# Patient Record
Sex: Female | Born: 1950 | Race: Black or African American | Hispanic: No | Smoking: Former smoker
Health system: Southern US, Community
[De-identification: ages and names within clinical notes are randomized; demographics above are authoritative.]

## PROBLEM LIST (undated history)

## (undated) DIAGNOSIS — E785 Hyperlipidemia, unspecified: Secondary | ICD-10-CM

## (undated) DIAGNOSIS — T7840XA Allergy, unspecified, initial encounter: Secondary | ICD-10-CM

## (undated) DIAGNOSIS — M1990 Unspecified osteoarthritis, unspecified site: Secondary | ICD-10-CM

## (undated) DIAGNOSIS — R011 Cardiac murmur, unspecified: Secondary | ICD-10-CM

## (undated) DIAGNOSIS — K219 Gastro-esophageal reflux disease without esophagitis: Secondary | ICD-10-CM

## (undated) DIAGNOSIS — Z862 Personal history of diseases of the blood and blood-forming organs and certain disorders involving the immune mechanism: Secondary | ICD-10-CM

## (undated) DIAGNOSIS — J45909 Unspecified asthma, uncomplicated: Secondary | ICD-10-CM

## (undated) DIAGNOSIS — I10 Essential (primary) hypertension: Secondary | ICD-10-CM

## (undated) DIAGNOSIS — Z8669 Personal history of other diseases of the nervous system and sense organs: Secondary | ICD-10-CM

## (undated) DIAGNOSIS — E119 Type 2 diabetes mellitus without complications: Secondary | ICD-10-CM

## (undated) DIAGNOSIS — J189 Pneumonia, unspecified organism: Secondary | ICD-10-CM

## (undated) HISTORY — DX: Essential (primary) hypertension: I10

## (undated) HISTORY — DX: Hyperlipidemia, unspecified: E78.5

## (undated) HISTORY — PX: COLONOSCOPY: SHX174

## (undated) HISTORY — DX: Unspecified asthma, uncomplicated: J45.909

## (undated) HISTORY — PX: HAND SURGERY: SHX662

## (undated) HISTORY — DX: Allergy, unspecified, initial encounter: T78.40XA

## (undated) HISTORY — DX: Unspecified osteoarthritis, unspecified site: M19.90

## (undated) HISTORY — PX: BILATERAL CARPAL TUNNEL RELEASE: SHX6508

## (undated) HISTORY — PX: TUBAL LIGATION: SHX77

---

## 1998-11-01 ENCOUNTER — Other Ambulatory Visit: Admission: RE | Admit: 1998-11-01 | Discharge: 1998-11-01 | Payer: Self-pay | Admitting: Internal Medicine

## 1998-11-29 ENCOUNTER — Encounter: Payer: Self-pay | Admitting: Internal Medicine

## 1998-11-29 ENCOUNTER — Encounter: Admission: RE | Admit: 1998-11-29 | Discharge: 1998-11-29 | Payer: Self-pay | Admitting: Internal Medicine

## 1999-01-03 ENCOUNTER — Encounter: Admission: RE | Admit: 1999-01-03 | Discharge: 1999-01-03 | Payer: Self-pay | Admitting: Internal Medicine

## 1999-01-03 ENCOUNTER — Encounter: Payer: Self-pay | Admitting: Internal Medicine

## 2000-12-09 ENCOUNTER — Other Ambulatory Visit: Admission: RE | Admit: 2000-12-09 | Discharge: 2000-12-09 | Payer: Self-pay

## 2000-12-11 ENCOUNTER — Encounter: Admission: RE | Admit: 2000-12-11 | Discharge: 2000-12-11 | Payer: Self-pay | Admitting: Internal Medicine

## 2000-12-11 ENCOUNTER — Encounter: Payer: Self-pay | Admitting: Internal Medicine

## 2002-01-14 ENCOUNTER — Encounter: Admission: RE | Admit: 2002-01-14 | Discharge: 2002-01-14 | Payer: Self-pay | Admitting: Internal Medicine

## 2002-01-14 ENCOUNTER — Encounter: Payer: Self-pay | Admitting: Internal Medicine

## 2002-02-09 ENCOUNTER — Ambulatory Visit (HOSPITAL_COMMUNITY): Admission: RE | Admit: 2002-02-09 | Discharge: 2002-02-09 | Payer: Self-pay | Admitting: Gastroenterology

## 2002-04-01 ENCOUNTER — Other Ambulatory Visit: Admission: RE | Admit: 2002-04-01 | Discharge: 2002-04-01 | Payer: Self-pay

## 2003-03-18 ENCOUNTER — Encounter: Admission: RE | Admit: 2003-03-18 | Discharge: 2003-03-18 | Payer: Self-pay | Admitting: Internal Medicine

## 2004-01-10 ENCOUNTER — Emergency Department (HOSPITAL_COMMUNITY): Admission: EM | Admit: 2004-01-10 | Discharge: 2004-01-11 | Payer: Self-pay | Admitting: Emergency Medicine

## 2004-05-16 ENCOUNTER — Encounter: Admission: RE | Admit: 2004-05-16 | Discharge: 2004-05-16 | Payer: Self-pay | Admitting: Internal Medicine

## 2004-08-29 ENCOUNTER — Other Ambulatory Visit: Admission: RE | Admit: 2004-08-29 | Discharge: 2004-08-29 | Payer: Self-pay | Admitting: Internal Medicine

## 2005-06-13 ENCOUNTER — Encounter: Admission: RE | Admit: 2005-06-13 | Discharge: 2005-06-13 | Payer: Self-pay | Admitting: Internal Medicine

## 2006-06-20 ENCOUNTER — Encounter: Admission: RE | Admit: 2006-06-20 | Discharge: 2006-06-20 | Payer: Self-pay | Admitting: Internal Medicine

## 2006-09-11 ENCOUNTER — Encounter: Admission: RE | Admit: 2006-09-11 | Discharge: 2006-09-11 | Payer: Self-pay | Admitting: Internal Medicine

## 2006-10-01 ENCOUNTER — Other Ambulatory Visit: Admission: RE | Admit: 2006-10-01 | Discharge: 2006-10-01 | Payer: Self-pay | Admitting: Obstetrics and Gynecology

## 2006-10-05 ENCOUNTER — Emergency Department (HOSPITAL_COMMUNITY): Admission: EM | Admit: 2006-10-05 | Discharge: 2006-10-06 | Payer: Self-pay | Admitting: Emergency Medicine

## 2006-12-02 ENCOUNTER — Ambulatory Visit (HOSPITAL_COMMUNITY): Admission: RE | Admit: 2006-12-02 | Discharge: 2006-12-02 | Payer: Self-pay | Admitting: Obstetrics and Gynecology

## 2006-12-02 ENCOUNTER — Encounter (INDEPENDENT_AMBULATORY_CARE_PROVIDER_SITE_OTHER): Payer: Self-pay | Admitting: Obstetrics and Gynecology

## 2008-01-06 ENCOUNTER — Encounter: Admission: RE | Admit: 2008-01-06 | Discharge: 2008-01-06 | Payer: Self-pay | Admitting: Internal Medicine

## 2008-06-19 ENCOUNTER — Emergency Department (HOSPITAL_COMMUNITY): Admission: EM | Admit: 2008-06-19 | Discharge: 2008-06-19 | Payer: Self-pay | Admitting: Emergency Medicine

## 2008-08-26 ENCOUNTER — Emergency Department (HOSPITAL_COMMUNITY): Admission: EM | Admit: 2008-08-26 | Discharge: 2008-08-26 | Payer: Self-pay | Admitting: Emergency Medicine

## 2009-01-26 ENCOUNTER — Encounter: Admission: RE | Admit: 2009-01-26 | Discharge: 2009-01-26 | Payer: Self-pay | Admitting: Internal Medicine

## 2010-01-27 ENCOUNTER — Encounter
Admission: RE | Admit: 2010-01-27 | Discharge: 2010-01-27 | Payer: Self-pay | Source: Home / Self Care | Attending: Internal Medicine | Admitting: Internal Medicine

## 2010-05-23 NOTE — Op Note (Signed)
NAME:  Isabella Oliver, ROA NO.:  [id number]   MEDICAL RECORD NO.:  [id number]          PATIENT TYPE:  AMB   LOCATION:  SDC                           FACILITY:  [facility]   PHYSICIAN:  Gerald Leitz, MD          DATE OF BIRTH:  [birth year]   DATE OF PROCEDURE:  12/02/2006  DATE OF DISCHARGE:                               OPERATIVE REPORT   PREOPERATIVE DIAGNOSIS:  Postmenopausal bleeding.   POSTOPERATIVE DIAGNOSIS:  Postmenopausal bleeding and endometrial  polyps.   PROCEDURE:  Hysteroscopy, polypectomy resection of endometrial polyp and  dilation and curettage.   SURGEON:  Dr. Gerald Leitz.   ASSISTANT:  None.   ANESTHESIA:  General.   FINDINGS:  Multiple endometrial polyps.   SPECIMEN:  Endometrial polyps and endometrial curettings.   DISPOSITION:  Specimen sent to pathology.   ESTIMATED BLOOD LOSS:  Minimal.   COMPLICATIONS:  None.   DISTENTION MEDIUM:  Sorbitol.   DEFICIT:  200 mL.   PROCEDURE:  The patient was taken to the operating room where she was  placed under general anesthesia.  She was placed in dorsal lithotomy  position.  She was then prepped and draped in the usual sterile fashion.  A bivalve speculum was placed into the vaginal vault.  The anterior lip  of the cervix was grasped with a single-tooth tenaculum.  The uterus was  sounded to 8.5 cm.  The cervix was dilated to #14 Hegar dilator.  Hysteroscope was inserted.  Multiple endometrial polyps were seen.  The  hysteroscope was removed.  Polypectomy forceps were inserted into the  uterus.  Multiple polyps were removed.  Hysteroscope was then  reinserted.  The patient was noted to have two remaining endometrial  polyps.  The hysteroscope was removed, the scope was then inserted and  the endometrial polyps were removed with a loop electrode.  No evidence  of uterine perforation was noted.  All instruments were removed from the  patient's vagina.  The patient was noted to have some bleeding from  the  single-tooth tenaculum site.  Silver nitrate was applied prior to  removing the tenaculum.  Quarter percent Marcaine was  injected at the 5 o'clock and 7 o'clock positions for postoperative  anesthesia.  The patient was awakened from anesthesia and taken to  recovery room awake and in stable condition.  Sponge, lap and needle  counts were correct x2.      Gerald Leitz, MD  Electronically Signed     TC/MEDQ  D:  12/02/2006  T:  12/02/2006  Job:  [id number]

## 2010-05-23 NOTE — H&P (Signed)
NAME:  Isabella Oliver, Isabella Oliver NO.:  [id number]   MEDICAL RECORD NO.:  [id number]          PATIENT TYPE:  AMB   LOCATION:  SDC                           [facility]:  [facility]   PHYSICIAN:  Gerald Leitz, MD          DATE OF BIRTH:  07-21-1950   DATE OF ADMISSION:  DATE OF DISCHARGE:                              HISTORY & PHYSICAL   HISTORY OF PRESENT ILLNESS:  This is a 60 year old G2, P2 with  postmenopausal bleeding.  She had a failed endometrial biopsy in the  office.  She had an ultrasound performed on October 09, 2006 that showed  the endometrium to be thick at 2.1 cm.  She has multiple uterine  fibroids.  The uterus measured 9.3 x 4.3 x 5.2 cm.  The left ovary  contained a complex cyst that was 1.3 cm and a simple cyst 1 cm,  both  avascular.  The right ovary was normal.   OB HISTORY:  Spontaneous vaginal delivery x2.   GYN HISTORY:  Menarche at the age of 51.  Contraception:  Tubal ligation  as well as postmenopausal.  The patient has been amenorrhea for several  years as well September 2008.   PAST MEDICAL HISTORY:  Hypertension.   PAST SURGICAL HISTORY:  Tubal ligation.   MEDICATIONS:  Lisinopril and hydrochlorothiazide.   ALLERGIES:  CODEINE causes vomiting.   SOCIAL HISTORY:  The patient is divorced.  She denies tobacco, alcohol  or illicit drug use.   FAMILY HISTORY:  Family history is positive for a grandmother with  breast cancer.  Negative for ovarian or colon cancer.   REVIEW OF SYSTEMS:  Review of systems is negative except as stated in  history of current illness.   PHYSICAL EXAMINATION:  VITAL SIGNS:  Blood pressure in the office today  is 158/84.  Weight 194 pounds.  CARDIOVASCULAR:  Regular rate and rhythm.  LUNGS:  Clear to auscultation bilaterally.  ABDOMEN:  Soft, nontender, nondistended.  No masses are appreciated.  EXTREMITIES:  No clubbing, cyanosis or edema.  PELVIC:  Normal external female genitalia.  No vulvar, vaginal, or  cervical  lesions are noted.  Bimanual exam reveals normal size uterus.  No adnexal masses or tenderness.   IMPRESSION AND PLAN:  1. Postmenopausal bleeding with thickened endometrium.  2. Failed endometrial biopsy in the office. Recommend hysteroscopy      dilatation and curettage.  Risks, benefits,      alternatives of surgery were discussed with the patient including      but not limited to infection, bleeding, possible uterine      perforation with the need for further surgery, risk of transfusion,      HIV, hepatitis B and C discussed.  The patient understands all      risks and desires to proceed with hysteroscopy dilation and      curettage.      Gerald Leitz, MD  Electronically Signed     TC/MEDQ  D:  11/28/2006  T:  11/29/2006  Job:  [id number]

## 2010-05-26 NOTE — Op Note (Signed)
   NAME:  Isabella Oliver, PEED                        ACCOUNT NO.:  [id number]   MEDICAL RECORD NO.:  [id number]                   PATIENT TYPE:  AMB   LOCATION:  ENDO                                 FACILITY:  [facility]   PHYSICIAN:  Danise Edge, M.D.                DATE OF BIRTH:  September 20, 1950   DATE OF PROCEDURE:  02/09/2002  DATE OF DISCHARGE:                                 OPERATIVE REPORT   REFERRING PHYSICIAN:  Dr. Kirby Funk.   PROCEDURE INDICATION:  Ms. [name] is a 60 year old female born  04-28-2050.  Ms. Tribbey underwent a colonoscopy five years ago and colon  polyps were removed.  She is scheduled to undergo a screening colonoscopy  with polypectomy to prevent colon cancer.   ENDOSCOPIST:  Danise Edge, M.D.   PREMEDICATION:  Versed 7.5 mg, Demerol 50 mg.   ENDOSCOPE:  Olympus adult colonoscope.   DESCRIPTION OF PROCEDURE:  After obtaining informed consent, [name] was  placed in the left lateral decubitus position.  I administered intravenous  Demerol and intravenous Versed to achieve conscious sedation for the  procedure.  The patient's blood pressure, oxygen saturation, and cardiac  rhythm were monitored throughout the procedure and documented in the medical  record.   Anal inspection was normal, digital rectal exam was normal and the Olympus  pediatric video colonoscope was introduced into the rectum and advanced to  the cecum.  The colonic preparation for the exam today was excellent.   Rectum normal.   Sigmoid colon and descending colon normal.   Splenic flexure normal.   Transverse colon normal.   Hepatic flexure normal.   Ascending colon normal.   Cecum and ileocecal valve normal.   ASSESSMENT:  Normal screening proctocolonoscopy to the cecum.    RECOMMENDATION:  Repeat colonoscopy in five years.                                                Danise Edge, M.D.    MJ/MEDQ  D:  02/09/2002  T:  02/09/2002  Job:  [id number]   cc:    Thora Lance, M.D.  [address]  [place]  [place] [address]  Fax: [phone]

## 2010-10-17 LAB — CBC
Hemoglobin: 12.2
MCV: 90.5
Platelets: 274
RBC: 3.93
RDW: 13.6

## 2010-10-17 LAB — URINALYSIS, ROUTINE W REFLEX MICROSCOPIC
Bilirubin Urine: NEGATIVE
Glucose, UA: NEGATIVE
Hgb urine dipstick: NEGATIVE
Nitrite: NEGATIVE

## 2010-10-17 LAB — DIFFERENTIAL
Basophils Absolute: 0
Basophils Relative: 0
Eosinophils Relative: 5
Lymphocytes Relative: 38
Neutro Abs: 3.4

## 2010-10-17 LAB — BASIC METABOLIC PANEL
Calcium: 10.1
Chloride: 103
Creatinine, Ser: 0.64
GFR calc non Af Amer: >60
Glucose, Bld: 90
Potassium: 4.2

## 2010-10-19 LAB — CBC
MCHC: 33.7
MCV: 90.6
RBC: 3.95
RDW: 13.5
WBC: 6.2

## 2010-10-19 LAB — I-STAT 8, (EC8 V) (CONVERTED LAB)
Bicarbonate: 30.5 — ABNORMAL HIGH
HCT: 39
Hemoglobin: 13.3
Potassium: 3.4 — ABNORMAL LOW
Sodium: 140
pH, Ven: 7.35 — ABNORMAL HIGH

## 2010-10-19 LAB — DIFFERENTIAL
Eosinophils Relative: 7 — ABNORMAL HIGH
Lymphs Abs: 2.1
Monocytes Relative: 8
Neutro Abs: 3.2

## 2010-10-19 LAB — POCT I-STAT CREATININE: Creatinine, Ser: 0.8

## 2010-12-20 ENCOUNTER — Other Ambulatory Visit (HOSPITAL_COMMUNITY)
Admission: RE | Admit: 2010-12-20 | Discharge: 2010-12-20 | Disposition: A | Discharge status: Home / Self Care | Payer: BC Managed Care – PPO | Source: Ambulatory Visit | Attending: Internal Medicine | Admitting: Internal Medicine

## 2010-12-20 ENCOUNTER — Other Ambulatory Visit: Payer: Self-pay | Admitting: Internal Medicine

## 2010-12-20 DIAGNOSIS — Z01419 Encounter for gynecological examination (general) (routine) without abnormal findings: Secondary | ICD-10-CM | POA: Insufficient documentation

## 2010-12-20 DIAGNOSIS — Z1159 Encounter for screening for other viral diseases: Secondary | ICD-10-CM | POA: Insufficient documentation

## 2011-01-09 ENCOUNTER — Ambulatory Visit (INDEPENDENT_AMBULATORY_CARE_PROVIDER_SITE_OTHER): Payer: BC Managed Care – PPO

## 2011-01-09 DIAGNOSIS — J209 Acute bronchitis, unspecified: Secondary | ICD-10-CM

## 2011-01-09 DIAGNOSIS — J029 Acute pharyngitis, unspecified: Secondary | ICD-10-CM

## 2011-01-09 DIAGNOSIS — R0602 Shortness of breath: Secondary | ICD-10-CM

## 2011-01-09 DIAGNOSIS — J45909 Unspecified asthma, uncomplicated: Secondary | ICD-10-CM

## 2011-01-26 ENCOUNTER — Ambulatory Visit (INDEPENDENT_AMBULATORY_CARE_PROVIDER_SITE_OTHER): Payer: BC Managed Care – PPO

## 2011-01-26 DIAGNOSIS — J40 Bronchitis, not specified as acute or chronic: Secondary | ICD-10-CM

## 2011-01-26 DIAGNOSIS — J9801 Acute bronchospasm: Secondary | ICD-10-CM

## 2011-01-26 DIAGNOSIS — R05 Cough: Secondary | ICD-10-CM

## 2011-02-01 ENCOUNTER — Other Ambulatory Visit: Payer: Self-pay | Admitting: Gastroenterology

## 2011-04-20 ENCOUNTER — Other Ambulatory Visit: Payer: Self-pay | Admitting: Internal Medicine

## 2011-04-20 DIAGNOSIS — Z1231 Encounter for screening mammogram for malignant neoplasm of breast: Secondary | ICD-10-CM

## 2011-05-25 ENCOUNTER — Ambulatory Visit
Admission: RE | Admit: 2011-05-25 | Discharge: 2011-05-25 | Disposition: A | Discharge status: Home / Self Care | Payer: BC Managed Care – PPO | Source: Ambulatory Visit | Attending: Internal Medicine | Admitting: Internal Medicine

## 2011-05-25 DIAGNOSIS — Z1231 Encounter for screening mammogram for malignant neoplasm of breast: Secondary | ICD-10-CM

## 2012-03-05 ENCOUNTER — Other Ambulatory Visit: Payer: Self-pay | Admitting: Radiology

## 2012-04-28 ENCOUNTER — Other Ambulatory Visit: Payer: Self-pay

## 2012-04-28 DIAGNOSIS — Z1231 Encounter for screening mammogram for malignant neoplasm of breast: Secondary | ICD-10-CM

## 2012-06-06 ENCOUNTER — Ambulatory Visit
Admission: RE | Admit: 2012-06-06 | Discharge: 2012-06-06 | Disposition: A | Discharge status: Home / Self Care | Payer: BC Managed Care – PPO | Source: Ambulatory Visit

## 2012-06-06 DIAGNOSIS — Z1231 Encounter for screening mammogram for malignant neoplasm of breast: Secondary | ICD-10-CM

## 2012-06-19 ENCOUNTER — Ambulatory Visit (INDEPENDENT_AMBULATORY_CARE_PROVIDER_SITE_OTHER): Payer: BC Managed Care – PPO | Admitting: Emergency Medicine

## 2012-06-19 ENCOUNTER — Ambulatory Visit: Payer: BC Managed Care – PPO

## 2012-06-19 VITALS — BP 148/52 | HR 76 | Temp 98.1°F | Resp 16 | Ht 62.5 in | Wt 204.6 lb

## 2012-06-19 DIAGNOSIS — R0602 Shortness of breath: Secondary | ICD-10-CM

## 2012-06-19 DIAGNOSIS — S46812A Strain of other muscles, fascia and tendons at shoulder and upper arm level, left arm, initial encounter: Secondary | ICD-10-CM

## 2012-06-19 DIAGNOSIS — S43499A Other sprain of unspecified shoulder joint, initial encounter: Secondary | ICD-10-CM

## 2012-06-19 DIAGNOSIS — M940 Chondrocostal junction syndrome [Tietze]: Secondary | ICD-10-CM

## 2012-06-19 MED ORDER — CYCLOBENZAPRINE HCL 10 MG PO TABS: 10.0000 mg | ORAL_TABLET | Freq: Three times a day (TID) | ORAL | Status: DC | PRN

## 2012-06-19 MED ORDER — HYDROCOD POLST-CHLORPHEN POLST 10-8 MG/5ML PO LQCR: 5.0000 mL | Freq: Two times a day (BID) | ORAL | Status: DC | PRN

## 2012-06-19 MED ORDER — MELOXICAM 15 MG PO TABS: 15.0000 mg | ORAL_TABLET | Freq: Every day | ORAL | Status: DC

## 2012-06-19 NOTE — Progress Notes (Signed)
[facility] [address], [place] [address] [phone]  Date:  06/19/2012   Name:  Isabella Oliver   DOB:  10/21/1950   MRN:  [id number]  PCP:  No primary provider on file.    Chief Complaint: Shortness of Breath, Back Pain and Headache   History of Present Illness:  Isabella Oliver is a 62 y.o. very pleasant female patient who presents with the following:  Shortness of breath since exposure to chemicals used by plumbers on Monday in the bathroom.  No cough, wheezing.  Has exertional shortness of breath that has persisted.  No nausea or vomiting.  Developed chest pain that radiates into the left shoulder and shoulder blade.  Says the pain lasted 2 1/2 hours last night and recurred today .  denies provocative or relieving factors.  No history of prior CAD.  No orthopnea or PND.  Non smoker (reformed) has HBP and no DM.  No improvement with over the counter medications or other home remedies. Denies other complaint or health concern today.   There are no active problems to display for this patient.   Past Medical History  Diagnosis Date  . Allergy   . Arthritis   . Asthma   . Hypertension     Past Surgical History  Procedure Laterality Date  . Tubal ligation      History  Substance Use Topics  . Smoking status: Never Smoker   . Smokeless tobacco: Not on file  . Alcohol Use: No    Family History  Problem Relation Age of Onset  . Lung disease Mother   . Hypertension Father   . Cancer Sister   . Diabetes Sister   . Diabetes Brother   . Heart disease Brother   . Cancer Maternal Grandmother   . Cancer Maternal Grandfather     Allergies  Allergen Reactions  . Codeine Nausea Only  . Methocarbamol Nausea Only  . Tramadol Nausea Only    Medication list has been reviewed and updated.  No current outpatient prescriptions on file prior to visit.   No current facility-administered medications on file prior to visit.    Review of  Systems:  As per HPI, otherwise negative.    Physical Examination: Filed Vitals:   06/19/12 1515  BP: 148/52  Pulse: 76  Temp: 98.1 F (36.7 C)  Resp: 16   Filed Vitals:   06/19/12 1515  Height: 5' 2.5" (1.588 m)  Weight: 204 lb 9.6 oz (92.806 kg)   Body mass index is 36.8 kg/(m^2). Ideal Body Weight: Weight in (lb) to have BMI = 25: 138.6  GEN: overweight, NAD, Non-toxic, A & O x 3 HEENT: Atraumatic, Normocephalic. Neck supple. No masses, No LAD. NECK:  Tender trapezius on left reproduces back pain. Ears and Nose: No external deformity. CV: RRR, No M/G/R. No JVD. No thrill. No extra heart sounds. PULM: CTA B, no wheezes, crackles, rhonchi. No retractions. No resp. distress. No accessory muscle use. CHEST:  Tender parasternal margin on left no crepitus.  Says this reproduces her pain. ABD: S, NT, ND, +BS. No rebound. No HSM. EXTR: No c/c/e NEURO Normal gait.  PSYCH: Normally interactive. Conversant. Not depressed or anxious appearing.  Calm demeanor.    Assessment and Plan: Costochondritis Trapezius strain mobic Flexeril vicodin  Signed,  Phillips Odor, MD   [facility] reading (PRIMARY) by  Dr. Dareen Piano.  Chest unremarkable.

## 2012-06-19 NOTE — Patient Instructions (Addendum)
Costochondritis Costochondritis (Tietze syndrome), or costochondral separation, is a swelling and irritation (inflammation) of the tissue (cartilage) that connects your ribs with your breastbone (sternum). It may occur on its own (spontaneously), through damage caused by an accident (trauma), or simply from coughing or minor exercise. It may take up to 6 weeks to get better and longer if you are unable to be conservative in your activities. HOME CARE INSTRUCTIONS   Avoid exhausting physical activity. Try not to strain your ribs during normal activity. This would include any activities using chest, belly (abdominal), and side muscles, especially if heavy weights are used.  Use ice for 15-20 minutes per hour while awake for the first 2 days. Place the ice in a plastic bag, and place a towel between the bag of ice and your skin.  Only take over-the-counter or prescription medicines for pain, discomfort, or fever as directed by your caregiver. SEEK IMMEDIATE MEDICAL CARE IF:   Your pain increases or you are very uncomfortable.  You have a fever.  You develop difficulty with your breathing.  You cough up blood.  You develop worse chest pains, shortness of breath, sweating, or vomiting.  You develop new, unexplained problems (symptoms). MAKE SURE YOU:   Understand these instructions.  Will watch your condition.  Will get help right away if you are not doing well or get worse. Document Released: 10/04/2004 Document Revised: 03/19/2011 Document Reviewed: 08/13/2007 ExitCare Patient Information 2014 [employer].  

## 2012-08-19 ENCOUNTER — Ambulatory Visit: Payer: BC Managed Care – PPO

## 2012-08-19 ENCOUNTER — Ambulatory Visit (INDEPENDENT_AMBULATORY_CARE_PROVIDER_SITE_OTHER): Payer: BC Managed Care – PPO | Admitting: Emergency Medicine

## 2012-08-19 VITALS — BP 136/82 | HR 56 | Temp 98.0°F | Resp 20 | Ht 62.5 in | Wt 198.0 lb

## 2012-08-19 DIAGNOSIS — M25562 Pain in left knee: Secondary | ICD-10-CM

## 2012-08-19 DIAGNOSIS — M25569 Pain in unspecified knee: Secondary | ICD-10-CM

## 2012-08-19 MED ORDER — MELOXICAM 15 MG PO TABS: 15.0000 mg | ORAL_TABLET | Freq: Every day | ORAL | Status: DC

## 2012-08-19 MED ORDER — HYDROCODONE-ACETAMINOPHEN 5-325 MG PO TABS: 1.0000 | ORAL_TABLET | ORAL | Status: DC | PRN

## 2012-08-19 NOTE — Progress Notes (Signed)
[facility] [address] [place] [address] [phone]  Date:  08/19/2012   Name:  Isabella Oliver   DOB:  Jan 27, 1950   MRN:  [id number]  PCP:  No primary provider on file.    Chief Complaint: Knee Pain   History of Present Illness:  MICHAEL VENTRESCA is a 62 y.o. very pleasant female patient who presents with the following:  Says that she was in the shower and lifted her right leg to wash and her left knee "gave way" and she fell into the tub.  She says she did not hit her head or have a LOC.  No neuro or visual symptoms.  No syncope.  Did not strike her left knee.  She is being evaluated for arthritis by her FMD and he has discussed referral with her to a rheumatologist. No improvement with over the counter medications or other home remedies. Denies other complaint or health concern today.   She fell last night.    There are no active problems to display for this patient.   Past Medical History  Diagnosis Date  . Allergy   . Arthritis   . Asthma   . Hypertension     Past Surgical History  Procedure Laterality Date  . Tubal ligation      History  Substance Use Topics  . Smoking status: Never Smoker   . Smokeless tobacco: Not on file  . Alcohol Use: No    Family History  Problem Relation Age of Onset  . Lung disease Mother   . Hypertension Father   . Cancer Sister   . Diabetes Sister   . Diabetes Brother   . Heart disease Brother   . Cancer Maternal Grandmother   . Cancer Maternal Grandfather     Allergies  Allergen Reactions  . Codeine Nausea Only  . Methocarbamol Nausea Only  . Tramadol Nausea Only    Medication list has been reviewed and updated.  Current Outpatient Prescriptions on File Prior to Visit  Medication Sig Dispense Refill  . amLODipine (NORVASC) 10 MG tablet Take 10 mg by mouth daily.      Marland Kitchen aspirin 81 MG tablet Take 81 mg by mouth daily.      Marland Kitchen VALSARTAN PO Take 1 tablet by mouth daily.      .  chlorpheniramine-HYDROcodone (TUSSIONEX PENNKINETIC ER) 10-8 MG/5ML LQCR Take 5 mLs by mouth every 12 (twelve) hours as needed.  60 mL  0  . cyclobenzaprine (FLEXERIL) 10 MG tablet Take 1 tablet (10 mg total) by mouth 3 (three) times daily as needed for muscle spasms.  30 tablet  0  . meloxicam (MOBIC) 15 MG tablet Take 1 tablet (15 mg total) by mouth daily.  30 tablet  1   No current facility-administered medications on file prior to visit.    Review of Systems:  As per HPI, otherwise negative.    Physical Examination: Filed Vitals:   08/19/12 1024  BP: 136/82  Pulse: 56  Temp: 98 F (36.7 C)  Resp: 20   Filed Vitals:   08/19/12 1024  Height: 5' 2.5" (1.588 m)  Weight: 198 lb (89.812 kg)   Body mass index is 35.62 kg/(m^2). Ideal Body Weight: Weight in (lb) to have BMI = 25: 138.6   GEN: WDWN, NAD, Non-toxic, Alert & Oriented x 3 HEENT: Atraumatic, Normocephalic.  Ears and Nose: No external deformity. EXTR: No clubbing/cyanosis/edema NEURO: antalgic assisted gait.  PSYCH: Normally interactive. Conversant. Not  depressed or anxious appearing.  Calm demeanor.  LEFT knee:  Little effusion.  No deformity or ecchymosis.  Little focal tender. Joint stable.    Assessment and Plan: Knee pain Crutches mobic Ice and elevation   Signed,  Phillips Odor, MD   [facility] reading (PRIMARY) by  Dr. Dareen Piano.  DJD.

## 2012-08-19 NOTE — Patient Instructions (Signed)
Wear and Tear Disorders of the Knee (Arthritis, Osteoarthritis)  Everyone will experience wear and tear injuries (arthritis, osteoarthritis) of the knee. These are the changes we all get as we age. They come from the joint stress of daily living. The amount of cartilage damage in your knee and your symptoms determine if you need surgery. Mild problems require approximately two months recovery time. More severe problems take several months to recover. With mild problems, your surgeon may find worn and rough cartilage surfaces. With severe changes, your surgeon may find cartilage that has completely worn away and exposed the bone. Loose bodies of bone and cartilage, bone spurs (excess bone growth), and injuries to the menisci (cushions between the large bones of your leg) are also common. All of these problems can cause pain.  For a mild wear and tear problem, rough cartilage may simply need to be shaved and smoothed. For more severe problems with areas of exposed bone, your surgeon may use an instrument for roughing up the bone surfaces to stimulate new cartilage growth. Loose bodies are usually removed. Torn menisci may be trimmed or repaired.  ABOUT THE ARTHROSCOPIC PROCEDURE  Arthroscopy is a surgical technique. It allows your orthopedic surgeon to diagnose and treat your knee injury with accuracy. The surgeon looks into your knee through a small scope. The scope is like a small (pencil-sized) telescope. Arthroscopy is less invasive than open knee surgery. You can expect a more rapid recovery. After the procedure, you will be moved to a recovery area until most of the effects of the medication have worn off. Your caregiver will discuss the test results with you.  RECOVERY  The severity of the arthritis and the type of procedure performed will determine recovery time. Other important factors include age, physical condition, medical conditions, and the type of rehabilitation program. Strengthening your muscles after  arthroscopy helps guarantee a better recovery. Follow your caregiver's instructions. Use crutches, rest, elevate, ice, and do knee exercises as instructed. Your caregivers will help you and instruct you with exercises and other physical therapy required to regain your mobility, muscle strength, and functioning following surgery. Only take over-the-counter or prescription medicines for pain, discomfort, or fever as directed by your caregiver.   SEEK MEDICAL CARE IF:   · There is increased bleeding (more than a small spot) from the wound.  · You notice redness, swelling, or increasing pain in the wound.  · Pus is coming from wound.  · You develop an unexplained oral temperature above 102° F (38.9° C) , or as your caregiver suggests.  · You notice a foul smell coming from the wound or dressing.  · You have severe pain with motion of the knee.  SEEK IMMEDIATE MEDICAL CARE IF:   · You develop a rash.  · You have difficulty breathing.  · You have any allergic problems.  MAKE SURE YOU:   · Understand these instructions.  · Will watch your condition.  · Will get help right away if you are not doing well or get worse.  Document Released: 12/23/1999 Document Revised: 03/19/2011 Document Reviewed: 05/21/2007  ExitCare® Patient Information ©2014 [employer].

## 2012-08-19 NOTE — Progress Notes (Signed)
  Subjective:    Patient ID: Isabella Oliver, female    DOB: 1950-10-29, 62 y.o.   MRN: [id number]  HPI  62 YO female presents to the clinic today with complaints of left knee pain. Pt was in the shower last night and felt the left knee give way. She fell into the tub. She did not hit her head nor lose consciousness. She does not recall hitting her knees. This morning she has pain in her left knee. Unable to bear weight. Unable to walk.   Patient has a history of knee pains. She was going to see her PCP- Dr. Valentina Lucks for evaluation. She has not scheduled this appt as of today's visit.  Review of Systems     Objective:   Physical Exam        Assessment & Plan:  Xray Left Knee

## 2012-08-25 ENCOUNTER — Encounter: Payer: Self-pay | Admitting: Radiology

## 2012-08-26 ENCOUNTER — Encounter: Payer: BC Managed Care – PPO | Attending: Internal Medicine | Admitting: Dietician

## 2012-08-26 ENCOUNTER — Encounter: Payer: Self-pay | Admitting: Dietician

## 2012-08-26 VITALS — Ht 62.5 in | Wt 200.4 lb

## 2012-08-26 DIAGNOSIS — E669 Obesity, unspecified: Secondary | ICD-10-CM

## 2012-08-26 DIAGNOSIS — Z713 Dietary counseling and surveillance: Secondary | ICD-10-CM | POA: Insufficient documentation

## 2012-08-26 DIAGNOSIS — E119 Type 2 diabetes mellitus without complications: Secondary | ICD-10-CM | POA: Insufficient documentation

## 2012-08-26 NOTE — Progress Notes (Signed)
  Medical Nutrition Therapy:  Appt start time: 0800 end time:  0900.   Assessment:  Primary concerns today: Doctor recommend that Isabella Oliver talked to a a dietitian about pre-diabetes. Patient was told she was on the border of having diabetes and recent A1C values were 6.9 and 7.2%.  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 States that she does her grocery shopping and food preparation and is not currently exercising. Also states that she gets very tired after eating and usually goes to sleep after eating.   MEDICATIONS: See list   DIETARY INTAKE:  Usual eating pattern includes 2 meals and 2 snacks per day.  Avoided foods include cucumbers, bananas.    24-hr recall:  B ( AM): sandwich with bologna or spam and tomato soup with coffee with cream and sugar and water  Snk ( AM): none  L ( 3-4PM): smoked neck bones, black eyed peas, and cornbread OR chicken with soda or lemonade Snk ( PM): none D ( PM): none Snk (1 AM-5:30 AM):bag of chips and candy bar Beverages: water and gatorade  Usual physical activity: none  Estimated energy needs: 1600 calories 180 g carbohydrates 120 g protein 44 g fat  Progress Towards Goal(s):  In progress.   Nutritional Diagnosis:  NB-1.1 Food and nutrition-related knowledge deficit As related to high salt, high fat, and energy dense food choices.  As evidenced by BMI of 36.07 and impaired fasting glucose.    Intervention:  Nutrition counseling provided. Briefly described the pathophysiology of insulin resistance/Type 2 diabetes and explained the importance of spreading out carbohydrates throughout the day. Also explained the role of physical activity in reducing blood glucose levels. Advised her to fill half of her plate with vegetables, reduce her intake of processed meats, and eat snacks with protein and CHO between meals to keep blood glucose level and reduce overeating at meals.   Contacted [name]'s primary care doctor in  order to get a prescription for glucose monitoring. Santanna will contact [facility] to make a 30 minute appointment to be trained on BGM.   Plan: Eat every 3-5 hours that you are awake.  Have snacks with protein and carbohydrates.  Switch drinks like soda and lemonade to sugar-free or water. Try to get at least 10 minutes of exercise in each day. Fill half of your plates with vegetables, limit starches to a quarter of the plate. Choose meats that are less processed, lower in sodium, and lower in fat.  Handouts given during visit include:  Living Well With Diabetes  MyPlate Handout  16X CHO  Monitoring/Evaluation:  Dietary intake, exercise, and body weight in 4 week(s).

## 2012-08-26 NOTE — Patient Instructions (Addendum)
Eat every 3-5 hours that you are awake.  Have snacks with protein and carbohydrates.  Switch drinks like soda and lemonade to sugar-free or water. Try to get at least 10 minutes of exercise in each day. Fill half of your plates with vegetables, limit starches to a quarter of the plate. Choose meats that are less processed, lower in sodium, and lower in fat.

## 2012-09-01 ENCOUNTER — Encounter: Payer: BC Managed Care – PPO | Admitting: *Deleted

## 2012-09-01 DIAGNOSIS — E119 Type 2 diabetes mellitus without complications: Secondary | ICD-10-CM

## 2012-09-01 NOTE — Patient Instructions (Addendum)
Plan: Call your MD for Rx for AccuChek Smart View strips and Accu Chek Fast Clix Drums Consider checking your BG once a day for now, and alternate between pre and post meals

## 2012-09-01 NOTE — Progress Notes (Signed)
Blood Glucose Monitoring Instruction  Appointment Start Time: 0830 Appointment End Time: 0900  Assessment:  Primary concerns today: Patient here for instruction on Blood Glucose Monitoring. They do not have their own meter at this time.  Meter Provided:   Yes  If Yes, Brand: Accu Chek Nano BG Monitoring Kit Lot #: [id number] Expiration Date:12/07/13  Medications: see list     Intervention:    Explained rationale of testing BG to obtain data as to how their diabetes is being managed.  Provided Target Ranges for both pre and post meals  Explained factors that effect BG including food (carbohydrate), stress, activity level and insulin availability in the body including diabetes medications  Taught patient techniques for using BG monitor and lancing device  Discussed need for Rx for strips and lancets   Explained rationale of recording BG both for patient and MD to assess patterns as needed.  Plan: Call your MD for Rx for AccuChek Smart View strips and Accu Chek Fast Clix Drums Consider checking your BG once a day for now, and alternate between pre and post meals   Follow Up: Patient offered follow up as needed

## 2012-09-22 ENCOUNTER — Encounter: Payer: BC Managed Care – PPO | Attending: Internal Medicine | Admitting: Dietician

## 2012-09-22 VITALS — Ht 62.5 in | Wt 197.5 lb

## 2012-09-22 DIAGNOSIS — E119 Type 2 diabetes mellitus without complications: Secondary | ICD-10-CM | POA: Insufficient documentation

## 2012-09-22 DIAGNOSIS — Z713 Dietary counseling and surveillance: Secondary | ICD-10-CM | POA: Insufficient documentation

## 2012-09-22 DIAGNOSIS — E669 Obesity, unspecified: Secondary | ICD-10-CM

## 2012-09-22 NOTE — Progress Notes (Signed)
  Medical Nutrition Therapy:  Appt start time: 0800 end time:  0900.   Assessment:  Isabella Oliver is here today for a follow up for pre-diabetes and lost 2.5 pounds since her last visit. She is now testing her blood glucose about 1 x day, though she skipped some days recently since she had trouble changing the lancet on her meter. Average blood sugar is 130 fasting.   Having one soda per day at dinner and drinks G2 gatorade (20z) per day. Using small plates and trying to fill half of your plate with vegetables. Plans to start walking at the track this week.    Wt Readings from Last 3 Encounters:  09/22/12 197 lb 8 oz (89.585 kg)  08/26/12 200 lb 6.4 oz (90.901 kg)  08/19/12 198 lb (89.812 kg)   Ht Readings from Last 3 Encounters:  09/22/12 5' 2.5" (1.588 m)  08/26/12 5' 2.5" (1.588 m)  08/19/12 5' 2.5" (1.588 m)   Body mass index is 35.53 kg/(m^2). @BMIFA @ Normalized weight-for-age data available only for age 91 to 20 years. Normalized stature-for-age data available only for age 91 to 20 years.  MEDICATIONS: See list   DIETARY INTAKE:  Usual eating pattern includes 2 meals and 2 snacks per day.  Avoided foods include cucumbers, bananas.    24-hr recall:  B ( AM):salad with spam and cheese and bacon bits and macaroni OR sandwich with bologna or spam and tomato soup with coffee with cream and sugar (not everyday) and water  Snk ( AM): none  L ( 5 PM): chicken pie and mixed greens with peach cobbler Or cabbage with fat back and cornbread with soda Snk ( PM): none Snk (1 AM-5:30 AM):grapes AND  crackers and peanut butter Or chips Beverages: water and gatorade and one soda per day  Usual physical activity: none  Estimated energy needs: 1600 calories 180 g carbohydrates 120 g protein 44 g fat  Progress Towards Goal(s):  In progress.   Nutritional Diagnosis:  NB-1.1 Food and nutrition-related knowledge deficit As related to high salt, high fat, and energy dense food choices.  As  evidenced by BMI of 36.07 and impaired fasting glucose.    Intervention:  Nutrition counseling provided. Encouraged Isabella Oliver to keep up the good work from last time, check blood sugar at different times of the day, and work on incorporating exercise into her routine.   Plan: Check your blood sugar at different throughout the week and write down what you ate or if anything else that may have affected blood sugar.  Eat every 3-5 hours that you are awake if you are hungry Have snacks with protein and carbohydrates.  Switch drinks like soda and lemonade to sugar-free or water. Try to get at least 10 minutes of exercise in each day. (try the track after school) Continue fill half of your plates with vegetables, limit starches to a quarter of the plate. Choose meats that are less processed, lower in sodium, and lower in fat.   Monitoring/Evaluation:  Dietary intake, exercise, and body weight in 2 month(s).

## 2012-09-22 NOTE — Patient Instructions (Addendum)
Check your blood sugar at different throughout the week and write down what you ate or if anything else that may have affected blood sugar.  Eat every 3-5 hours that you are awake if you are hungry Have snacks with protein and carbohydrates.  Switch drinks like soda and lemonade to sugar-free or water. Try to get at least 10 minutes of exercise in each day. (try the track after school) Continue fill half of your plates with vegetables, limit starches to a quarter of the plate. Choose meats that are less processed, lower in sodium, and lower in fat.

## 2012-09-23 ENCOUNTER — Ambulatory Visit: Payer: BC Managed Care – PPO | Admitting: Dietician

## 2012-11-03 IMAGING — CR DG KNEE*R* EXAM
4 series · 4 of 4 positions shown · non-contrast
Comparison: none

[AP]
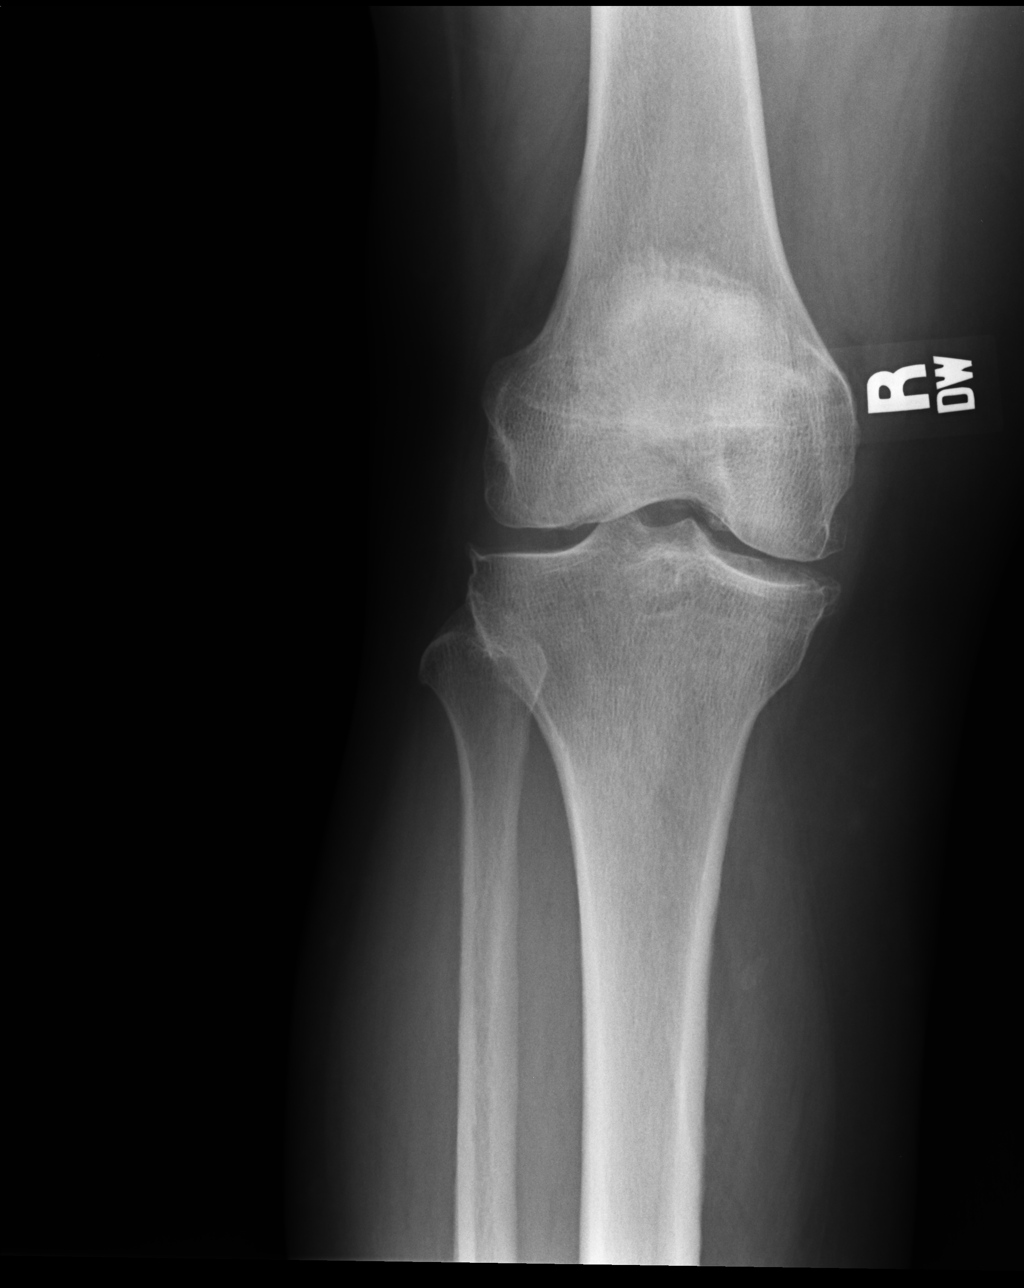

[lateral]
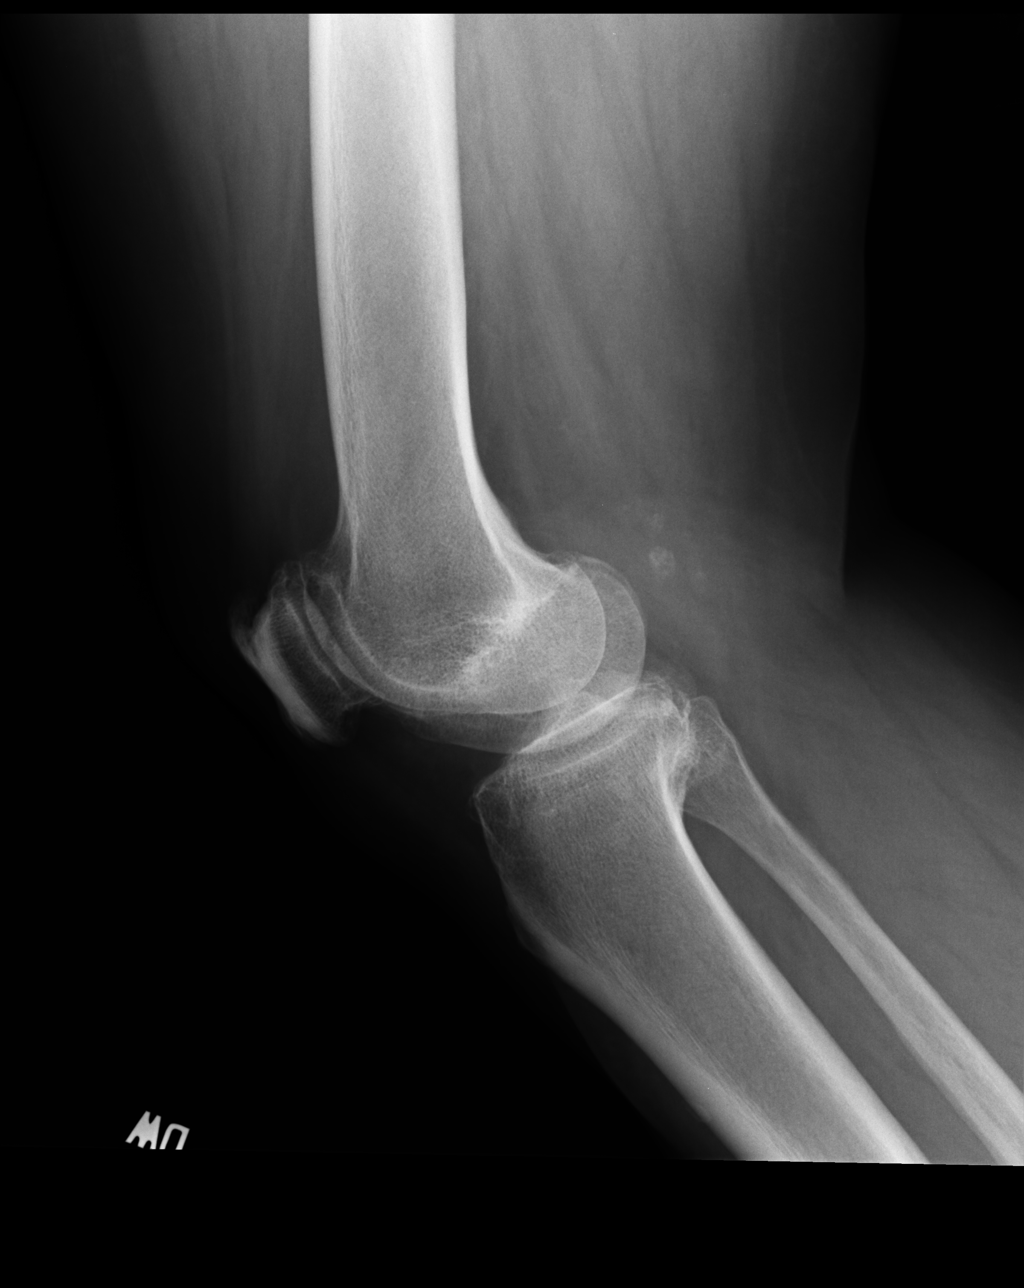

[pa axial]
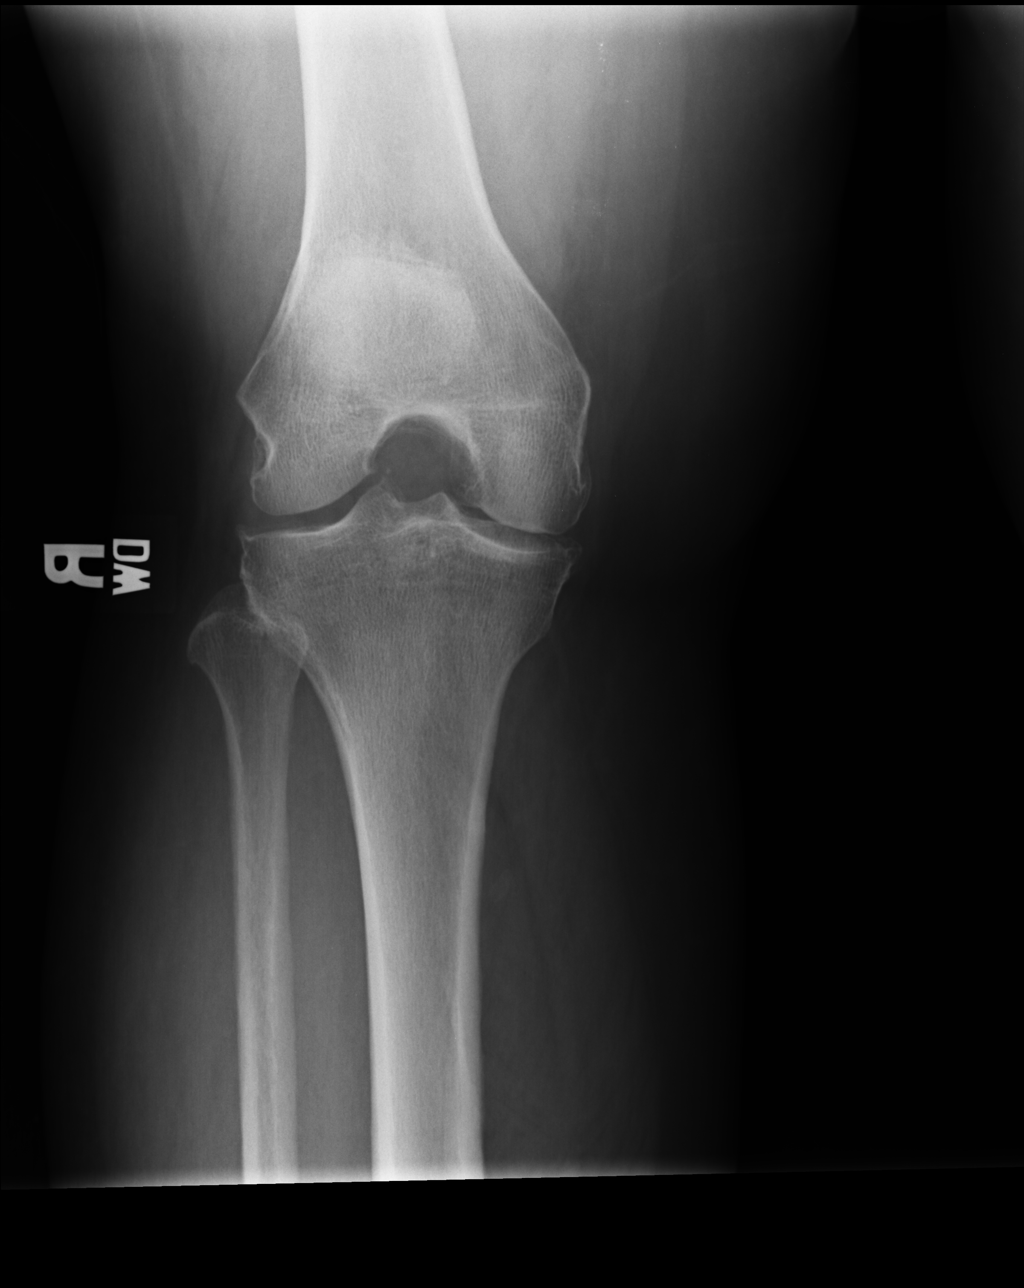

[sunrise]
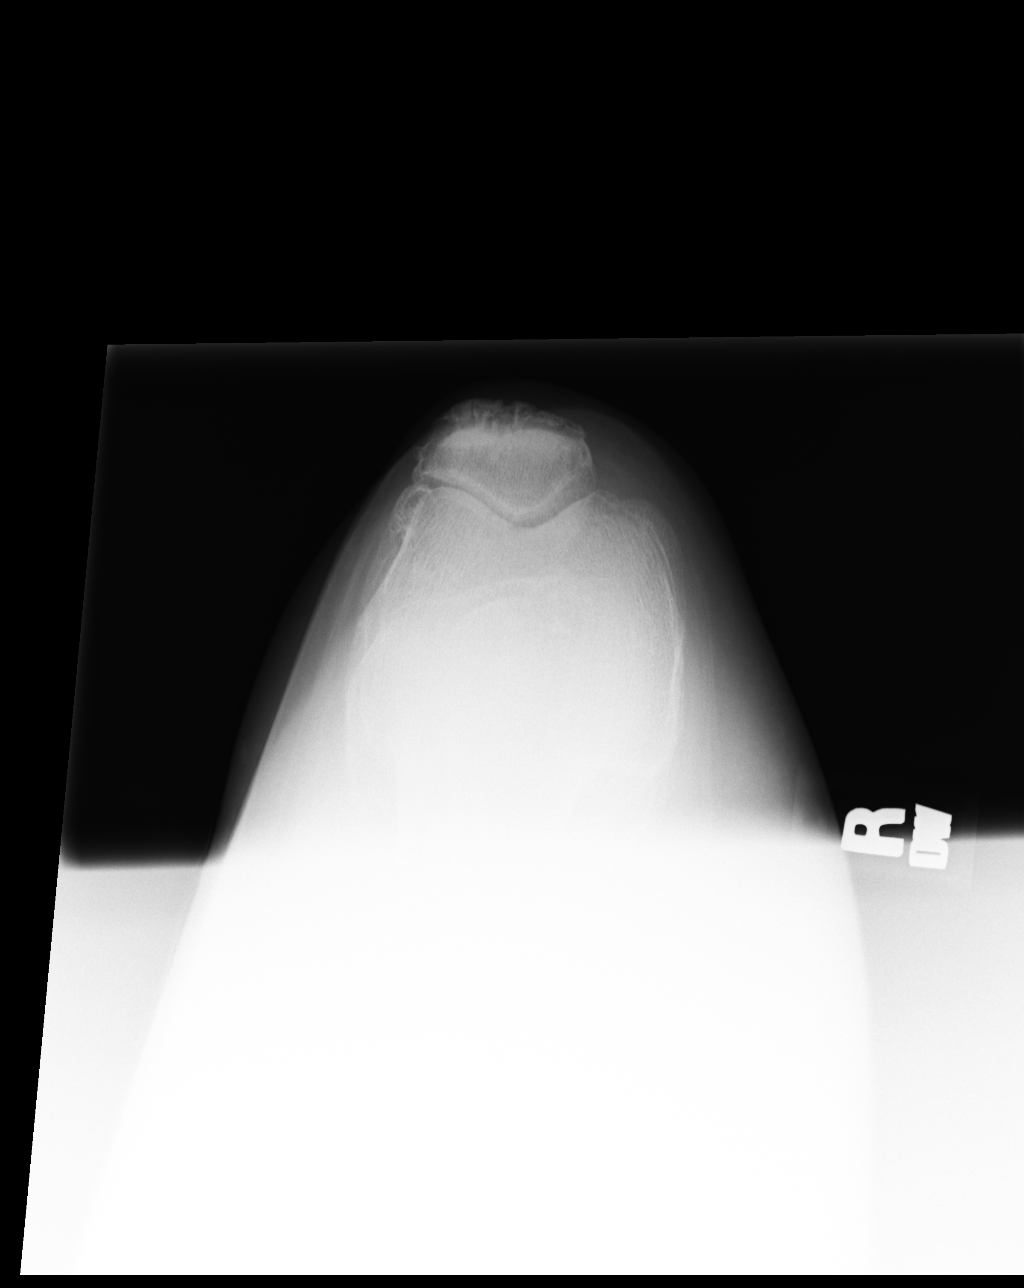

[4 of 4 positions shown; findings below may reference images not displayed]

Canned report from images found in remote index.

Refer to host system for actual result text.

## 2012-11-24 ENCOUNTER — Encounter: Payer: BC Managed Care – PPO | Attending: Internal Medicine | Admitting: Dietician

## 2012-11-24 VITALS — Ht 62.5 in | Wt 195.2 lb

## 2012-11-24 DIAGNOSIS — E119 Type 2 diabetes mellitus without complications: Secondary | ICD-10-CM | POA: Insufficient documentation

## 2012-11-24 DIAGNOSIS — Z713 Dietary counseling and surveillance: Secondary | ICD-10-CM | POA: Insufficient documentation

## 2012-11-24 NOTE — Patient Instructions (Signed)
Check your blood sugar at different throughout the week and write down what you ate or if anything else that may have affected blood sugar.  Eat every 3-5 hours that you are awake if you are hungry Have snacks with protein and carbohydrates.  Switch drinks like soda and lemonade to sugar-free or water. Try to get at least 10 minutes of exercise in each day. (try the track after school) Continue fill half of your plates with vegetables, limit starches to a quarter of the plate. Choose meats that are less processed, lower in sodium, and lower in fat.

## 2012-11-24 NOTE — Progress Notes (Signed)
  Medical Nutrition Therapy:  Appt start time: 0830 end time:  0900.  Assessment:  Isabella Oliver is here today for a follow up for pre-diabetes. Lost 2 pounds since last visit. Unable to exercise d/t arthritis. Not going back for seconds and thirds like before, though eating most of the same foods. Got a new meter from the pharmacy and has had trouble work it so she hasn't been able to test.     Wt Readings from Last 3 Encounters:  11/24/12 195 lb 3.2 oz (88.542 kg)  09/22/12 197 lb 8 oz (89.585 kg)  08/26/12 200 lb 6.4 oz (90.901 kg)   Ht Readings from Last 3 Encounters:  11/24/12 5' 2.5" (1.588 m)  09/22/12 5' 2.5" (1.588 m)  08/26/12 5' 2.5" (1.588 m)   Body mass index is 35.11 kg/(m^2). @BMIFA @ Normalized weight-for-age data available only for age 69 to 20 years. Normalized stature-for-age data available only for age 69 to 20 years.  MEDICATIONS: See list   DIETARY INTAKE:  Usual eating pattern includes 2 meals and 2 snacks per day.  Avoided foods include cucumbers, bananas.    24-hr recall:  B ( AM):salad with spam and cheese and bacon bits and macaroni OR sandwich with bologna or spam and tomato soup with coffee with cream and sugar (not everyday) and water  Snk ( AM): none  L ( 5 PM): chicken pie and mixed greens with peach cobbler Or cabbage with fat back and cornbread with soda Snk ( PM): none Snk (1 AM-5:30 AM):grapes AND  crackers and peanut butter Or chips Beverages: water and gatorade and one soda per day  Usual physical activity: none  Estimated energy needs: 1600 calories 180 g carbohydrates 120 g protein 44 g fat  Progress Towards Goal(s):  In progress.   Nutritional Diagnosis:  NB-1.1 Food and nutrition-related knowledge deficit As related to high salt, high fat, and energy dense food choices.  As evidenced by BMI of 36.07 and impaired fasting glucose.    Intervention:  Nutrition counseling provided. Provided instruction on how to work the new meter,  encouraged Shaeley to work on adding exercise, eating more frequently, and get back to testing her blood sugar regularly.   Plan: Check your blood sugar at different throughout the week and write down what you ate or if anything else that may have affected blood sugar.  Eat every 3-5 hours that you are awake if you are hungry Have snacks with protein and carbohydrates.  Switch drinks like soda and lemonade to sugar-free or water. Try to get at least 10 minutes of exercise in each day. (try the track after school) Continue fill half of your plates with vegetables, limit starches to a quarter of the plate. Choose meats that are less processed, lower in sodium, and lower in fat.  Monitoring/Evaluation:  Dietary intake, exercise, and body weight in 2 month(s).

## 2013-01-26 ENCOUNTER — Encounter: Payer: BC Managed Care – PPO | Attending: Internal Medicine | Admitting: Dietician

## 2013-01-26 VITALS — Ht 62.5 in | Wt 195.3 lb

## 2013-01-26 DIAGNOSIS — Z713 Dietary counseling and surveillance: Secondary | ICD-10-CM | POA: Insufficient documentation

## 2013-01-26 DIAGNOSIS — E119 Type 2 diabetes mellitus without complications: Secondary | ICD-10-CM | POA: Insufficient documentation

## 2013-01-26 DIAGNOSIS — E669 Obesity, unspecified: Secondary | ICD-10-CM

## 2013-01-26 NOTE — Patient Instructions (Addendum)
Continue to check your blood sugar at different throughout the week and write down what you ate or if anything else that may have affected blood sugar.  Eat every 3-5 hours that you are awake if you are hungry Have snacks with protein and carbohydrates.  Continue to switch drinks like soda and lemonade to sugar-free or water. Try to get at least 10 minutes of exercise in each day. (try the track after school or the [employer] water classes) Continue fill half of your plates with vegetables, limit starches to a quarter of the plate. Continue to cook and choose meats that are less processed, lower in sodium, and lower in fat.   

## 2013-01-26 NOTE — Progress Notes (Signed)
  Medical Nutrition Therapy:  Appt start time: 0830 end time:  0845.  Assessment:  Isabella Oliver is here today for a follow up for pre-diabetes. No weight since last visit. Blood sugar is averaging 130 mg/dl. Has not had another Hgb A1c recently. Having more pain in back, knee, elbow, and shoulder recently. Still eating mostly the same foods and not eating as much as before. Has been working 6-7 days per week and just finished working 12 days in a row.   Wt Readings from Last 3 Encounters:  01/26/13 195 lb 4.8 oz (88.587 kg)  11/24/12 195 lb 3.2 oz (88.542 kg)  09/22/12 197 lb 8 oz (89.585 kg)   Ht Readings from Last 3 Encounters:  01/26/13 5' 2.5" (1.588 m)  11/24/12 5' 2.5" (1.588 m)  09/22/12 5' 2.5" (1.588 m)   Body mass index is 35.13 kg/(m^2). @BMIFA @ Normalized weight-for-age data available only for age 36 to 20 years. Normalized stature-for-age data available only for age 36 to 20 years.   MEDICATIONS: See list   DIETARY INTAKE:  Usual eating pattern includes 2 meals and 2 snacks per day.  Avoided foods include cucumbers, bananas.    24-hr recall:  B ( AM):salad with spam and cheese and bacon bits and macaroni OR sandwich with bologna or spam and tomato soup with coffee with cream and sugar (not everyday) and water  Snk ( AM): none  L ( 5 PM): chicken pie and mixed greens with peach cobbler Or cabbage with fat back and cornbread with soda Snk ( PM): none Snk (1 AM-5:30 AM):grapes AND  crackers and peanut butter Or chips Beverages: water and gatorade and one soda per day  Usual physical activity: none  Estimated energy needs: 1600 calories 180 g carbohydrates 120 g protein 44 g fat  Progress Towards Goal(s):  In progress.   Nutritional Diagnosis:  NB-1.1 Food and nutrition-related knowledge deficit As related to high salt, high fat, and energy dense food choices.  As evidenced by BMI of 36.07 and impaired fasting glucose.    Intervention:  Nutrition counseling  provided. Provided instruction on how to work the new meter, encouraged Isabella Oliver to work on adding exercise, eating more frequently, and get back to testing her blood sugar regularly.   Plan: Continue to check your blood sugar at different throughout the week and write down what you ate or if anything else that may have affected blood sugar.  Eat every 3-5 hours that you are awake if you are hungry Have snacks with protein and carbohydrates.  Continue to switch drinks like soda and lemonade to sugar-free or water. Try to get at least 10 minutes of exercise in each day. (try the track after school or the [employer] classes) Continue fill half of your plates with vegetables, limit starches to a quarter of the plate. Continue to cook and choose meats that are less processed, lower in sodium, and lower in fat.  Monitoring/Evaluation:  Dietary intake, exercise, and body weight in 3 month(s).

## 2013-03-09 ENCOUNTER — Other Ambulatory Visit (HOSPITAL_COMMUNITY)
Admission: RE | Admit: 2013-03-09 | Discharge: 2013-03-09 | Disposition: A | Discharge status: Home / Self Care | Payer: BC Managed Care – PPO | Source: Ambulatory Visit | Attending: Internal Medicine | Admitting: Internal Medicine

## 2013-03-09 ENCOUNTER — Other Ambulatory Visit: Payer: Self-pay | Admitting: Internal Medicine

## 2013-03-09 DIAGNOSIS — Z01419 Encounter for gynecological examination (general) (routine) without abnormal findings: Secondary | ICD-10-CM | POA: Insufficient documentation

## 2013-04-27 ENCOUNTER — Encounter: Payer: BC Managed Care – PPO | Attending: Internal Medicine | Admitting: Dietician

## 2013-04-27 VITALS — Ht 62.5 in | Wt 191.9 lb

## 2013-04-27 DIAGNOSIS — R7309 Other abnormal glucose: Secondary | ICD-10-CM | POA: Insufficient documentation

## 2013-04-27 DIAGNOSIS — E669 Obesity, unspecified: Secondary | ICD-10-CM

## 2013-04-27 DIAGNOSIS — Z713 Dietary counseling and surveillance: Secondary | ICD-10-CM | POA: Insufficient documentation

## 2013-04-27 DIAGNOSIS — Z6834 Body mass index (BMI) 34.0-34.9, adult: Secondary | ICD-10-CM | POA: Insufficient documentation

## 2013-04-27 NOTE — Progress Notes (Signed)
  Medical Nutrition Therapy:  Appt start time: 0840 end time:  0855.  Assessment:  Isabella Oliver is here today for a follow up for pre-diabetes. Lost about 4+ lbs since last pain. States that her pain has improved greatly since her last visit. Doctor prescribed prednisone and Tramadol-acetaminophen to help with pain last Friday. Still working a lot of days in a row.   Blood sugar is averaging 108 mg/dl. Had a Hgb A1c 2 weeks ago but is not sure what the number. Will not get another one for 6 months.     Wt Readings from Last 3 Encounters:  04/27/13 191 lb 14.4 oz (87.045 kg)  01/26/13 195 lb 4.8 oz (88.587 kg)  11/24/12 195 lb 3.2 oz (88.542 kg)   Ht Readings from Last 3 Encounters:  04/27/13 5' 2.5" (1.588 m)  01/26/13 5' 2.5" (1.588 m)  11/24/12 5' 2.5" (1.588 m)   Body mass index is 34.52 kg/(m^2). @BMIFA @ Normalized weight-for-age data available only for age 1 to 20 years. Normalized stature-for-age data available only for age 1 to 20 years.   MEDICATIONS: See list   DIETARY INTAKE:  Usual eating pattern includes 2 meals and 2 snacks per day.  Avoided foods include cucumbers, bananas.    24-hr recall:  B ( AM):salad with spam and cheese and bacon bits and macaroni OR sandwich with bologna or spam and tomato soup with coffee with cream and sugar (not everyday) and water  Snk ( AM): none  L ( 5 PM): chicken pie and mixed greens with peach cobbler Or cabbage with fat back and cornbread with soda Snk ( PM): none Snk (1 AM-5:30 AM):grapes AND  crackers and peanut butter Or chips Beverages: water and gatorade and one soda per day  Usual physical activity: none  Estimated energy needs: 1600 calories 180 g carbohydrates 120 g protein 44 g fat  Progress Towards Goal(s):  In progress.   Nutritional Diagnosis:  NB-1.1 Food and nutrition-related knowledge deficit As related to high salt, high fat, and energy dense food choices.  As evidenced by BMI of 36.07 and impaired fasting  glucose.    Intervention:  Nutrition counseling provided. Encouraged Isabella Oliver to add exercise now that she is feeling better. Plan: Continue to check your blood sugar at different throughout the week and write down what you ate or if anything else that may have affected blood sugar.  Eat every 3-5 hours that you are awake if you are hungry Have snacks with protein and carbohydrates.  Continue to switch drinks like soda and lemonade to sugar-free or water. Try to get at least 10 minutes of exercise in each day. (try the track after school or the [employer] classes) Continue fill half of your plates with vegetables, limit starches to a quarter of the plate. Continue to cook and choose meats that are less processed, lower in sodium, and lower in fat.  Monitoring/Evaluation:  Dietary intake, exercise, and body weight in 6 month(s).

## 2013-04-27 NOTE — Patient Instructions (Signed)
Continue to check your blood sugar at different throughout the week and write down what you ate or if anything else that may have affected blood sugar.  Eat every 3-5 hours that you are awake if you are hungry Have snacks with protein and carbohydrates.  Continue to switch drinks like soda and lemonade to sugar-free or water. Try to get at least 10 minutes of exercise in each day. (try the track after school or the [employer] classes) Continue fill half of your plates with vegetables, limit starches to a quarter of the plate. Continue to cook and choose meats that are less processed, lower in sodium, and lower in fat.

## 2013-05-20 ENCOUNTER — Ambulatory Visit: Payer: BC Managed Care – PPO

## 2013-05-20 ENCOUNTER — Ambulatory Visit (INDEPENDENT_AMBULATORY_CARE_PROVIDER_SITE_OTHER): Payer: BC Managed Care – PPO | Admitting: Family Medicine

## 2013-05-20 VITALS — BP 168/80 | HR 81 | Temp 98.1°F | Resp 16 | Ht 63.0 in | Wt 178.2 lb

## 2013-05-20 DIAGNOSIS — M25551 Pain in right hip: Secondary | ICD-10-CM

## 2013-05-20 DIAGNOSIS — M25559 Pain in unspecified hip: Secondary | ICD-10-CM

## 2013-05-20 MED ORDER — HYDROCODONE-ACETAMINOPHEN 5-325 MG PO TABS: 1.0000 | ORAL_TABLET | Freq: Three times a day (TID) | ORAL | Status: DC

## 2013-05-20 MED ORDER — MELOXICAM 15 MG PO TABS: 15.0000 mg | ORAL_TABLET | Freq: Every day | ORAL | Status: DC

## 2013-05-20 NOTE — Progress Notes (Signed)
   Subjective:    Patient ID: Isabella BourgeoisZenobia F Hobday, female    DOB: 10-14-1950, 63 y.o.   MRN: [id number]  HPI Started getting right leg pain last night. Was walking at work and started getting sharp pain in right groin. Also goes down to knee. Couldn't sleep when she got home from work. Pain at rest and exacerbated by walking. Now using walker because so much trouble walking. Does not use any assistance at baseline. Has history of arthritis. No fever or chill. No fall or injury.  Review of Systems  All other systems reviewed and are negative.      Objective:   Physical Exam  Constitutional: She is oriented to person, place, and time. She appears well-developed and well-nourished. She appears distressed.  HENT:  Head: Normocephalic and atraumatic.  Eyes: Conjunctivae are normal. Pupils are equal, round, and reactive to light. No scleral icterus.  Cardiovascular: Normal rate.   Pulmonary/Chest: Effort normal.  Neurological: She is alert and oriented to person, place, and time.  Skin: Skin is warm and dry. She is not diaphoretic.  Psychiatric: She has a normal mood and affect. Her behavior is normal.  Right hip: She holds hip in 30 deg of flexion and will not fully extend. Severe pain with attempted internal and external rotation. Normal passive hip flexion.  [facility] reading (PRIMARY) by  Dr. Neomia DearVoss. Irregularity and step off of femoral neck. Concern for hip fracture.   Xray stat over-read by radiology as DJD only. No acute fracture.     Assessment & Plan:  #1. Acute right hip pain - Will treat with meloxicam, small number norco - Work excuse - F/u 24-48 hrs - Walker - Return precautions - Low threshold to re-xray at followup

## 2013-05-20 NOTE — Patient Instructions (Signed)
Thank you for coming in today  Try to work on gentle hip range of motion Take meloxicam daily Use hydrocodone sparingly as needed Followup in 24-48 hours for recheck Return sooner or go to ER if you have any fever, worsening pain.  Hip Pain The hips join the upper legs to the lower pelvis. The bones, cartilage, tendons, and muscles of the hip joint perform a lot of work each day holding your body weight and allowing you to move around. Hip pain is a common symptom. It can range from a minor ache to severe pain on 1 or both hips. Pain may be felt on the inside of the hip joint near the groin, or the outside near the buttocks and upper thigh. There may be swelling or stiffness as well. It occurs more often when a person walks or performs activity. There are many reasons hip pain can develop. CAUSES  It is important to work with your caregiver to identify the cause since many conditions can impact the bones, cartilage, muscles, and tendons of the hips. Causes for hip pain include:  Broken (fractured) bones.  Separation of the thighbone from the hip socket (dislocation).  Torn cartilage of the hip joint.  Swelling (inflammation) of a tendon (tendonitis), the sac within the hip joint (bursitis), or a joint.  A weakening in the abdominal wall (hernia), affecting the nerves to the hip.  Arthritis in the hip joint or lining of the hip joint.  Pinched nerves in the back, hip, or upper thigh.  A bulging disc in the spine (herniated disc).  Rarely, bone infection or cancer. DIAGNOSIS  The location of your hip pain will help your caregiver understand what may be causing the pain. A diagnosis is based on your medical history, your symptoms, results from your physical exam, and results from diagnostic tests. Diagnostic tests may include X-ray exams, a computerized magnetic scan (magnetic resonance imaging, MRI), or bone scan. TREATMENT  Treatment will depend on the cause of your hip pain.  Treatment may include:  Limiting activities and resting until symptoms improve.  Crutches or other walking supports (a cane or brace).  Ice, elevation, and compression.  Physical therapy or home exercises.  Shoe inserts or special shoes.  Losing weight.  Medications to reduce pain.  Undergoing surgery. HOME CARE INSTRUCTIONS   Only take over-the-counter or prescription medicines for pain, discomfort, or fever as directed by your caregiver.  Put ice on the injured area:  Put ice in a plastic bag.  Place a towel between your skin and the bag.  Leave the ice on for 15-20 minutes at a time, 03-04 times a day.  Keep your leg raised (elevated) when possible to lessen swelling.  Avoid activities that cause pain.  Follow specific exercises as directed by your caregiver.  Sleep with a pillow between your legs on your most comfortable side.  Record how often you have hip pain, the location of the pain, and what it feels like. This information may be helpful to you and your caregiver.  Ask your caregiver about returning to work or sports and whether you should drive.  Follow up with your caregiver for further exams, therapy, or testing as directed. SEEK MEDICAL CARE IF:   Your pain or swelling continues or worsens after 1 week.  You are feeling unwell or have chills.  You have increasing difficulty with walking.  You have a loss of sensation or other new symptoms.  You have questions or concerns. SEEK IMMEDIATE  MEDICAL CARE IF:   You cannot put weight on the affected hip.  You have fallen.  You have a sudden increase in pain and swelling in your hip.  You have a fever. MAKE SURE YOU:   Understand these instructions.  Will watch your condition.  Will get help right away if you are not doing well or get worse. Document Released: 06/14/2009 Document Revised: 03/19/2011 Document Reviewed: 06/14/2009 [facility] Patient Information 2014 [employer].

## 2013-05-22 ENCOUNTER — Encounter: Payer: Self-pay | Admitting: Family Medicine

## 2013-05-22 ENCOUNTER — Ambulatory Visit (INDEPENDENT_AMBULATORY_CARE_PROVIDER_SITE_OTHER): Payer: BC Managed Care – PPO | Admitting: Family Medicine

## 2013-05-22 VITALS — BP 132/65 | HR 60 | Temp 97.8°F | Resp 16 | Ht 61.75 in | Wt 186.2 lb

## 2013-05-22 DIAGNOSIS — M25551 Pain in right hip: Secondary | ICD-10-CM

## 2013-05-22 DIAGNOSIS — M25559 Pain in unspecified hip: Secondary | ICD-10-CM

## 2013-05-22 NOTE — Progress Notes (Signed)
Xray read and patient discussed with Dr. Neomia DearVoss. Agree with assessment and plan of care per her note. XR report below reviewed in office. Min WB with walker and close follow up in next 48hrs, and if still pain with weightbearing  Consider further imaging/CT.

## 2013-05-22 NOTE — Progress Notes (Signed)
[facility] [address], [place] [address] [phone]  Date:  05/22/2013   Name:  Isabella BourgeoisZenobia F Turney   DOB:  [birth year]   MRN:  [id number]  PCP:  Lillia MountainGRIFFIN,JOHN JOSEPH, MD    Chief Complaint: Knee Injury   History of Present Illness:  Isabella Oliver is a 63 y.o. very pleasant female patient who presents with the following:  Here today to recheck a knee problem- she was here two days ago with pain from the right hip into her knee.  X-rays read as negative, but there was some concern about the appearance of her plain films so she was brought back for a recheck.  .   She is using her medicaiton as needed.  She is walking "a lot better."  She does have some known arthritis at baseline.  She had been using her walker.    There are no active problems to display for this patient.   Past Medical History  Diagnosis Date  . Allergy   . Arthritis   . Asthma   . Hypertension     Past Surgical History  Procedure Laterality Date  . Tubal ligation    . Hand surgery    . Tubal ligation      History  Substance Use Topics  . Smoking status: Never Smoker   . Smokeless tobacco: Not on file  . Alcohol Use: No    Family History  Problem Relation Age of Onset  . Lung disease Mother   . Hypertension Father   . Cancer Sister   . Diabetes Sister   . Diabetes Brother   . Heart disease Brother   . Cancer Maternal Grandmother   . Cancer Maternal Grandfather     Allergies  Allergen Reactions  . Codeine Nausea Only  . Methocarbamol Nausea Only  . Tramadol Nausea Only    Medication list has been reviewed and updated.  Current Outpatient Prescriptions on File Prior to Visit  Medication Sig Dispense Refill  . Albuterol Sulfate (VENTOLIN HFA IN) Inhale into the lungs.      Marland Kitchen. amLODipine (NORVASC) 10 MG tablet Take 10 mg by mouth daily.      Marland Kitchen. aspirin 81 MG tablet Take 81 mg by mouth daily.      Marland Kitchen. HYDROcodone-acetaminophen (NORCO) 5-325 MG per tablet Take  1 tablet by mouth 3 (three) times daily.  15 tablet  0  . ibuprofen (ADVIL,MOTRIN) 200 MG tablet Take 800 mg by mouth every 6 (six) hours as needed for pain.       . meloxicam (MOBIC) 15 MG tablet Take 1 tablet (15 mg total) by mouth daily.  30 tablet  1  . rosuvastatin (CRESTOR) 10 MG tablet Take 10 mg by mouth once a week.      Marland Kitchen. VALSARTAN PO Take 1 tablet by mouth daily.       No current facility-administered medications on file prior to visit.    Review of Systems:  As per HPI- otherwise negative.   Physical Examination: Filed Vitals:   05/22/13 1513  BP: 132/65  Pulse: 60  Temp: 97.8 F (36.6 C)  Resp: 16   Filed Vitals:   05/22/13 1513  Height: 5' 1.75" (1.568 m)  Weight: 186 lb 3.2 oz (84.46 kg)   Body mass index is 34.35 kg/(m^2). Ideal Body Weight: Weight in (lb) to have BMI = 25: 135.3  GEN: WDWN, NAD, Non-toxic, A & O x 3, obese, looks well HEENT:  Atraumatic, Normocephalic. Neck supple. No masses, No LAD. Ears and Nose: No external deformity. CV: RRR, No M/G/R. No JVD. No thrill. No extra heart sounds. PULM: CTA B, no wheezes, crackles, rhonchi. No retractions. No resp. distress. No accessory muscle use. ABD: S, NT, ND EXTR: No c/c/e NEURO Normal gait.  PSYCH: Normally interactive. Conversant. Not depressed or anxious appearing.  Calm demeanor.  Right LE- knee is normal, ankle and foot normal.  She has mild discomfort behind the knee with flexion at the hip.  Otherwise hip is non- tender to flexion, extension, abduction and "log roll" rotation.  She is able to hop on the right foot.     Assessment and Plan: Hip pain, right  Injury to right leg- currently doing better.  Recent x-ray read as negative for fracture.  Discussed a CT but at this point she does not feel this is necessary.  She will let me know if not continuing to get better.    Signed Abbe AmsterdamJessica Brandalynn Ofallon, MD

## 2013-05-22 NOTE — Patient Instructions (Signed)
Good to see you today.  At this point I do not see evidence of a hip fracture. However let me know if you do not continue to improve, and we can order a CT scan if needed.

## 2013-07-12 ENCOUNTER — Other Ambulatory Visit: Payer: Self-pay | Admitting: Family Medicine

## 2013-07-23 ENCOUNTER — Other Ambulatory Visit: Payer: Self-pay | Admitting: Family Medicine

## 2013-07-29 ENCOUNTER — Other Ambulatory Visit: Payer: Self-pay

## 2013-07-29 DIAGNOSIS — Z1231 Encounter for screening mammogram for malignant neoplasm of breast: Secondary | ICD-10-CM

## 2013-08-24 ENCOUNTER — Ambulatory Visit
Admission: RE | Admit: 2013-08-24 | Discharge: 2013-08-24 | Disposition: A | Discharge status: Home / Self Care | Payer: BC Managed Care – PPO | Source: Ambulatory Visit

## 2013-08-24 DIAGNOSIS — Z1231 Encounter for screening mammogram for malignant neoplasm of breast: Secondary | ICD-10-CM

## 2013-10-26 ENCOUNTER — Ambulatory Visit: Payer: BC Managed Care – PPO | Admitting: Dietician

## 2013-10-30 ENCOUNTER — Ambulatory Visit (INDEPENDENT_AMBULATORY_CARE_PROVIDER_SITE_OTHER): Payer: BC Managed Care – PPO

## 2013-10-30 ENCOUNTER — Ambulatory Visit (INDEPENDENT_AMBULATORY_CARE_PROVIDER_SITE_OTHER): Payer: BC Managed Care – PPO | Admitting: Family Medicine

## 2013-10-30 ENCOUNTER — Telehealth: Payer: Self-pay | Admitting: Family Medicine

## 2013-10-30 VITALS — BP 144/68 | HR 76 | Temp 98.3°F | Resp 18 | Ht 62.0 in | Wt 192.0 lb

## 2013-10-30 DIAGNOSIS — M1990 Unspecified osteoarthritis, unspecified site: Secondary | ICD-10-CM | POA: Insufficient documentation

## 2013-10-30 DIAGNOSIS — I10 Essential (primary) hypertension: Secondary | ICD-10-CM | POA: Insufficient documentation

## 2013-10-30 DIAGNOSIS — R0602 Shortness of breath: Secondary | ICD-10-CM

## 2013-10-30 DIAGNOSIS — R079 Chest pain, unspecified: Secondary | ICD-10-CM

## 2013-10-30 DIAGNOSIS — M25562 Pain in left knee: Secondary | ICD-10-CM

## 2013-10-30 DIAGNOSIS — Z131 Encounter for screening for diabetes mellitus: Secondary | ICD-10-CM

## 2013-10-30 DIAGNOSIS — E785 Hyperlipidemia, unspecified: Secondary | ICD-10-CM | POA: Insufficient documentation

## 2013-10-30 DIAGNOSIS — E119 Type 2 diabetes mellitus without complications: Secondary | ICD-10-CM | POA: Insufficient documentation

## 2013-10-30 LAB — COMPREHENSIVE METABOLIC PANEL
ALT: 17 U/L (ref 0–35)
AST: 17 U/L (ref 0–37)
Alkaline Phosphatase: 65 U/L (ref 39–117)
BUN: 13 mg/dL (ref 6–23)
CO2: 27 mEq/L (ref 19–32)
Calcium: 10 mg/dL (ref 8.4–10.5)
Chloride: 103 mEq/L (ref 96–112)
Creat: 0.69 mg/dL (ref 0.50–1.10)
Glucose, Bld: 91 mg/dL (ref 70–99)
Sodium: 139 mEq/L (ref 135–145)
Total Bilirubin: 0.3 mg/dL (ref 0.2–1.2)
Total Protein: 6.7 g/dL (ref 6.0–8.3)

## 2013-10-30 LAB — POCT CBC
Granulocyte percent: 47.6 % (ref 37–80)
HCT, POC: 39.9 % (ref 37.7–47.9)
Hemoglobin: 12.4 g/dL (ref 12.2–16.2)
Lymph, poc: 2.8 (ref 0.6–3.4)
MCH, POC: 28.8 pg (ref 27–31.2)
MCHC: 31 g/dL — AB (ref 31.8–35.4)
MCV: 92.8 fL (ref 80–97)
MID (cbc): 0.5 (ref 0–0.9)
MPV: 7.4 fL (ref 0–99.8)
POC Granulocyte: 3 (ref 2–6.9)
POC LYMPH PERCENT: 43.8 % (ref 10–50)
POC MID %: 8.6 %M (ref 0–12)
Platelet Count, POC: 307 10*3/uL (ref 142–424)
RBC: 4.3 M/uL (ref 4.04–5.48)
RDW, POC: 15.6 %
WBC: 6.3 10*3/uL (ref 4.6–10.2)

## 2013-10-30 LAB — POCT GLYCOSYLATED HEMOGLOBIN (HGB A1C): Hemoglobin A1C: 6.4

## 2013-10-30 LAB — COMPREHENSIVE METABOLIC PANEL WITH GFR
Albumin: 4 g/dL (ref 3.5–5.2)
Potassium: 4.1 meq/L (ref 3.5–5.3)

## 2013-10-30 LAB — LIPID PANEL
Cholesterol: 222 mg/dL — ABNORMAL HIGH (ref 0–200)
HDL: 57 mg/dL (ref >39)
LDL Cholesterol: 144 mg/dL — ABNORMAL HIGH (ref 0–99)
Total CHOL/HDL Ratio: 3.9 Ratio
Triglycerides: 103 mg/dL (ref <150)
VLDL: 21 mg/dL (ref 0–40)

## 2013-10-30 LAB — TROPONIN I: Troponin I: 0.02 ng/mL (ref <0.06)

## 2013-10-30 LAB — TSH: TSH: 1.262 u[IU]/mL (ref 0.350–4.500)

## 2013-10-30 MED ORDER — HYDROCODONE-ACETAMINOPHEN 5-325 MG PO TABS: 1.0000 | ORAL_TABLET | Freq: Three times a day (TID) | ORAL | Status: DC

## 2013-10-30 NOTE — Telephone Encounter (Signed)
Lm that troponin was normal.

## 2013-10-30 NOTE — Patient Instructions (Signed)

## 2013-10-30 NOTE — Progress Notes (Signed)
Chief Complaint:  Chief Complaint  Patient presents with  . Leg Pain    left x3 days   . Chest Pain    this morning   . Shortness of Breath    a little this morning     HPI: Isabella Oliver is a 63 y.o. female who is here for:  Left sided chest pain this morning when she woke up and was getting out of bed. It did not radiate, it was localized and it is not currently  present.  She also has had some wheezing and SOB. She has asthma. She has not been sick, she has some wheezing and SOB due to her asthma and she has an inhaler which she uses sporadically.   She currently does not have any chest pain, the CP was in her upper left chest area, sharp pain, lasted for 5 seconds then stopped. She has never had something like this before.  She took her BP and it was 180/85 but she ahd not taken meds and was scared. 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  She denies  Any vision changes, palpitations, n/v/abd pain. Denies Diaphoresis or dizziness, She did have a slight headache but not associated with CP, it is resolved.  Risk factor incude HTN and Hyperlipidemia and DM which is diet controlled. She used to take crestor everyday and was having joint pain and has been taking it once a week and she states she can't tell is she has arthritis or joint pain from the statin.   She ate breakfast at  9 am : had 4 cups of regular coffee and low salt spam sandwiches, 1 1/2  spam sandwiches.   Left leg pain, she had had pain in both legs in the past. She has had 4 day history of left knee pain, she ahs had xrays in the past with 3 compartement arthritis, NKI. She ahas hs not been able to work. She walks all night long in textiles. She ahs had NKI, she has been taking mobic but it does not work. She felt a pop but not hear it. She has been using a cane due to the pain . She ahs pain  walking up and down stairs and being on her feet for long periods on cement, bt she does not have SOB or DOE with walking /exertion  or pedal edema.   She has never seen a cardiologist.   Past Medical History  Diagnosis Date  . Allergy   . Arthritis   . Asthma   . Hypertension    Past Surgical History  Procedure Laterality Date  . Tubal ligation    . Hand surgery    . Tubal ligation     History   Social History  . Marital Status: Divorced    Spouse Name: N/A    Number of Children: N/A  . Years of Education: N/A   Social History Main Topics  . Smoking status: Never Smoker   . Smokeless tobacco: None  . Alcohol Use: No  . Drug Use: No  . Sexual Activity: None   Other Topics Concern  . None   Social History Narrative  . None   Family History  Problem Relation Age of Onset  . Lung disease Mother   . Hypertension Father   . Cancer Sister   .  Diabetes Sister   . Diabetes Brother   . Heart disease Brother   . Cancer Maternal Grandmother   . Cancer Maternal Grandfather    Allergies  Allergen Reactions  . Codeine Nausea Only  . Methocarbamol Nausea Only  . Tramadol Nausea Only   Prior to Admission medications   Medication Sig Start Date End Date Taking? Authorizing Provider  acetaminophen (TYLENOL) 500 MG tablet Take 500 mg by mouth every 6 (six) hours as needed.   Yes Historical Provider, MD  Albuterol Sulfate (VENTOLIN HFA IN) Inhale into the lungs.   Yes Historical Provider, MD  amLODipine (NORVASC) 10 MG tablet Take 10 mg by mouth daily.   Yes Historical Provider, MD  aspirin 81 MG tablet Take 81 mg by mouth daily.   Yes Historical Provider, MD  rosuvastatin (CRESTOR) 10 MG tablet Take 10 mg by mouth once a week.   Yes Historical Provider, MD  VALSARTAN PO Take 1 tablet by mouth daily.   Yes Historical Provider, MD  HYDROcodone-acetaminophen (NORCO) 5-325 MG per tablet Take 1 tablet by mouth 3 (three) times daily. 05/20/13   Daine Gip, MD  ibuprofen  (ADVIL,MOTRIN) 200 MG tablet Take 800 mg by mouth every 6 (six) hours as needed for pain.     Historical Provider, MD  meloxicam (MOBIC) 15 MG tablet Take 1 tablet (15 mg total) by mouth daily. 05/20/13   Daine Gip, MD     ROS: The patient denies fevers, chills, night sweats, unintentional weight loss, palpitations,  dyspnea on exertion, nausea, vomiting, abdominal pain, dysuria, hematuria, melena, numbness, weakness, or tingling.   All other systems have been reviewed and were otherwise negative with the exception of those mentioned in the HPI and as above.    PHYSICAL EXAM: Filed Vitals:   10/30/13 1525  BP: 144/68  Pulse: 76  Temp: 98.3 F (36.8 C)  Resp: 18   Filed Vitals:   10/30/13 1525  Height: 5\' 2"  (1.575 m)  Weight: 192 lb (87.091 kg)   Body mass index is 35.11 kg/(m^2).  General: Alert, no acute distress HEENT:  Normocephalic, atraumatic, oropharynx patent. EOMI, PERRLA Cardiovascular:  Regular rate and rhythm, no rubs murmurs or gallops.  No Carotid bruits, radial pulse intact. No pedal edema.  Respiratory: Clear to auscultation bilaterally.  No wheezes, rales, or rhonchi.  No cyanosis, no use of accessory musculature GI: No organomegaly, abdomen is soft and non-tender, positive bowel sounds.  No masses. Skin: No rashes. Neurologic: Facial musculature symmetric. Psychiatric: Patient is appropriate throughout our interaction. Lymphatic: No cervical lymphadenopathy Musculoskeletal: Gait antalgic , 5/5 strenghh, 2/2 DTRs Left knee-no defmoties, no crepitus, + diffuse tenderness, + posterior knee pain, neg Homans.   LABS: Results for orders placed in visit on 10/30/13  TROPONIN I      Result Value Ref Range   Troponin I 0.02  <0.06 ng/mL  POCT CBC      Result Value Ref Range   WBC 6.3  4.6 - 10.2 K/uL   Lymph, poc 2.8  0.6 - 3.4   POC LYMPH PERCENT 43.8  10 - 50 %L   MID (cbc) 0.5  0 - 0.9   POC MID % 8.6  0 - 12 %M   POC Granulocyte 3.0  2 - 6.9    Granulocyte percent 47.6  37 - 80 %G   RBC 4.30  4.04 - 5.48 M/uL   Hemoglobin 12.4  12.2 - 16.2 g/dL   HCT, POC 57.8  46.9 -  47.9 %   MCV 92.8  80 - 97 fL   MCH, POC 28.8  27 - 31.2 pg   MCHC 31.0 (*) 31.8 - 35.4 g/dL   RDW, POC 16.115.6     Platelet Count, POC 307  142 - 424 K/uL   MPV 7.4  0 - 99.8 fL  POCT GLYCOSYLATED HEMOGLOBIN (HGB A1C)      Result Value Ref Range   Hemoglobin A1C 6.4       EKG/XRAY:   [facility] Chest-no acute cardiopulmonary process Left knee- worsened DJD.arthritis from 1 year ago. Neg for fx or dislocation   ASSESSMENT/PLAN: Encounter Diagnoses  Name Primary?  . Chest pain, unspecified chest pain type Yes  . Shortness of breath   . Knee pain, left   . Essential hypertension   . Hyperlipidemia   . Screening for diabetes mellitus   . Arthritis   . 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 She has been working 10 days atraight, seh was supposed to work today and tomorrow as well.  She declined to go currently after discussion about risk and benefits,  STAT Troponin and CMP pending EKG has RBBB similar to one in 2014. She will be referred to cardiology due to risk factors  And also RBBB Knee brace prn, rx norco prn  She will f/u with ortho, [facility].   She does not want steroid injections, she has pain relief for 4 days and then it returns after steroids F/u prn , again precautions given to go to ER prn   Gross sideeffects, risk and benefits, and alternatives of medications d/w patient. Patient is aware that all medications have potential sideeffects and we are unable to predict every sideeffect or drug-drug interaction that may occur.  LE, THAO PHUONG, DO 10/30/2013 7:17  PM

## 2013-11-16 ENCOUNTER — Ambulatory Visit: Payer: BC Managed Care – PPO | Admitting: Dietician

## 2013-11-19 ENCOUNTER — Encounter: Payer: Self-pay | Admitting: Family Medicine

## 2014-08-17 ENCOUNTER — Other Ambulatory Visit: Payer: Self-pay | Admitting: Internal Medicine

## 2014-08-17 ENCOUNTER — Ambulatory Visit
Admission: RE | Admit: 2014-08-17 | Discharge: 2014-08-17 | Disposition: A | Discharge status: Home / Self Care | Payer: Medicare Other | Source: Ambulatory Visit | Attending: Internal Medicine | Admitting: Internal Medicine

## 2014-08-17 DIAGNOSIS — M25551 Pain in right hip: Secondary | ICD-10-CM

## 2014-10-29 ENCOUNTER — Other Ambulatory Visit: Payer: Self-pay

## 2014-10-29 DIAGNOSIS — Z1231 Encounter for screening mammogram for malignant neoplasm of breast: Secondary | ICD-10-CM

## 2014-12-06 ENCOUNTER — Ambulatory Visit
Admission: RE | Admit: 2014-12-06 | Discharge: 2014-12-06 | Disposition: A | Discharge status: Home / Self Care | Payer: Medicare Other | Source: Ambulatory Visit

## 2014-12-06 DIAGNOSIS — Z1231 Encounter for screening mammogram for malignant neoplasm of breast: Secondary | ICD-10-CM

## 2015-10-16 ENCOUNTER — Ambulatory Visit (HOSPITAL_COMMUNITY)
Admission: EM | Admit: 2015-10-16 | Discharge: 2015-10-16 | Disposition: A | Discharge status: Home / Self Care | Payer: Medicare Other | Attending: Internal Medicine | Admitting: Internal Medicine

## 2015-10-16 ENCOUNTER — Encounter (HOSPITAL_COMMUNITY): Payer: Self-pay | Admitting: Emergency Medicine

## 2015-10-16 DIAGNOSIS — J302 Other seasonal allergic rhinitis: Secondary | ICD-10-CM

## 2015-10-16 DIAGNOSIS — H60392 Other infective otitis externa, left ear: Secondary | ICD-10-CM | POA: Diagnosis not present

## 2015-10-16 MED ORDER — FEXOFENADINE-PSEUDOEPHED ER 60-120 MG PO TB12: 1.0000 | ORAL_TABLET | Freq: Two times a day (BID) | ORAL | 0 refills | Status: DC

## 2015-10-16 MED ORDER — FLUTICASONE PROPIONATE 50 MCG/ACT NA SUSP: 1.0000 | Freq: Every day | NASAL | 2 refills | Status: DC

## 2015-10-16 MED ORDER — CIPROFLOXACIN-HYDROCORTISONE 0.2-1 % OT SUSP
3.0000 [drp] | Freq: Two times a day (BID) | OTIC | 0 refills | Status: AC
Start: 2015-10-16 — End: 2015-10-26

## 2015-10-16 NOTE — ED Triage Notes (Signed)
[facility] with a complaint of a headache and left ear pain x 1 week.

## 2015-10-16 NOTE — ED Provider Notes (Signed)
[facility]    CSN: [id number] Arrival date & time: 10/16/15  [id number]     History   Chief Complaint Chief Complaint  Patient presents with  . Otalgia  . Headache    HPI Isabella Oliver is a 65 y.o. female.   HPI Patient presents today with the above complaint.  States that about a week ago she began having bilat itchy, watery eyes with some sinus congestion, post nasal drainage and headache.  A couple of days later she began having itching in left ear along with pain.  Admits to using some qtips to scratch her ear.  No hearing loss.  Has had some dry cough.  Hx of seasonal allergies and asthma.  Denies chest pain, sob, fever, chills.   Past Medical History:  Diagnosis Date  . Allergy   . Arthritis   . Asthma   . Hypertension     Patient Active Problem List   Diagnosis Date Noted  . Arthritis 10/30/2013  . Type 2 diabetes mellitus without complication (HCC) 10/30/2013  . Essential hypertension 10/30/2013  . Hyperlipidemia 10/30/2013    Past Surgical History:  Procedure Laterality Date  . HAND SURGERY    . TUBAL LIGATION    . TUBAL LIGATION      OB History    No data available       Home Medications    Prior to Admission medications   Medication Sig Start Date End Date Taking? Authorizing Provider  acetaminophen (TYLENOL) 500 MG tablet Take 500 mg by mouth every 6 (six) hours as needed.   Yes Historical Provider, MD  Albuterol Sulfate (VENTOLIN HFA IN) Inhale into the lungs.   Yes Historical Provider, MD  amLODipine (NORVASC) 10 MG tablet Take 10 mg by mouth daily.   Yes Historical Provider, MD  aspirin 81 MG tablet Take 81 mg by mouth daily.   Yes Historical Provider, MD  ibuprofen (ADVIL,MOTRIN) 200 MG tablet Take 800 mg by mouth every 6 (six) hours as needed for pain.    Yes Historical Provider, MD  meloxicam (MOBIC) 15 MG tablet Take 1 tablet (15 mg total) by mouth daily. 05/20/13  Yes Daine GipErin L Voss, MD  rosuvastatin (CRESTOR) 10 MG tablet Take 10  mg by mouth once a week.   Yes Historical Provider, MD  VALSARTAN PO Take 1 tablet by mouth daily.   Yes Historical Provider, MD  ciprofloxacin-hydrocortisone (CIPRO HC) otic suspension Place 3 drops into the left ear 2 (two) times daily. 10/16/15 10/26/15  Naida SleightJames M Lemont Sitzmann, PA-C  fexofenadine-pseudoephedrine (ALLEGRA-D) 60-120 MG 12 hr tablet Take 1 tablet by mouth every 12 (twelve) hours. 10/16/15   Naida SleightJames M Marlos Carmen, PA-C  fluticasone (FLONASE) 50 MCG/ACT nasal spray Place 1 spray into both nostrils daily. 10/16/15   Naida SleightJames M Offie Waide, PA-C  HYDROcodone-acetaminophen (NORCO) 5-325 MG per tablet Take 1 tablet by mouth 3 (three) times daily. May cause constipation, use a stool softener. 10/30/13   Thao P Le, DO    Family History Family History  Problem Relation Age of Onset  . Lung disease Mother   . Hypertension Father   . Cancer Sister   . Diabetes Sister   . Diabetes Brother   . Heart disease Brother   . Cancer Maternal Grandmother   . Cancer Maternal Grandfather     Social History Social History  Substance Use Topics  . Smoking status: Never Smoker  . Smokeless tobacco: Never Used  . Alcohol use No  Allergies   Codeine; Methocarbamol; and Tramadol   Review of Systems Review of Systems  Constitutional: Negative for activity change, appetite change, chills, fatigue and fever.  HENT: Positive for congestion, ear pain, facial swelling, postnasal drip and sinus pressure. Negative for ear discharge, sore throat, trouble swallowing and voice change.   Eyes: Positive for redness and itching. Negative for photophobia and visual disturbance.  Respiratory: Negative.   Cardiovascular: Negative.   Gastrointestinal: Negative.   Genitourinary: Negative.   Musculoskeletal: Negative.   Neurological: Negative.   Hematological: Negative.   Psychiatric/Behavioral: Negative.      Physical Exam Triage Vital Signs ED Triage Vitals  Enc Vitals Group     BP 10/16/15 1938 186/67     Pulse Rate  10/16/15 1938 80     Resp 10/16/15 1938 16     Temp 10/16/15 1938 98.6 F (37 C)     Temp Source 10/16/15 1938 Oral     SpO2 10/16/15 1938 100 %     Weight --      Height --      Head Circumference --      Peak Flow --      Pain Score 10/16/15 1957 7     Pain Loc --      Pain Edu? --      Excl. in GC? --    No data found.   Updated Vital Signs BP 186/67 (BP Location: Left Arm)   Pulse 80   Temp 98.6 F (37 C) (Oral)   Resp 16   SpO2 100%   Visual Acuity Right Eye Distance:   Left Eye Distance:   Bilateral Distance:    Right Eye Near:   Left Eye Near:    Bilateral Near:     Physical Exam  Constitutional: She is oriented to person, place, and time. She appears well-developed and well-nourished. No distress.  HENT:  Head: Normocephalic and atraumatic.  She has slight scabbing and redness outer ear canal.  No drainage.  TM looks good.    Eyes: Pupils are equal, round, and reactive to light. Left eye exhibits no discharge.  Some puffiness around eyes.    Neck: Normal range of motion.  Cardiovascular: Normal rate.   Pulmonary/Chest: Effort normal. No respiratory distress. She has no wheezes.  Abdominal: She exhibits no distension.  Musculoskeletal: Normal range of motion.  Lymphadenopathy:    She has no cervical adenopathy.  Neurological: She is alert and oriented to person, place, and time.  Skin: Skin is warm and dry.  Psychiatric: She has a normal mood and affect.     UC Treatments / Results  Labs (all labs ordered are listed, but only abnormal results are displayed) Labs Reviewed - No data to display  EKG  EKG Interpretation None       Radiology No results found.  Procedures Procedures (including critical care time)  Medications Ordered in UC Medications - No data to display   Initial Impression / Assessment and Plan / UC Course  I have reviewed the triage vital signs and the nursing notes.  Pertinent labs & imaging results that were  available during my care of the patient were reviewed by me and considered in my medical decision making (see chart for details).  Clinical Course      Final Clinical Impressions(s) / UC Diagnoses   Final diagnoses:  Other infective acute otitis externa of left ear  Acute seasonal allergic rhinitis, unspecified trigger    New Prescriptions New  Prescriptions   CIPROFLOXACIN-HYDROCORTISONE (CIPRO HC) OTIC SUSPENSION    Place 3 drops into the left ear 2 (two) times daily.   FEXOFENADINE-PSEUDOEPHEDRINE (ALLEGRA-D) 60-120 MG 12 HR TABLET    Take 1 tablet by mouth every 12 (twelve) hours.   FLUTICASONE (FLONASE) 50 MCG/ACT NASAL SPRAY    Place 1 spray into both nostrils daily.  patient treated with above medications.  Will f/u with PCP in 3 days if not getting better or symptoms worsen.  All questions answered.  Can use OTC tylenol and/or ibuprofen for headache and ear pain.     Naida Sleight, PA-C 10/16/15 2107

## 2015-11-17 ENCOUNTER — Other Ambulatory Visit: Payer: Self-pay | Admitting: Internal Medicine

## 2015-11-17 DIAGNOSIS — Z1231 Encounter for screening mammogram for malignant neoplasm of breast: Secondary | ICD-10-CM

## 2015-12-19 ENCOUNTER — Ambulatory Visit
Admission: RE | Admit: 2015-12-19 | Discharge: 2015-12-19 | Disposition: A | Discharge status: Home / Self Care | Payer: Medicare Other | Source: Ambulatory Visit | Attending: Internal Medicine | Admitting: Internal Medicine

## 2015-12-19 DIAGNOSIS — Z1231 Encounter for screening mammogram for malignant neoplasm of breast: Secondary | ICD-10-CM

## 2016-02-10 ENCOUNTER — Other Ambulatory Visit: Payer: Self-pay | Admitting: Orthopedic Surgery

## 2016-02-15 ENCOUNTER — Other Ambulatory Visit: Payer: Self-pay

## 2016-02-15 ENCOUNTER — Encounter (HOSPITAL_COMMUNITY): Payer: Self-pay

## 2016-02-15 ENCOUNTER — Encounter (HOSPITAL_COMMUNITY)
Admission: RE | Admit: 2016-02-15 | Discharge: 2016-02-15 | Disposition: A | Discharge status: Home / Self Care | Payer: Medicare Other | Source: Ambulatory Visit | Attending: Orthopedic Surgery | Admitting: Orthopedic Surgery

## 2016-02-15 ENCOUNTER — Ambulatory Visit (HOSPITAL_COMMUNITY)
Admission: RE | Admit: 2016-02-15 | Discharge: 2016-02-15 | Disposition: A | Discharge status: Home / Self Care | Payer: Medicare Other | Source: Ambulatory Visit | Attending: Orthopedic Surgery | Admitting: Orthopedic Surgery

## 2016-02-15 DIAGNOSIS — Z01810 Encounter for preprocedural cardiovascular examination: Secondary | ICD-10-CM | POA: Diagnosis not present

## 2016-02-15 DIAGNOSIS — Z01818 Encounter for other preprocedural examination: Secondary | ICD-10-CM | POA: Insufficient documentation

## 2016-02-15 DIAGNOSIS — Z01812 Encounter for preprocedural laboratory examination: Secondary | ICD-10-CM | POA: Insufficient documentation

## 2016-02-15 HISTORY — DX: Cardiac murmur, unspecified: R01.1

## 2016-02-15 HISTORY — DX: Type 2 diabetes mellitus without complications: E11.9

## 2016-02-15 LAB — CBC WITH DIFFERENTIAL/PLATELET
BASOS PCT: 0 %
Basophils Absolute: 0 10*3/uL (ref 0.0–0.1)
EOS ABS: 0.3 10*3/uL (ref 0.0–0.7)
EOS PCT: 3 %
HCT: 33.2 % — ABNORMAL LOW (ref 36.0–46.0)
Hemoglobin: 11.2 g/dL — ABNORMAL LOW (ref 12.0–15.0)
LYMPHS ABS: 2.2 10*3/uL (ref 0.7–4.0)
Lymphocytes Relative: 27 %
MCH: 29.8 pg (ref 26.0–34.0)
MCHC: 33.7 g/dL (ref 30.0–36.0)
MCV: 88.3 fL (ref 78.0–100.0)
MONO ABS: 0.5 10*3/uL (ref 0.1–1.0)
Monocytes Relative: 6 %
NEUTROS PCT: 64 %
Neutro Abs: 5.4 10*3/uL (ref 1.7–7.7)
PLATELETS: 413 10*3/uL — AB (ref 150–400)
RBC: 3.76 MIL/uL — ABNORMAL LOW (ref 3.87–5.11)
RDW: 13.6 % (ref 11.5–15.5)
WBC: 8.4 10*3/uL (ref 4.0–10.5)

## 2016-02-15 LAB — COMPREHENSIVE METABOLIC PANEL
ALBUMIN: 3.6 g/dL (ref 3.5–5.0)
ALT: 14 U/L (ref 14–54)
ANION GAP: 12 (ref 5–15)
AST: 20 U/L (ref 15–41)
Alkaline Phosphatase: 66 U/L (ref 38–126)
BUN: 14 mg/dL (ref 6–20)
CO2: 26 mmol/L (ref 22–32)
Calcium: 10.3 mg/dL (ref 8.9–10.3)
Chloride: 100 mmol/L — ABNORMAL LOW (ref 101–111)
Creatinine, Ser: 0.91 mg/dL (ref 0.44–1.00)
GFR calc non Af Amer: >60 mL/min (ref >60)
GLUCOSE: 117 mg/dL — AB (ref 65–99)
POTASSIUM: 3.4 mmol/L — AB (ref 3.5–5.1)
SODIUM: 138 mmol/L (ref 135–145)
Total Bilirubin: 0.2 mg/dL — ABNORMAL LOW (ref 0.3–1.2)
Total Protein: 7.6 g/dL (ref 6.5–8.1)

## 2016-02-15 LAB — URINALYSIS, ROUTINE W REFLEX MICROSCOPIC
BILIRUBIN URINE: NEGATIVE
GLUCOSE, UA: NEGATIVE mg/dL
HGB URINE DIPSTICK: NEGATIVE
Ketones, ur: NEGATIVE mg/dL
Leukocytes, UA: NEGATIVE
Nitrite: NEGATIVE
PROTEIN: NEGATIVE mg/dL
SPECIFIC GRAVITY, URINE: 1.015 (ref 1.005–1.030)
pH: 5 (ref 5.0–8.0)

## 2016-02-15 LAB — PROTIME-INR
INR: 1.06
Prothrombin Time: 13.9 seconds (ref 11.4–15.2)

## 2016-02-15 LAB — GLUCOSE, CAPILLARY: Glucose-Capillary: 129 mg/dL — ABNORMAL HIGH (ref 65–99)

## 2016-02-15 LAB — SURGICAL PCR SCREEN
MRSA, PCR: NEGATIVE
Staphylococcus aureus: POSITIVE — AB

## 2016-02-15 LAB — APTT: aPTT: 28 seconds (ref 24–36)

## 2016-02-15 LAB — TYPE AND SCREEN
ABO/RH(D): O POS
ANTIBODY SCREEN: NEGATIVE

## 2016-02-15 LAB — ABO/RH: ABO/RH(D): O POS

## 2016-02-15 MED ORDER — CHLORHEXIDINE GLUCONATE 4 % EX LIQD: 60.0000 mL | Freq: Once | CUTANEOUS | Status: DC

## 2016-02-15 NOTE — Pre-Procedure Instructions (Addendum)
Isabella BourgeoisZenobia F Oliver  02/15/2016      [id number] [place] - [address] [facility] [address] [place] [address] Phone: [phone] Fax: [phone]    Your procedure is scheduled on Feb. 12, 2018 @1230  PM.  [facility] at 10:30 A.M.  Call this number if you have problems the morning of surgery:  [phone].   Remember:  Do not eat food or drink liquids after midnight.  Take these medicines the morning of surgery with A SIP OF WATER amlodipine (norvasc),  acetaminophen (tylenol), Albuterol (proventil HFA;Ventolin HFA) inhaler, fexofendadine (ALLEGRA-D), Tramadol (ultram), fluticasone (flonase) nasal spray.  Stop taking any Vitamins or Herbal Medications. No Goody's,BC's,Aleve,Advil,Motrin,Ibuprofen,or Fish Oil.   Do not wear jewelry, make-up or nail polish.  Do not wear lotions, powders, or perfumes, or deoderant.  Do not shave 48 hours prior to surgery.  Men may shave face and neck.  Do not bring valuables to the hospital.  [facility] is not responsible for any belongings or valuables.  Contacts, dentures or bridgework may not be worn into surgery.  Leave your suitcase in the car.  After surgery it may be brought to your room.  For patients admitted to the hospital, discharge time will be determined by your treatment team.  Patients discharged the day of surgery will not be allowed to drive home.   Special instructions:     How to Manage Your Diabetes Before and After Surgery  Why is it important to control my blood sugar before and after surgery? . Improving blood sugar levels before and after surgery helps healing and can limit problems. . A way of improving blood sugar control is eating a healthy diet by: o  Eating less sugar and carbohydrates o  Increasing activity/exercise o  Talking with your doctor about reaching your blood sugar goals . High blood sugars (greater  than 180 mg/dL) can raise your risk of infections and slow your recovery, so you will need to focus on controlling your diabetes during the weeks before surgery. . Make sure that the doctor who takes care of your diabetes knows about your planned surgery including the date and location.  How do I manage my blood sugar before surgery? . Check your blood sugar at least 4 times a day, starting 2 days before surgery, to make sure that the level is not too high or low. o Check your blood sugar the morning of your surgery when you wake up and every 2 hours until you get to the Short Stay unit. . If your blood sugar is less than 70 mg/dL, you will need to treat for low blood sugar: o Do not take insulin. o Treat a low blood sugar (less than 70 mg/dL) with  cup of clear juice (cranberry or apple), 4 glucose tablets, OR glucose gel. o Recheck blood sugar in 15 minutes after treatment (to make sure it is greater than 70 mg/dL). If your blood sugar is not greater than 70 mg/dL on recheck, call [phone] for further instructions. . Report your blood sugar to the short stay nurse when you get to Short Stay.  . If you are admitted to the hospital after surgery: o Your blood sugar will be checked by the staff and you will probably be given insulin after surgery (instead of oral diabetes medicines) to make sure you have good blood sugar levels. o The goal for blood sugar control  after surgery is 80-180 mg/dL.      WHAT DO I DO ABOUT MY DIABETES MEDICATION?   Marland Kitchen Do not take oral diabetes medicines (pills) the morning of surgery.     Please read over the following fact sheets that you were given. Pain Booklet, MRSA Information and Surgical Site Infection Prevention

## 2016-02-15 NOTE — Progress Notes (Signed)
PCP: Kirby FunkJohn Griffin, MD  Cardiologist: PT denies  EKG: denies in past year  ECHO: PT denies   Stress test: PT denies  Cardiac Cath: Pt denies

## 2016-02-16 LAB — HEMOGLOBIN A1C
HEMOGLOBIN A1C: 6.9 % — AB (ref 4.8–5.6)
Mean Plasma Glucose: 151 mg/dL

## 2016-02-16 NOTE — Progress Notes (Addendum)
Anesthesia Chart Review:  Pt is a 66 year old female scheduled for L total knee arthroplasty on 02/20/2016 with Jodi GeraldsJohn Graves, MD.   - PCP is Kirby FunkJohn Griffin, MD.   PMH includes:  HTN, DM, heart murmur (grade I/VI by notes from [facility] 2015, Dr. Valentina LucksGriffin documented no murmur 12/2015), asthma. Formers smoker. BMI 34.5  Medications include: albuterol, amlodipine, ASA, azilsartan-chlorthalidone, zetia, metformin  Preoperative labs reviewed.  HbA1c 6.9, glucose 117  CXR 02/15/16: Chronic bronchitic changes slightly more conspicuous than on the previous study. No pneumonia, CHF, nor other acute cardiopulmonary abnormality.  EKG 02/15/16: NSR. LAD. RBBB. Minimal voltage criteria for LVH, may be normal variant  Nuclear stress test 11/23/13 [facility]): Myocardial perfusion imaging normal. Overall LV systolic function normal without regional wall motion abnormalities. EF 69%.  Pt saw Marcy SalvoBridgette Allison, NP with [facility] 12/2013 for chest pain f/u. Normal stress test results reviewed.   If no changes, I anticipate pt can proceed with surgery as scheduled.   Rica Mastngela Kabbe, FNP-BC [facility]/Anesthesiology Phone: [phone] 02/17/2016 4:25 PM

## 2016-02-17 ENCOUNTER — Encounter (HOSPITAL_COMMUNITY): Payer: Self-pay

## 2016-02-20 ENCOUNTER — Inpatient Hospital Stay (HOSPITAL_COMMUNITY): Payer: Medicare Other | Admitting: Emergency Medicine

## 2016-02-20 ENCOUNTER — Inpatient Hospital Stay (HOSPITAL_COMMUNITY): Payer: Medicare Other | Admitting: Anesthesiology

## 2016-02-20 ENCOUNTER — Inpatient Hospital Stay (HOSPITAL_COMMUNITY)
Admission: RE | Admit: 2016-02-20 | Discharge: 2016-02-22 | DRG: 470 | Disposition: A | Discharge status: Home Health | Payer: Medicare Other | Source: Ambulatory Visit | Attending: Orthopedic Surgery | Admitting: Orthopedic Surgery

## 2016-02-20 ENCOUNTER — Encounter (HOSPITAL_COMMUNITY): Payer: Self-pay | Admitting: *Deleted

## 2016-02-20 ENCOUNTER — Encounter (HOSPITAL_COMMUNITY)
Admission: RE | Disposition: A | Discharge status: Home Health | Payer: Self-pay | Source: Ambulatory Visit | Attending: Orthopedic Surgery

## 2016-02-20 DIAGNOSIS — Z833 Family history of diabetes mellitus: Secondary | ICD-10-CM

## 2016-02-20 DIAGNOSIS — M1712 Unilateral primary osteoarthritis, left knee: Principal | ICD-10-CM | POA: Diagnosis present

## 2016-02-20 DIAGNOSIS — Z7984 Long term (current) use of oral hypoglycemic drugs: Secondary | ICD-10-CM | POA: Diagnosis not present

## 2016-02-20 DIAGNOSIS — Z8249 Family history of ischemic heart disease and other diseases of the circulatory system: Secondary | ICD-10-CM | POA: Diagnosis not present

## 2016-02-20 DIAGNOSIS — Z87891 Personal history of nicotine dependence: Secondary | ICD-10-CM

## 2016-02-20 DIAGNOSIS — E785 Hyperlipidemia, unspecified: Secondary | ICD-10-CM | POA: Diagnosis present

## 2016-02-20 DIAGNOSIS — M25562 Pain in left knee: Secondary | ICD-10-CM | POA: Diagnosis present

## 2016-02-20 DIAGNOSIS — Z91018 Allergy to other foods: Secondary | ICD-10-CM

## 2016-02-20 DIAGNOSIS — Z79899 Other long term (current) drug therapy: Secondary | ICD-10-CM | POA: Diagnosis not present

## 2016-02-20 DIAGNOSIS — E119 Type 2 diabetes mellitus without complications: Secondary | ICD-10-CM | POA: Diagnosis present

## 2016-02-20 DIAGNOSIS — I10 Essential (primary) hypertension: Secondary | ICD-10-CM | POA: Diagnosis present

## 2016-02-20 DIAGNOSIS — Z888 Allergy status to other drugs, medicaments and biological substances status: Secondary | ICD-10-CM

## 2016-02-20 DIAGNOSIS — Z6834 Body mass index (BMI) 34.0-34.9, adult: Secondary | ICD-10-CM | POA: Diagnosis not present

## 2016-02-20 DIAGNOSIS — J45909 Unspecified asthma, uncomplicated: Secondary | ICD-10-CM | POA: Diagnosis present

## 2016-02-20 DIAGNOSIS — Z885 Allergy status to narcotic agent status: Secondary | ICD-10-CM | POA: Diagnosis not present

## 2016-02-20 DIAGNOSIS — Z7982 Long term (current) use of aspirin: Secondary | ICD-10-CM | POA: Diagnosis not present

## 2016-02-20 HISTORY — PX: TOTAL KNEE ARTHROPLASTY: SHX125

## 2016-02-20 LAB — GLUCOSE, CAPILLARY
GLUCOSE-CAPILLARY: 102 mg/dL — AB (ref 65–99)
Glucose-Capillary: 168 mg/dL — ABNORMAL HIGH (ref 65–99)
Glucose-Capillary: 128 mg/dL — ABNORMAL HIGH (ref 65–99)
Glucose-Capillary: 227 mg/dL — ABNORMAL HIGH (ref 65–99)

## 2016-02-20 SURGERY — ARTHROPLASTY, KNEE, TOTAL
Anesthesia: Monitor Anesthesia Care | Site: Knee | Laterality: Left

## 2016-02-20 MED ORDER — OXYCODONE-ACETAMINOPHEN 5-325 MG PO TABS: 1.0000 | ORAL_TABLET | ORAL | 0 refills | Status: DC | PRN

## 2016-02-20 MED ORDER — LACTATED RINGERS IV SOLN
INTRAVENOUS | Status: DC
  Administered 2016-02-20 (×3): via INTRAVENOUS

## 2016-02-20 MED ORDER — ASPIRIN EC 325 MG PO TBEC
325.0000 mg | DELAYED_RELEASE_TABLET | Freq: Two times a day (BID) | ORAL | Status: DC
  Administered 2016-02-20 – 2016-02-22 (×4): 325 mg via ORAL
  Filled 2016-02-20 (×4): qty 1

## 2016-02-20 MED ORDER — BUPIVACAINE HCL (PF) 0.5 % IJ SOLN
INTRAMUSCULAR | Status: DC | PRN
  Administered 2016-02-20: 30 mL

## 2016-02-20 MED ORDER — GABAPENTIN 300 MG PO CAPS
300.0000 mg | ORAL_CAPSULE | Freq: Two times a day (BID) | ORAL | Status: DC
  Administered 2016-02-20 – 2016-02-22 (×4): 300 mg via ORAL
  Filled 2016-02-20 (×4): qty 1

## 2016-02-20 MED ORDER — CYCLOBENZAPRINE HCL 10 MG PO TABS
10.0000 mg | ORAL_TABLET | Freq: Three times a day (TID) | ORAL | Status: DC | PRN
  Administered 2016-02-20 – 2016-02-22 (×3): 10 mg via ORAL
  Filled 2016-02-20 (×3): qty 1

## 2016-02-20 MED ORDER — CHLORTHALIDONE 25 MG PO TABS
25.0000 mg | ORAL_TABLET | Freq: Every day | ORAL | Status: DC
  Administered 2016-02-20 – 2016-02-22 (×3): 25 mg via ORAL
  Filled 2016-02-20 (×4): qty 1

## 2016-02-20 MED ORDER — ROPIVACAINE HCL 7.5 MG/ML IJ SOLN
INTRAMUSCULAR | Status: DC | PRN
  Administered 2016-02-20: 20 mL via PERINEURAL

## 2016-02-20 MED ORDER — HYDROMORPHONE HCL 1 MG/ML IJ SOLN: 0.5000 mg | INTRAMUSCULAR | Status: DC | PRN

## 2016-02-20 MED ORDER — HYDROMORPHONE HCL 1 MG/ML IJ SOLN
INTRAMUSCULAR | Status: AC
  Filled 2016-02-20: qty 0.5

## 2016-02-20 MED ORDER — BISACODYL 5 MG PO TBEC: 5.0000 mg | DELAYED_RELEASE_TABLET | Freq: Every day | ORAL | Status: DC | PRN

## 2016-02-20 MED ORDER — HYDROMORPHONE HCL 1 MG/ML IJ SOLN
0.2500 mg | INTRAMUSCULAR | Status: DC | PRN
  Administered 2016-02-20 (×3): 0.5 mg via INTRAVENOUS

## 2016-02-20 MED ORDER — AZILSARTAN-CHLORTHALIDONE 40-25 MG PO TABS: 1.0000 | ORAL_TABLET | Freq: Every day | ORAL | Status: DC

## 2016-02-20 MED ORDER — PROPOFOL 10 MG/ML IV BOLUS
INTRAVENOUS | Status: DC | PRN
  Administered 2016-02-20: 20 mg via INTRAVENOUS

## 2016-02-20 MED ORDER — SODIUM CHLORIDE 0.9 % IV SOLN
1000.0000 mg | INTRAVENOUS | Status: AC
  Administered 2016-02-20: 1000 mg via INTRAVENOUS
  Filled 2016-02-20: qty 10

## 2016-02-20 MED ORDER — SODIUM CHLORIDE 0.9 % IR SOLN
Status: DC | PRN
  Administered 2016-02-20: 3000 mL

## 2016-02-20 MED ORDER — DOCUSATE SODIUM 100 MG PO CAPS
100.0000 mg | ORAL_CAPSULE | Freq: Two times a day (BID) | ORAL | Status: DC
  Administered 2016-02-20 – 2016-02-22 (×4): 100 mg via ORAL
  Filled 2016-02-20 (×4): qty 1

## 2016-02-20 MED ORDER — ONDANSETRON HCL 4 MG PO TABS: 4.0000 mg | ORAL_TABLET | Freq: Four times a day (QID) | ORAL | Status: DC | PRN

## 2016-02-20 MED ORDER — PROMETHAZINE HCL 25 MG/ML IJ SOLN
INTRAMUSCULAR | Status: AC
  Filled 2016-02-20: qty 1

## 2016-02-20 MED ORDER — CEFAZOLIN SODIUM-DEXTROSE 2-4 GM/100ML-% IV SOLN
2.0000 g | Freq: Four times a day (QID) | INTRAVENOUS | Status: AC
  Administered 2016-02-20 – 2016-02-21 (×2): 2 g via INTRAVENOUS
  Filled 2016-02-20 (×2): qty 100

## 2016-02-20 MED ORDER — DIPHENHYDRAMINE HCL 12.5 MG/5ML PO ELIX: 12.5000 mg | ORAL_SOLUTION | ORAL | Status: DC | PRN

## 2016-02-20 MED ORDER — BUPIVACAINE LIPOSOME 1.3 % IJ SUSP
20.0000 mL | Freq: Once | INTRAMUSCULAR | Status: DC
  Filled 2016-02-20: qty 20

## 2016-02-20 MED ORDER — AMLODIPINE BESYLATE 10 MG PO TABS
10.0000 mg | ORAL_TABLET | Freq: Every day | ORAL | Status: DC
  Administered 2016-02-21 – 2016-02-22 (×2): 10 mg via ORAL
  Filled 2016-02-20 (×2): qty 1

## 2016-02-20 MED ORDER — SODIUM CHLORIDE 0.9 % IV SOLN
INTRAVENOUS | Status: DC
  Administered 2016-02-20 – 2016-02-21 (×2): via INTRAVENOUS

## 2016-02-20 MED ORDER — PHENYLEPHRINE HCL 10 MG/ML IJ SOLN
INTRAMUSCULAR | Status: DC | PRN
  Administered 2016-02-20: 80 ug via INTRAVENOUS
  Administered 2016-02-20: 160 ug via INTRAVENOUS
  Administered 2016-02-20 (×3): 80 ug via INTRAVENOUS

## 2016-02-20 MED ORDER — FENTANYL CITRATE (PF) 100 MCG/2ML IJ SOLN
INTRAMUSCULAR | Status: AC
  Administered 2016-02-20: 100 ug
  Filled 2016-02-20: qty 2

## 2016-02-20 MED ORDER — OXYCODONE HCL 5 MG PO TABS
5.0000 mg | ORAL_TABLET | ORAL | Status: DC | PRN
  Administered 2016-02-20 (×2): 10 mg via ORAL
  Administered 2016-02-20: 5 mg via ORAL
  Administered 2016-02-21 – 2016-02-22 (×6): 10 mg via ORAL
  Filled 2016-02-20 (×8): qty 2

## 2016-02-20 MED ORDER — LIDOCAINE 2% (20 MG/ML) 5 ML SYRINGE
INTRAMUSCULAR | Status: AC
  Filled 2016-02-20: qty 5

## 2016-02-20 MED ORDER — TIZANIDINE HCL 2 MG PO TABS: 2.0000 mg | ORAL_TABLET | Freq: Three times a day (TID) | ORAL | 0 refills | Status: DC | PRN

## 2016-02-20 MED ORDER — 0.9 % SODIUM CHLORIDE (POUR BTL) OPTIME
TOPICAL | Status: DC | PRN
  Administered 2016-02-20: 1000 mL

## 2016-02-20 MED ORDER — MIDAZOLAM HCL 2 MG/2ML IJ SOLN
INTRAMUSCULAR | Status: AC
  Filled 2016-02-20: qty 2

## 2016-02-20 MED ORDER — CEFAZOLIN SODIUM-DEXTROSE 2-4 GM/100ML-% IV SOLN
INTRAVENOUS | Status: AC
  Filled 2016-02-20: qty 100

## 2016-02-20 MED ORDER — BUPIVACAINE HCL (PF) 0.5 % IJ SOLN
INTRAMUSCULAR | Status: AC
  Filled 2016-02-20: qty 30

## 2016-02-20 MED ORDER — ACETAMINOPHEN 650 MG RE SUPP: 650.0000 mg | Freq: Four times a day (QID) | RECTAL | Status: DC | PRN

## 2016-02-20 MED ORDER — OXYCODONE HCL 5 MG PO TABS
ORAL_TABLET | ORAL | Status: AC
  Filled 2016-02-20: qty 1

## 2016-02-20 MED ORDER — METFORMIN HCL 500 MG PO TABS
250.0000 mg | ORAL_TABLET | Freq: Two times a day (BID) | ORAL | Status: DC
  Administered 2016-02-21 – 2016-02-22 (×3): 250 mg via ORAL
  Filled 2016-02-20 (×3): qty 1

## 2016-02-20 MED ORDER — BUPIVACAINE LIPOSOME 1.3 % IJ SUSP
INTRAMUSCULAR | Status: DC | PRN
  Administered 2016-02-20: 20 mL

## 2016-02-20 MED ORDER — ALBUTEROL SULFATE (2.5 MG/3ML) 0.083% IN NEBU: 3.0000 mL | INHALATION_SOLUTION | Freq: Four times a day (QID) | RESPIRATORY_TRACT | Status: DC | PRN

## 2016-02-20 MED ORDER — CELECOXIB 200 MG PO CAPS
200.0000 mg | ORAL_CAPSULE | Freq: Two times a day (BID) | ORAL | Status: DC
  Administered 2016-02-20 – 2016-02-22 (×3): 200 mg via ORAL
  Filled 2016-02-20 (×4): qty 1

## 2016-02-20 MED ORDER — EZETIMIBE 10 MG PO TABS
10.0000 mg | ORAL_TABLET | Freq: Every day | ORAL | Status: DC
  Administered 2016-02-20 – 2016-02-22 (×3): 10 mg via ORAL
  Filled 2016-02-20 (×3): qty 1

## 2016-02-20 MED ORDER — ALUM & MAG HYDROXIDE-SIMETH 200-200-20 MG/5ML PO SUSP: 30.0000 mL | ORAL | Status: DC | PRN

## 2016-02-20 MED ORDER — IRBESARTAN 300 MG PO TABS
300.0000 mg | ORAL_TABLET | Freq: Every day | ORAL | Status: DC
  Administered 2016-02-20 – 2016-02-22 (×3): 300 mg via ORAL
  Filled 2016-02-20 (×3): qty 1

## 2016-02-20 MED ORDER — ONDANSETRON HCL 4 MG/2ML IJ SOLN
INTRAMUSCULAR | Status: DC | PRN
  Administered 2016-02-20: 4 mg via INTRAVENOUS

## 2016-02-20 MED ORDER — TRANEXAMIC ACID 1000 MG/10ML IV SOLN
2000.0000 mg | Freq: Once | INTRAVENOUS | Status: DC
  Filled 2016-02-20: qty 20

## 2016-02-20 MED ORDER — ASPIRIN EC 325 MG PO TBEC: 325.0000 mg | DELAYED_RELEASE_TABLET | Freq: Two times a day (BID) | ORAL | 0 refills | Status: DC

## 2016-02-20 MED ORDER — MIDAZOLAM HCL 5 MG/5ML IJ SOLN
INTRAMUSCULAR | Status: DC | PRN
  Administered 2016-02-20: 1 mg via INTRAVENOUS

## 2016-02-20 MED ORDER — MAGNESIUM CITRATE PO SOLN: 1.0000 | Freq: Once | ORAL | Status: DC | PRN

## 2016-02-20 MED ORDER — FENTANYL CITRATE (PF) 100 MCG/2ML IJ SOLN
INTRAMUSCULAR | Status: AC
  Filled 2016-02-20: qty 2

## 2016-02-20 MED ORDER — INSULIN ASPART 100 UNIT/ML ~~LOC~~ SOLN
0.0000 [IU] | Freq: Three times a day (TID) | SUBCUTANEOUS | Status: DC
  Administered 2016-02-21: 5 [IU] via SUBCUTANEOUS
  Administered 2016-02-21: 15 [IU] via SUBCUTANEOUS
  Administered 2016-02-22: 5 [IU] via SUBCUTANEOUS
  Administered 2016-02-22: 2 [IU] via SUBCUTANEOUS

## 2016-02-20 MED ORDER — HYDROMORPHONE HCL 2 MG/ML IJ SOLN
0.5000 mg | INTRAMUSCULAR | Status: DC | PRN
  Administered 2016-02-20 – 2016-02-21 (×2): 1 mg via INTRAVENOUS
  Filled 2016-02-20 (×2): qty 1

## 2016-02-20 MED ORDER — FENTANYL CITRATE (PF) 100 MCG/2ML IJ SOLN
INTRAMUSCULAR | Status: DC | PRN
  Administered 2016-02-20: 50 ug via INTRAVENOUS

## 2016-02-20 MED ORDER — POLYETHYLENE GLYCOL 3350 17 G PO PACK: 17.0000 g | PACK | Freq: Every day | ORAL | Status: DC | PRN

## 2016-02-20 MED ORDER — PHENYLEPHRINE HCL 10 MG/ML IJ SOLN
INTRAVENOUS | Status: DC | PRN
  Administered 2016-02-20: 25 ug/min via INTRAVENOUS

## 2016-02-20 MED ORDER — DEXAMETHASONE SODIUM PHOSPHATE 10 MG/ML IJ SOLN
10.0000 mg | Freq: Two times a day (BID) | INTRAMUSCULAR | Status: AC
  Administered 2016-02-20 – 2016-02-21 (×3): 10 mg via INTRAVENOUS
  Filled 2016-02-20 (×3): qty 1

## 2016-02-20 MED ORDER — DOCUSATE SODIUM 100 MG PO CAPS: 100.0000 mg | ORAL_CAPSULE | Freq: Two times a day (BID) | ORAL | 0 refills | Status: DC

## 2016-02-20 MED ORDER — TRANEXAMIC ACID 1000 MG/10ML IV SOLN
INTRAVENOUS | Status: DC | PRN
  Administered 2016-02-20: 2000 mg via TOPICAL

## 2016-02-20 MED ORDER — MIDAZOLAM HCL 2 MG/2ML IJ SOLN
INTRAMUSCULAR | Status: AC
  Administered 2016-02-20: 2 mg
  Filled 2016-02-20: qty 2

## 2016-02-20 MED ORDER — CEFAZOLIN SODIUM-DEXTROSE 2-4 GM/100ML-% IV SOLN
2.0000 g | INTRAVENOUS | Status: AC
  Administered 2016-02-20: 2 g via INTRAVENOUS

## 2016-02-20 MED ORDER — ZOLPIDEM TARTRATE 5 MG PO TABS: 5.0000 mg | ORAL_TABLET | Freq: Every evening | ORAL | Status: DC | PRN

## 2016-02-20 MED ORDER — ONDANSETRON HCL 4 MG/2ML IJ SOLN: 4.0000 mg | Freq: Four times a day (QID) | INTRAMUSCULAR | Status: DC | PRN

## 2016-02-20 MED ORDER — TRANEXAMIC ACID 1000 MG/10ML IV SOLN
1000.0000 mg | Freq: Once | INTRAVENOUS | Status: AC
  Administered 2016-02-20: 1000 mg via INTRAVENOUS
  Filled 2016-02-20: qty 10

## 2016-02-20 MED ORDER — BUPIVACAINE IN DEXTROSE 0.75-8.25 % IT SOLN
INTRATHECAL | Status: DC | PRN
  Administered 2016-02-20: 15 mg via INTRATHECAL

## 2016-02-20 MED ORDER — PROMETHAZINE HCL 25 MG/ML IJ SOLN
6.2500 mg | INTRAMUSCULAR | Status: DC | PRN
Start: 2016-02-20 — End: 2016-02-20
  Administered 2016-02-20: 12.5 mg via INTRAVENOUS

## 2016-02-20 MED ORDER — PROPOFOL 500 MG/50ML IV EMUL
INTRAVENOUS | Status: DC | PRN
  Administered 2016-02-20: 75 ug/kg/min via INTRAVENOUS

## 2016-02-20 MED ORDER — ACETAMINOPHEN 325 MG PO TABS: 650.0000 mg | ORAL_TABLET | Freq: Four times a day (QID) | ORAL | Status: DC | PRN

## 2016-02-20 MED ORDER — ONDANSETRON HCL 4 MG/2ML IJ SOLN
INTRAMUSCULAR | Status: AC
  Filled 2016-02-20: qty 2

## 2016-02-20 SURGICAL SUPPLY — 65 items
BANDAGE ELASTIC 6 VELCRO ST LF (GAUZE/BANDAGES/DRESSINGS) ×3 IMPLANT
BANDAGE ESMARK 6X9 LF (GAUZE/BANDAGES/DRESSINGS) ×1 IMPLANT
BENZOIN TINCTURE PRP APPL 2/3 (GAUZE/BANDAGES/DRESSINGS) ×3 IMPLANT
BLADE SAGITTAL 25.0X1.19X90 (BLADE) ×2 IMPLANT
BLADE SAGITTAL 25.0X1.19X90MM (BLADE) ×1
BLADE SAW SAG 90X13X1.27 (BLADE) ×3 IMPLANT
BNDG ESMARK 6X9 LF (GAUZE/BANDAGES/DRESSINGS) ×3
BOWL SMART MIX CTS (DISPOSABLE) ×3 IMPLANT
CEMENT HV SMART SET (Cement) ×6 IMPLANT
CLOSURE WOUND 1/2 X4 (GAUZE/BANDAGES/DRESSINGS) ×1
COVER SURGICAL LIGHT HANDLE (MISCELLANEOUS) ×6 IMPLANT
CUFF TOURNIQUET SINGLE 34IN LL (TOURNIQUET CUFF) ×3 IMPLANT
CUFF TOURNIQUET SINGLE 44IN (TOURNIQUET CUFF) IMPLANT
DRAPE EXTREMITY T 121X128X90 (DRAPE) ×3 IMPLANT
DRAPE U-SHAPE 47X51 STRL (DRAPES) ×3 IMPLANT
DRSG AQUACEL AG ADV 3.5X10 (GAUZE/BANDAGES/DRESSINGS) IMPLANT
DRSG PAD ABDOMINAL 8X10 ST (GAUZE/BANDAGES/DRESSINGS) ×6 IMPLANT
DURAPREP 26ML APPLICATOR (WOUND CARE) ×3 IMPLANT
ELECT REM PT RETURN 9FT ADLT (ELECTROSURGICAL) ×3
ELECTRODE REM PT RTRN 9FT ADLT (ELECTROSURGICAL) ×1 IMPLANT
EVACUATOR 1/8 PVC DRAIN (DRAIN) IMPLANT
FACESHIELD WRAPAROUND (MASK) ×3 IMPLANT
GAUZE SPONGE 4X4 12PLY STRL (GAUZE/BANDAGES/DRESSINGS) ×3 IMPLANT
GLOVE BIOGEL PI IND STRL 6.5 (GLOVE) ×1 IMPLANT
GLOVE BIOGEL PI IND STRL 7.5 (GLOVE) ×1 IMPLANT
GLOVE BIOGEL PI IND STRL 8 (GLOVE) ×2 IMPLANT
GLOVE BIOGEL PI INDICATOR 6.5 (GLOVE) ×2
GLOVE BIOGEL PI INDICATOR 7.5 (GLOVE) ×2
GLOVE BIOGEL PI INDICATOR 8 (GLOVE) ×4
GLOVE ECLIPSE 7.5 STRL STRAW (GLOVE) ×6 IMPLANT
GOWN STRL REUS W/ TWL LRG LVL3 (GOWN DISPOSABLE) ×1 IMPLANT
GOWN STRL REUS W/ TWL XL LVL3 (GOWN DISPOSABLE) ×2 IMPLANT
GOWN STRL REUS W/TWL LRG LVL3 (GOWN DISPOSABLE) ×2
GOWN STRL REUS W/TWL XL LVL3 (GOWN DISPOSABLE) ×4
HANDPIECE INTERPULSE COAX TIP (DISPOSABLE) ×2
HOOD PEEL AWAY FACE SHEILD DIS (HOOD) ×6 IMPLANT
IMMOBILIZER KNEE 20 (SOFTGOODS) ×3
IMMOBILIZER KNEE 20 THIGH 36 (SOFTGOODS) ×1 IMPLANT
IMMOBILIZER KNEE 22 UNIV (SOFTGOODS) ×3 IMPLANT
KIT BASIN OR (CUSTOM PROCEDURE TRAY) ×3 IMPLANT
KIT ROOM TURNOVER OR (KITS) ×3 IMPLANT
MANIFOLD NEPTUNE II (INSTRUMENTS) ×3 IMPLANT
NEEDLE 22X1 1/2 (OR ONLY) (NEEDLE) ×3 IMPLANT
NS IRRIG 1000ML POUR BTL (IV SOLUTION) ×3 IMPLANT
PACK TOTAL JOINT (CUSTOM PROCEDURE TRAY) ×3 IMPLANT
PAD ARMBOARD 7.5X6 YLW CONV (MISCELLANEOUS) ×6 IMPLANT
PADDING CAST COTTON 6X4 STRL (CAST SUPPLIES) ×3 IMPLANT
SET HNDPC FAN SPRY TIP SCT (DISPOSABLE) ×1 IMPLANT
SPONGE GAUZE 4X4 12PLY STER LF (GAUZE/BANDAGES/DRESSINGS) ×3 IMPLANT
STAPLER VISISTAT 35W (STAPLE) IMPLANT
STRIP CLOSURE SKIN 1/2X4 (GAUZE/BANDAGES/DRESSINGS) ×2 IMPLANT
SUCTION FRAZIER HANDLE 10FR (MISCELLANEOUS) ×2
SUCTION TUBE FRAZIER 10FR DISP (MISCELLANEOUS) ×1 IMPLANT
SUT MNCRL AB 3-0 PS2 18 (SUTURE) IMPLANT
SUT VIC AB 0 CTB1 27 (SUTURE) ×6 IMPLANT
SUT VIC AB 1 CT1 27 (SUTURE) ×4
SUT VIC AB 1 CT1 27XBRD ANBCTR (SUTURE) ×2 IMPLANT
SUT VIC AB 2-0 CTB1 (SUTURE) ×6 IMPLANT
SYR 50ML LL SCALE MARK (SYRINGE) ×3 IMPLANT
TOWEL OR 17X24 6PK STRL BLUE (TOWEL DISPOSABLE) ×3 IMPLANT
TOWEL OR 17X26 10 PK STRL BLUE (TOWEL DISPOSABLE) ×3 IMPLANT
TRAY CATH 16FR W/PLASTIC CATH (SET/KITS/TRAYS/PACK) IMPLANT
TRAY FOLEY CATH 16FRSI W/METER (SET/KITS/TRAYS/PACK) IMPLANT
TRAY FOLEY W/METER SILVER 14FR (SET/KITS/TRAYS/PACK) ×3 IMPLANT
WRAP KNEE MAXI GEL POST OP (GAUZE/BANDAGES/DRESSINGS) ×3 IMPLANT

## 2016-02-20 NOTE — H&P (Addendum)
TOTAL KNEE ADMISSION H&P  Patient is being admitted for left total knee arthroplasty.  Subjective:  Chief Complaint:left knee pain.  HPI: Isabella Oliver, 66 y.o. female, has a history of pain and functional disability in the left knee due to arthritis and has failed non-surgical conservative treatments for greater than 12 weeks to includeNSAID's and/or analgesics, corticosteriod injections, viscosupplementation injections, weight reduction as appropriate and activity modification.  Onset of symptoms was gradual, starting 5 years ago with gradually worsening course since that time. The patient noted no past surgery on the left knee(s).  Patient currently rates pain in the left knee(s) at 9 out of 10 with activity. Patient has night pain, worsening of pain with activity and weight bearing, pain that interferes with activities of daily living, pain with passive range of motion, crepitus and joint swelling.  Patient has evidence of subchondral cysts, subchondral sclerosis, joint subluxation and joint space narrowing by imaging studies. This patient has had Failure of all reasonable conservative care. There is no active infection. The patient has a history of high blood pressure, asthma, and diabetes. I anticipate that she will be here for a minimum of 2 midnights as a direct consequence of her premorbid conditions and will need monitoring to make sure that she is stable to be discharged home.  Patient Active Problem List   Diagnosis Date Noted  . Arthritis 10/30/2013  . Type 2 diabetes mellitus without complication ([place]) 34/28/7681  . Essential hypertension 10/30/2013  . Hyperlipidemia 10/30/2013   Past Medical History:  Diagnosis Date  . Allergy   . Arthritis   . Asthma   . Diabetes mellitus without complication ([place])   . Heart murmur    Grade I/VI 12/2013 at [facility] office  . Hypertension     Past Surgical History:  Procedure Laterality Date  . HAND SURGERY    . TUBAL LIGATION    .  TUBAL LIGATION      Prescriptions Prior to Admission  Medication Sig Dispense Refill Last Dose  . acetaminophen (TYLENOL) 500 MG tablet Take 500 mg by mouth every 6 (six) hours as needed.   02/20/2016 at 0800  . albuterol (PROVENTIL HFA;VENTOLIN HFA) 108 (90 Base) MCG/ACT inhaler Inhale 1-2 puffs into the lungs every 6 (six) hours as needed for wheezing or shortness of breath.   02/20/2016 at 0800  . amLODipine (NORVASC) 10 MG tablet Take 10 mg by mouth daily.   02/20/2016 at 0800  . aspirin EC 81 MG tablet Take 81 mg by mouth daily.   02/19/2016 at Unknown time  . Azilsartan-Chlorthalidone (EDARBYCLOR) 40-25 MG TABS Take 1 tablet by mouth daily.   02/19/2016 at Unknown time  . ezetimibe (ZETIA) 10 MG tablet Take 10 mg by mouth daily.   02/19/2016 at Unknown time  . metFORMIN (GLUCOPHAGE) 500 MG tablet Take 250 mg by mouth 2 (two) times daily.   02/19/2016 at Unknown time  . traMADol (ULTRAM) 50 MG tablet Take 50 mg by mouth every 6 (six) hours as needed for pain.  0 02/20/2016 at 0800  . fexofenadine-pseudoephedrine (ALLEGRA-D) 60-120 MG 12 hr tablet Take 1 tablet by mouth every 12 (twelve) hours. (Patient not taking: Reported on 02/13/2016) 30 tablet 0 Not Taking at Unknown time  . fluticasone (FLONASE) 50 MCG/ACT nasal spray Place 1 spray into both nostrils daily. (Patient not taking: Reported on 02/13/2016) 16 g 2 Not Taking at Unknown time   Allergies  Allergen Reactions  . Food Swelling and Other (See Comments)  CUCUMBERS TONGUE SWELLING  . Codeine Nausea Only  . Methocarbamol Nausea Only  . Tramadol Nausea Only    Patient states she tolerates    Social History  Substance Use Topics  . Smoking status: Former Research scientist (life sciences)  . Smokeless tobacco: Never Used  . Alcohol use No    Family History  Problem Relation Age of Onset  . Lung disease Mother   . Hypertension Father   . Cancer Sister   . Diabetes Sister   . Diabetes Brother   . Heart disease Brother   . Cancer Maternal Grandmother   .  Cancer Maternal Grandfather      ROS ROS: I have reviewed the patient's review of systems thoroughly and there are no positive responses as relates to the HPI. Objective:  Physical Exam  Vital signs in last 24 hours: Temp:  [97.7 F (36.5 C)] 97.7 F (36.5 C) (02/12 1027) Pulse Rate:  [90] 90 (02/12 1027) Resp:  [20] 20 (02/12 1027) BP: (177)/(62) 177/62 (02/12 1027) SpO2:  [100 %] 100 % (02/12 1027) Weight:  [88 kg (194 lb)] 88 kg (194 lb) (02/12 1027) Well-developed well-nourished patient in no acute distress. Alert and oriented x3 HEENT:within normal limits Cardiac: Regular rate and rhythm Pulmonary: Lungs clear to auscultation Abdomen: Soft and nontender.  Normal active bowel sounds  Musculoskeletal: l knee: Painful range of motion. Tender palpation over the medial joint line. No instability. 2+ effusion. Pain and grinding through range of motion. Labs: Recent Results (from the past 2160 hour(s))  Glucose, capillary     Status: Abnormal   Collection Time: 02/15/16  2:01 PM  Result Value Ref Range   Glucose-Capillary 129 (H) 65 - 99 mg/dL  APTT     Status: None   Collection Time: 02/15/16  2:27 PM  Result Value Ref Range   aPTT 28 24 - 36 seconds  CBC WITH DIFFERENTIAL     Status: Abnormal   Collection Time: 02/15/16  2:27 PM  Result Value Ref Range   WBC 8.4 4.0 - 10.5 K/uL   RBC 3.76 (L) 3.87 - 5.11 MIL/uL   Hemoglobin 11.2 (L) 12.0 - 15.0 g/dL   HCT 33.2 (L) 36.0 - 46.0 %   MCV 88.3 78.0 - 100.0 fL   MCH 29.8 26.0 - 34.0 pg   MCHC 33.7 30.0 - 36.0 g/dL   RDW 13.6 11.5 - 15.5 %   Platelets 413 (H) 150 - 400 K/uL   Neutrophils Relative % 64 %   Neutro Abs 5.4 1.7 - 7.7 K/uL   Lymphocytes Relative 27 %   Lymphs Abs 2.2 0.7 - 4.0 K/uL   Monocytes Relative 6 %   Monocytes Absolute 0.5 0.1 - 1.0 K/uL   Eosinophils Relative 3 %   Eosinophils Absolute 0.3 0.0 - 0.7 K/uL   Basophils Relative 0 %   Basophils Absolute 0.0 0.0 - 0.1 K/uL  Comprehensive metabolic  panel     Status: Abnormal   Collection Time: 02/15/16  2:27 PM  Result Value Ref Range   Sodium 138 135 - 145 mmol/L   Potassium 3.4 (L) 3.5 - 5.1 mmol/L   Chloride 100 (L) 101 - 111 mmol/L   CO2 26 22 - 32 mmol/L   Glucose, Bld 117 (H) 65 - 99 mg/dL   BUN 14 6 - 20 mg/dL   Creatinine, Ser 0.91 0.44 - 1.00 mg/dL   Calcium 10.3 8.9 - 10.3 mg/dL   Total Protein 7.6 6.5 - 8.1 g/dL  Albumin 3.6 3.5 - 5.0 g/dL   AST 20 15 - 41 U/L   ALT 14 14 - 54 U/L   Alkaline Phosphatase 66 38 - 126 U/L   Total Bilirubin 0.2 (L) 0.3 - 1.2 mg/dL   GFR calc non Af Amer >60 >60 mL/min   GFR calc Af Amer >60 >60 mL/min    Comment: (NOTE) The eGFR has been calculated using the CKD EPI equation. This calculation has not been validated in all clinical situations. eGFR's persistently <60 mL/min signify possible Chronic Kidney Disease.    Anion gap 12 5 - 15  Protime-INR     Status: None   Collection Time: 02/15/16  2:27 PM  Result Value Ref Range   Prothrombin Time 13.9 11.4 - 15.2 seconds   INR 1.06   Urinalysis, Routine w reflex microscopic     Status: Abnormal   Collection Time: 02/15/16  2:27 PM  Result Value Ref Range   Color, Urine YELLOW YELLOW   APPearance HAZY (A) CLEAR   Specific Gravity, Urine 1.015 1.005 - 1.030   pH 5.0 5.0 - 8.0   Glucose, UA NEGATIVE NEGATIVE mg/dL   Hgb urine dipstick NEGATIVE NEGATIVE   Bilirubin Urine NEGATIVE NEGATIVE   Ketones, ur NEGATIVE NEGATIVE mg/dL   Protein, ur NEGATIVE NEGATIVE mg/dL   Nitrite NEGATIVE NEGATIVE   Leukocytes, UA NEGATIVE NEGATIVE  Surgical pcr screen     Status: Abnormal   Collection Time: 02/15/16  2:49 PM  Result Value Ref Range   MRSA, PCR NEGATIVE NEGATIVE   Staphylococcus aureus POSITIVE (A) NEGATIVE    Comment:        The Xpert SA Assay (FDA approved for NASAL specimens in patients over 4 years of age), is one component of a comprehensive surveillance program.  Test performance has been validated by [facility] for  patients greater than or equal to 60 year old. It is not intended to diagnose infection nor to guide or monitor treatment.   Hemoglobin A1c     Status: Abnormal   Collection Time: 02/15/16  2:50 PM  Result Value Ref Range   Hgb A1c MFr Bld 6.9 (H) 4.8 - 5.6 %    Comment: (NOTE)         Pre-diabetes: 5.7 - 6.4         Diabetes: >6.4         Glycemic control for adults with diabetes: <7.0    Mean Plasma Glucose 151 mg/dL    Comment: (NOTE) Performed At: [facility] [place], [place] [id number] Lindon Romp MD [id number]   Type and screen Order type and screen if day of surgery is less than 15 days from draw of preadmission visit or order morning of surgery if day of surgery is greater than 6 days from preadmission visit.     Status: None   Collection Time: 02/15/16  2:55 PM  Result Value Ref Range   ABO/RH(D) O POS    Antibody Screen NEG    Sample Expiration 02/29/2016    Extend sample reason NO TRANSFUSIONS OR PREGNANCY IN THE PAST 3 MONTHS   ABO/Rh     Status: None   Collection Time: 02/15/16  2:55 PM  Result Value Ref Range   ABO/RH(D) O POS   Glucose, capillary     Status: Abnormal   Collection Time: 02/20/16 10:34 AM  Result Value Ref Range   Glucose-Capillary 168 (H) 65 - 99 mg/dL   Comment 1 Notify  RN    Comment 2 Document in Chart      Estimated body mass index is 34.37 kg/m as calculated from the following:   Height as of 02/15/16: 5' 3"  (1.6 m).   Weight as of this encounter: 88 kg (194 lb).   Imaging Review Plain radiographs demonstrate severe degenerative joint disease of the left knee(s). The overall alignment ismild varus. The bone quality appears to be good for age and reported activity level.  Assessment/Plan:  End stage arthritis, left knee   The patient history, physical examination, clinical judgment of the provider and imaging studies are consistent with end stage degenerative joint disease of the left knee(s) and  total knee arthroplasty is deemed medically necessary. The treatment options including medical management, injection therapy arthroscopy and arthroplasty were discussed at length. The risks and benefits of total knee arthroplasty were presented and reviewed. The risks due to aseptic loosening, infection, stiffness, patella tracking problems, thromboembolic complications and other imponderables were discussed. The patient acknowledged the explanation, agreed to proceed with the plan and consent was signed. Patient is being admitted for inpatient treatment for surgery, pain control, PT, OT, prophylactic antibiotics, VTE prophylaxis, progressive ambulation and ADL's and discharge planning. The patient is planning to be discharged home with home health services after 2 midnight's of hospitalization

## 2016-02-20 NOTE — Anesthesia Procedure Notes (Signed)
Spinal  Patient location during procedure: OR Start time: 02/20/2016 1:01 PM End time: 02/20/2016 1:08 PM Staffing Anesthesiologist: Heather RobertsSINGER, Isabella Ryle Performed: anesthesiologist  Preanesthetic Checklist Completed: patient identified, surgical consent, pre-op evaluation, timeout performed, IV checked, risks and benefits discussed and monitors and equipment checked Spinal Block Patient position: sitting Prep: DuraPrep Patient monitoring: cardiac monitor, continuous pulse ox and blood pressure Approach: midline Location: L2-3 Injection technique: single-shot Needle Needle type: Pencan  Needle gauge: 24 G Needle length: 9 cm Additional Notes Functioning IV was confirmed and monitors were applied. Sterile prep and drape, including hand hygiene and sterile gloves were used. The patient was positioned and the spine was prepped. The skin was anesthetized with lidocaine.  Free flow of clear CSF was obtained prior to injecting local anesthetic into the CSF.  The spinal needle aspirated freely following injection.  The needle was carefully withdrawn.  The patient tolerated the procedure well.

## 2016-02-20 NOTE — Transfer of Care (Signed)
Immediate Anesthesia Transfer of Care Note  Patient: Isabella Oliver  Procedure(s) Performed: Procedure(s): TOTAL KNEE ARTHROPLASTY (Left)  Patient Location: PACU  Anesthesia Type:MAC and Spinal  Level of Consciousness: awake, oriented and patient cooperative  Airway & Oxygen Therapy: Patient Spontanous Breathing and Patient connected to face mask oxygen  Post-op Assessment: Report given to RN and Post -op Vital signs reviewed and stable  Post vital signs: Reviewed  Last Vitals:  Vitals:   02/20/16 1027  BP: (!) 177/62  Pulse: 90  Resp: 20  Temp: 36.5 C    Last Pain:  Vitals:   02/20/16 1051  TempSrc:   PainSc: 6       Patients Stated Pain Goal: 4 (75/64/33 2951)  Complications: No apparent anesthesia complications

## 2016-02-20 NOTE — Anesthesia Preprocedure Evaluation (Addendum)
Anesthesia Evaluation  Patient identified by MRN, date of birth, ID band Patient awake    Reviewed: Allergy & Precautions, NPO status , Patient's Chart, lab work & pertinent test results  History of Anesthesia Complications Negative for: history of anesthetic complications  Airway Mallampati: II  TM Distance: >3 FB Neck ROM: Full    Dental  (+) Missing, Dental Advisory Given   Pulmonary asthma , former smoker,    Pulmonary exam normal        Cardiovascular hypertension, Pt. on medications Normal cardiovascular exam     Neuro/Psych negative neurological ROS  negative psych ROS   GI/Hepatic negative GI ROS, Neg liver ROS,   Endo/Other  diabetesMorbid obesity  Renal/GU negative Renal ROS     Musculoskeletal   Abdominal   Peds  Hematology   Anesthesia Other Findings   Reproductive/Obstetrics                            Anesthesia Physical Anesthesia Plan  ASA: III  Anesthesia Plan: MAC and Spinal   Post-op Pain Management:  Regional for Post-op pain   Induction:   Airway Management Planned: Natural Airway and Simple Face Mask  Additional Equipment:   Intra-op Plan:   Post-operative Plan: Extubation in OR  Informed Consent: I have reviewed the patients History and Physical, chart, labs and discussed the procedure including the risks, benefits and alternatives for the proposed anesthesia with the patient or authorized representative who has indicated his/her understanding and acceptance.   Dental advisory given  Plan Discussed with: CRNA and Anesthesiologist  Anesthesia Plan Comments:       Anesthesia Quick Evaluation

## 2016-02-20 NOTE — Anesthesia Postprocedure Evaluation (Signed)
Anesthesia Post Note  Patient: Isabella Oliver  Procedure(s) Performed: Procedure(s) (LRB): TOTAL KNEE ARTHROPLASTY (Left)  Patient location during evaluation: PACU Anesthesia Type: MAC Level of consciousness: oriented and awake and alert Pain management: pain level controlled Vital Signs Assessment: post-procedure vital signs reviewed and stable Respiratory status: spontaneous breathing, respiratory function stable and patient connected to nasal cannula oxygen Cardiovascular status: blood pressure returned to baseline and stable Postop Assessment: no headache and no backache Anesthetic complications: no       Last Vitals:  Vitals:   02/20/16 1555 02/20/16 1600  BP: 140/65   Pulse: 80 79  Resp: 13 14  Temp:      Last Pain:  Vitals:   02/20/16 1555  TempSrc:   PainSc: [place]

## 2016-02-20 NOTE — Brief Op Note (Signed)
02/20/2016  2:45 PM  PATIENT:  Isabella BourgeoisZenobia F Oliver  66 y.o. female  PRE-OPERATIVE DIAGNOSIS:  OSTEOARTHRITIS LEFT KNEE  POST-OPERATIVE DIAGNOSIS:  OSTEOARTHRITIS LEFT KNEE  PROCEDURE:  Procedure(s): TOTAL KNEE ARTHROPLASTY (Left)  SURGEON:  Surgeon(s) and Role:    * Jodi GeraldsJohn Zadiel Leyh, MD - Primary  PHYSICIAN ASSISTANT:   ASSISTANTS: bethune pac   ANESTHESIA:   spinal  EBL:  Total I/O In: 1750 [I.V.:1750] Out: 50 [Blood:50]  BLOOD ADMINISTERED:none  DRAINS: none   LOCAL MEDICATIONS USED:  MARCAINE     SPECIMEN:  No Specimen  DISPOSITION OF SPECIMEN:  N/A  COUNTS:  YES  TOURNIQUET:   Total Tourniquet Time Documented: Thigh (Left) - 65 minutes Total: Thigh (Left) - 65 minutes   DICTATION: .Other Dictation: Dictation Number [id number]  PLAN OF CARE: Admit to inpatient   PATIENT DISPOSITION:  PACU - hemodynamically stable.   Delay start of Pharmacological VTE agent (>24hrs) due to surgical blood loss or risk of bleeding: no

## 2016-02-20 NOTE — Anesthesia Procedure Notes (Signed)
Anesthesia Regional Block:  Adductor canal block  Pre-Anesthetic Checklist: ,, timeout performed, Correct Patient, Correct Site, Correct Laterality, Correct Procedure, Correct Position, site marked, Risks and benefits discussed,  Surgical consent,  Pre-op evaluation,  At surgeon's request and post-op pain management  Laterality: Left  Prep: chloraprep       Needles:  Injection technique: Single-shot  Needle Type: Stimulator Needle - 80     Needle Length: 10cm 10 cm Needle Gauge: 21 and 21 G    Additional Needles:  Procedures: ultrasound guided (picture in chart) Adductor canal block Narrative:  Start time: 02/20/2016 11:38 AM End time: 02/20/2016 11:48 AM Injection made incrementally with aspirations every 5 mL.  Performed by: Personally

## 2016-02-20 NOTE — Discharge Instructions (Signed)

## 2016-02-21 ENCOUNTER — Encounter (HOSPITAL_COMMUNITY): Payer: Self-pay | Admitting: Orthopedic Surgery

## 2016-02-21 LAB — CBC
HEMATOCRIT: 33.7 % — AB (ref 36.0–46.0)
HEMOGLOBIN: 11.4 g/dL — AB (ref 12.0–15.0)
MCH: 29.5 pg (ref 26.0–34.0)
MCHC: 33.8 g/dL (ref 30.0–36.0)
MCV: 87.3 fL (ref 78.0–100.0)
Platelets: 395 10*3/uL (ref 150–400)
RBC: 3.86 MIL/uL — ABNORMAL LOW (ref 3.87–5.11)
RDW: 13.4 % (ref 11.5–15.5)
WBC: 11.5 10*3/uL — ABNORMAL HIGH (ref 4.0–10.5)

## 2016-02-21 LAB — GLUCOSE, CAPILLARY
GLUCOSE-CAPILLARY: 225 mg/dL — AB (ref 65–99)
GLUCOSE-CAPILLARY: 194 mg/dL — AB (ref 65–99)
Glucose-Capillary: 237 mg/dL — ABNORMAL HIGH (ref 65–99)
Glucose-Capillary: 163 mg/dL — ABNORMAL HIGH (ref 65–99)

## 2016-02-21 LAB — BASIC METABOLIC PANEL
ANION GAP: 15 (ref 5–15)
BUN: 9 mg/dL (ref 6–20)
CALCIUM: 9.5 mg/dL (ref 8.9–10.3)
CHLORIDE: 91 mmol/L — AB (ref 101–111)
CO2: 27 mmol/L (ref 22–32)
CREATININE: 0.77 mg/dL (ref 0.44–1.00)
GFR calc Af Amer: >60 mL/min (ref >60)
GFR calc non Af Amer: >60 mL/min (ref >60)
GLUCOSE: 229 mg/dL — AB (ref 65–99)
Potassium: 3.4 mmol/L — ABNORMAL LOW (ref 3.5–5.1)
Sodium: 133 mmol/L — ABNORMAL LOW (ref 135–145)

## 2016-02-21 NOTE — Op Note (Signed)
NAME:  Tye SavoyHOMAS, Isabella              ACCOUNT NO.:  [id number]  MEDICAL RECORD NO.:  [id number]  LOCATION:                                 FACILITY:  PHYSICIAN:  Harvie JuniorJohn L. Justun Anaya, M.D.   DATE OF BIRTH:  February 14, 1950  DATE OF PROCEDURE:  02/20/2016 DATE OF DISCHARGE:                              OPERATIVE REPORT   PREOPERATIVE DIAGNOSIS:  End-stage degenerative joint disease, left knee.  POSTOPERATIVE DIAGNOSIS:  End-stage degenerative joint disease, left knee.  PROCEDURE:  Left total knee replacement with Attune system size 4 femur, size 4 tibia, size 5 bridging bearing, and a 32 mm all polyethylene patella.  SURGEON:  Harvie JuniorJohn L. Maanya Hippert, M.D.  ASSISTANT:  Marshia LyJames Bethune, PA.  ANESTHESIA:  General.  BRIEF HISTORY:  Isabella Oliver is a 66 year old female with long history of significant complaints of left knee pain.  She had been treated conservatively for prolonged period of time with activity modification, injection therapy, and after failure of all conservative care x-ray showing bone-on-bone change and the patient was having night pain, light activity pain, she has taken to the operating room for left total knee replacement.  DESCRIPTION OF PROCEDURE:  The patient was taken to the operative procedure.  After adequate anesthesia was obtained with spinal anesthetic, the patient was placed supine on the operating table.  The left leg was then prepped and draped in the usual sterile fashion. Following this, leg was exsanguinated.  Blood pressure tourniquet was inflated to 300 mmHg.  Following this, a midline incision was made in the subcutaneous tissue down the extensor mechanism.  A medial parapatellar arthrotomy was undertaken.  Following this, medial and lateral meniscus were removed, retropatellar fat pad, synovium on the anterior aspect of the femur, and anterior and posterior cruciate.  Once this was done, an intramedullary pilot hole was drilled in the femur followed by an  intramedullary alignment guide.  We set it at 4 degree valgus inclination and 9 mm of distal bone was resected following this. Attention was turned towards sizing the femur.  It was sized to a 4. Anterior and posterior cuts were made, chamfers and box.  Attention turned to the tibia, sized to a 4.  It was drilled and keeled. Afterwards cut perpendicular to its long axis creating a flat surface. Once this was done, the trial components were put into place.  Size 4 tibia, size 4 femur, 5 mm bridging bearing trial was trialed, and attention turned to the patella.  This was cut down to a level of 13 mm and a 32 poly was chosen and lugs were drilled for this.  Once this was done, the trial poly was placed and knee put through a range of motion. Excellent stability and range of motion were achieved.  Following this, all trial components were removed.  The knee was copiously and thoroughly lavaged with pulse lavage irrigation and suctioned dry.  The final components were then cemented in place size 4 tibia, size 4 femur, size 5 implant trial, and a 38 all poly patella placed and held with a clamp.  All excess bone cement removed.  The cement was allowed to completely harden.  Once this was done,  the tourniquet was let down.  We then trialed a 5 with flexion, mid flexion, and all of this seems to be the appropriate size.  Once this was done, the trial 5 was removed.  The final 5 was placed and all bleeding had been controlled with electrocautery during that time.  The patient was given tranexamic acid IV preoperatively.  We used intraoperative of tranexamic acid and we put 60 mL of Exparel with 40 of 0.25% Marcaine throughout the synovial reflection for postoperative anesthesia.  At this point, the patient had the knee copiously and thoroughly irrigated, suctioned dry.  The final poly was placed as outlined above and the knee put through a range of motion.  Excellent stability and range of motion  were achieved at this point.  The medial parapatellar arthrotomy was closed with 1 Vicryl running, skin with 2-0 Vicryl, and 3-0 Monocryl subcuticular.  Benzoin and Steri-Strips were applied.  Sterile compressive dressing was applied.  The patient was taken to the recovery room and noted to be in satisfactory condition.  Estimated blood loss for the procedure is minimal.     Harvie Junior, M.D.   ______________________________ Harvie Junior, M.D.    Ranae Plumber  D:  02/20/2016  T:  02/20/2016  Job:  [id number]

## 2016-02-21 NOTE — Progress Notes (Signed)
Subjective: 1 Day Post-Op Procedure(s) (LRB): TOTAL KNEE ARTHROPLASTY (Left) Patient reports pain as moderate.  Foley out this morning. Has already voided without difficulty. Taking by mouth okay. Has been up with physical therapy  Objective: Vital signs in last 24 hours: Temp:  [97.6 F (36.4 C)-98.6 F (37 C)] 98.1 F (36.7 C) (02/13 0358) Pulse Rate:  [77-104] 103 (02/13 0358) Resp:  [11-32] 18 (02/13 0358) BP: (131-195)/(55-81) 165/81 (02/13 0358) SpO2:  [93 %-100 %] 94 % (02/13 0358) Weight:  [88 kg (194 lb)] 88 kg (194 lb) (02/12 1027)  Intake/Output from previous day: 02/12 0701 - 02/13 0700 In: 2170 [I.V.:2170] Out: 2950 [Urine:2900; Blood:50] Intake/Output this shift: No intake/output data recorded.   Recent Labs  02/21/16 0331  HGB 11.4*    Recent Labs  02/21/16 0331  WBC 11.5*  RBC 3.86*  HCT 33.7*  PLT 395    Recent Labs  02/21/16 0331  NA 133*  K 3.4*  CL 91*  CO2 27  BUN 9  CREATININE 0.77  GLUCOSE 229*  CALCIUM 9.5   No results for input(s): LABPT, INR in the last 72 hours. Left knee exam: Neurovascular intact Sensation intact distally Intact pulses distally Dorsiflexion/Plantar flexion intact Incision: dressing C/D/I Compartment soft  Assessment/Plan: 1 Day Post-Op Procedure(s) (LRB): TOTAL KNEE ARTHROPLASTY (Left) Plan: Aspirin 325 mg twice daily for DVT prophylaxis 1 month postop. Weight-bear as tolerated on left. CPM 0 to 70 increase 5 or 10 daily. Use 6-8 hours per day. Up with therapy Plan for discharge tomorrow with home health physical therapy.  Aloysious Vangieson G 02/21/2016, 9:42 AM

## 2016-02-21 NOTE — Progress Notes (Signed)
Physical Therapy Treatment Patient Details Name: Isabella BourgeoisZenobia F Oliver MRN: [id number] DOB: 1950/11/08 Today's Date: 02/21/2016    History of Present Illness Admitted for LTKA, WBAT, KI OOB;  has a past medical history of Allergy; Arthritis; Asthma; Diabetes mellitus without complication (HCC); Heart murmur; and Hypertension.  has a past surgical history that includes Hand surgery.    PT Comments    Overall good progress with functional mobility; Pain continues to be the issue limiting knee ROM, though she was premedicated; ultimately pt opted for CPM over more therex  Follow Up Recommendations  Home health PT     Equipment Recommendations  3in1 (PT)    Recommendations for Other Services OT consult     Precautions / Restrictions Precautions Precautions: Knee Precaution Booklet Issued: Yes (comment) Precaution Comments: Pt educated to not allow any pillow or bolster under knee for healing with optimal range of motion.  Required Braces or Orthoses: Knee Immobilizer - Left Knee Immobilizer - Left: On when out of bed or walking Restrictions LLE Weight Bearing: Weight bearing as tolerated    Mobility  Bed Mobility Overal bed mobility: Needs Assistance Bed Mobility: Sit to Supine       Sit to supine: Min guard   General bed mobility comments: Minguard assist for safety; able to lift LE into bed without assist  Transfers Overall transfer level: Needs assistance Equipment used: Rolling walker (2 wheeled) Transfers: Sit to/from Stand Sit to Stand: Min guard         General transfer comment: more steadying assist coming up from commode; cues for hand placement  Ambulation/Gait Ambulation/Gait assistance: Min guard Ambulation Distance (Feet): 20 Feet Assistive device: Rolling walker (2 wheeled) Gait Pattern/deviations: Step-through pattern (emerging)     General Gait Details: Cues for gait sequence and to activate L quad for stance stabiltiy   Stairs             Wheelchair Mobility    Modified Rankin (Stroke Patients Only)       Balance                                    Cognition Arousal/Alertness: Awake/alert Behavior During Therapy: WFL for tasks assessed/performed Overall Cognitive Status: Within Functional Limits for tasks assessed                      Exercises Total Joint Exercises Quad Sets: AROM;Left;5 reps Goniometric ROM: Approx 5-60 deg    General Comments        Pertinent Vitals/Pain Pain Assessment: 0-10 Pain Score: 9  Pain Location: L knee post assessment and ambulation Pain Descriptors / Indicators: Aching;Grimacing;Guarding Pain Intervention(s): Premedicated before session    Home Living                      Prior Function            PT Goals (current goals can now be found in the care plan section) Acute Rehab PT Goals Patient Stated Goal: back to shopping (she likes [name]) PT Goal Formulation: With patient Time For Goal Achievement: 02/28/16 Potential to Achieve Goals: Good Progress towards PT goals: Progressing toward goals    Frequency    7X/week      PT Plan Current plan remains appropriate    Co-evaluation             End of Session Equipment Utilized  During Treatment: Gait belt;Left knee immobilizer Activity Tolerance: Patient tolerated treatment well Patient left: with call bell/phone within reach;in bed;in CPM;with family/visitor present     Time: 1610-9604 PT Time Calculation (min) (ACUTE ONLY): 17 min  Charges:  $Therapeutic Activity: 8-22 mins                    G Codes:      Levi Aland March 08, 2016, 5:07 PM  Van Clines, PT  Acute Rehabilitation Services Pager [phone] Office [phone]

## 2016-02-21 NOTE — Evaluation (Signed)
Physical Therapy Evaluation Patient Details Name: Isabella Oliver MRN: [id number] DOB: May 31, 1950 Today's Date: 02/21/2016   History of Present Illness  Admitted for LTKA, WBAT, KI OOB;  has a past medical history of Allergy; Arthritis; Asthma; Diabetes mellitus without complication (HCC); Heart murmur; and Hypertension.  has a past surgical history that includes Hand surgery.  Clinical Impression   Pt is s/p TKA resulting in the deficits listed below (see PT Problem List). Overall moving well despite considerable pain L knee this am; tells me she will have around the clock help from her son and daughter for a few days starting tomorrow;  Pt will benefit from skilled PT to increase their independence and safety with mobility to allow discharge to the venue listed below.      Follow Up Recommendations Home health PT    Equipment Recommendations  3in1 (PT)    Recommendations for Other Services OT consult     Precautions / Restrictions Precautions Precautions: Knee Precaution Booklet Issued: Yes (comment) Precaution Comments: Pt educated to not allow any pillow or bolster under knee for healing with optimal range of motion.  Required Braces or Orthoses: Knee Immobilizer - Left Knee Immobilizer - Left: On when out of bed or walking Restrictions LLE Weight Bearing: Weight bearing as tolerated      Mobility  Bed Mobility Overal bed mobility: Needs Assistance Bed Mobility: Supine to Sit     Supine to sit: Min assist     General bed mobility comments: Min handheld assist to pull to sit  Transfers Overall transfer level: Needs assistance Equipment used: Rolling walker (2 wheeled) Transfers: Sit to/from Stand Sit to Stand: Mod assist;Min assist         General transfer comment: Light mod assist to power up the initial time coming to stand less power up assist and more steadying assist coming up from commode  Ambulation/Gait Ambulation/Gait assistance: Min  guard Ambulation Distance (Feet): 50 Feet Assistive device: Rolling walker (2 wheeled) Gait Pattern/deviations: Step-through pattern (emerging)     General Gait Details: Cues for gait sequence and to activate L quad for stance stabiltiy  Stairs            Wheelchair Mobility    Modified Rankin (Stroke Patients Only)       Balance                                             Pertinent Vitals/Pain Pain Assessment: 0-10 Pain Score: 8  Pain Location: L knee post assessment and ambulation Pain Descriptors / Indicators: Aching;Grimacing;Guarding Pain Intervention(s): Limited activity within patient's tolerance;Monitored during session;Ice applied    Home Living Family/patient expects to be discharged to:: Private residence Living Arrangements: Alone Available Help at Discharge: Family;Available 24 hours/day (son and daughter are local) Type of Home: Apartment Home Access: Stairs to enter Entrance Stairs-Rails: None Entrance Stairs-Number of Steps: 2 Home Layout: One level Home Equipment: Environmental consultant - 2 wheels      Prior Function Level of Independence: Independent         Comments: Likes to shop     Hand Dominance        Extremity/Trunk Assessment   Upper Extremity Assessment Upper Extremity Assessment: Defer to OT evaluation    Lower Extremity Assessment Lower Extremity Assessment: LLE deficits/detail LLE Deficits / Details: Grossly decr AROM and strength, limited by pain postop; very  limited flexion secondary to muscle guarding with pain; requested pain meds LLE: Unable to fully assess due to pain    Cervical / Trunk Assessment Cervical / Trunk Assessment: Normal  Communication   Communication: No difficulties  Cognition Arousal/Alertness: Awake/alert Behavior During Therapy: WFL for tasks assessed/performed Overall Cognitive Status: Within Functional Limits for tasks assessed                      General Comments  General comments (skin integrity, edema, etc.): Notified Ortho Tech that pt did not get her CPM overnight    Exercises     Assessment/Plan    PT Assessment Patient needs continued PT services  PT Problem List Decreased strength;Decreased range of motion;Decreased activity tolerance;Decreased balance;Decreased mobility;Decreased coordination;Decreased knowledge of use of DME;Decreased knowledge of precautions;Pain          PT Treatment Interventions DME instruction;Gait training;Stair training;Functional mobility training;Therapeutic activities;Therapeutic exercise;Patient/family education    PT Goals (Current goals can be found in the Care Plan section)  Acute Rehab PT Goals Patient Stated Goal: back to shopping (she likes Isabella ArbourJC Oliver) PT Goal Formulation: With patient Time For Goal Achievement: 02/28/16 Potential to Achieve Goals: Good    Frequency 7X/week   Barriers to discharge        Co-evaluation               End of Session Equipment Utilized During Treatment: Gait belt;Left knee immobilizer Activity Tolerance: Patient tolerated treatment well Patient left: in chair;with call bell/phone within reach Nurse Communication: Mobility status         Time: [id number] PT Time Calculation (min) (ACUTE ONLY): 30 min   Charges:   PT Evaluation $PT Eval Low Complexity: 1 Procedure PT Treatments $Gait Training: 8-22 mins   PT G Codes:        Isabella Oliver 02/21/2016, 9:58 AM  Van ClinesHolly Beverly Oliver, PT  Acute Rehabilitation Services Pager [phone] Office [phone]

## 2016-02-21 NOTE — Care Management Note (Addendum)
Case Management Note  Patient Details  Name: Isabella BourgeoisZenobia F Oliver MRN: [id number] Date of Birth: 26-Sep-1950  Subjective/Objective:   66 yr old female s/p left total knee arthroplasty.                 Action/Plan: Case manager spoke with patient Discharge plans and DME . Choice was offered for Home Health Agency. CM called referral to Angela Adamrew Wilkie, [facility]. Rolling walker, 3in1 and CPM have been delivered to patient's home. Patient states her son and daughter will assist her at discharge.   Expected Discharge Date:   02/22/16               Expected Discharge Plan:  Home w Home Health Services  In-House Referral:  NA  Discharge planning Services  CM Consult  Post Acute Care Choice:  Home Health, Durable Medical Equipment Choice offered to:  Patient  DME Arranged:  3-N-1, CPM, Walker rolling DME Agency:  [employer]  HH Arranged:  PT HH Agency:  [facility] Status of Service:  Completed, signed off  If discussed at Long Length of Stay Meetings, dates discussed:    Additional Comments:CM received call from Angela Adamrew Wil[facility], [facility], stating that they are unable to provide HHPT. CM called referral to Janeice RobinsonKaren Nusbaumm, [facility] Liaison and they will be able to provide therapy.  Durenda GuthrieBrady, Kysean Sweet Naomi, RN 02/21/2016, 3:08 PM

## 2016-02-22 LAB — CBC
HEMATOCRIT: 33.4 % — AB (ref 36.0–46.0)
Hemoglobin: 11.5 g/dL — ABNORMAL LOW (ref 12.0–15.0)
MCH: 29.5 pg (ref 26.0–34.0)
MCHC: 34.4 g/dL (ref 30.0–36.0)
MCV: 85.6 fL (ref 78.0–100.0)
Platelets: 391 10*3/uL (ref 150–400)
RBC: 3.9 MIL/uL (ref 3.87–5.11)
RDW: 13.7 % (ref 11.5–15.5)
WBC: 15.5 10*3/uL — ABNORMAL HIGH (ref 4.0–10.5)

## 2016-02-22 LAB — GLUCOSE, CAPILLARY
GLUCOSE-CAPILLARY: 201 mg/dL — AB (ref 65–99)
GLUCOSE-CAPILLARY: 159 mg/dL — AB (ref 65–99)

## 2016-02-22 NOTE — Discharge Summary (Signed)
Patient ID: Isabella Oliver MRN: [id number] DOB/AGE: 09-Apr-1950 66 y.o.  Admit date: 02/20/2016 Discharge date: 02/22/2016  Admission Diagnoses:  Principal Problem:   Primary osteoarthritis of left knee Active Problems:   Type 2 diabetes mellitus without complication [facility](HCC)   Discharge Diagnoses:  Same  Past Medical History:  Diagnosis Date  . Allergy   . Arthritis   . Asthma   . Diabetes mellitus without complication (HCC)   . Heart murmur    [facility]  . Hypertension     Surgeries: Procedure(s):LEFT TOTAL KNEE ARTHROPLASTY on 02/20/2016    Discharged Condition: Improved  Hospital Course: Isabella Oliver is an 66 y.o. female who was admitted 02/20/2016 for operative treatment ofPrimary osteoarthritis of left knee. Patient has severe unremitting pain that affects sleep, daily activities, and work/hobbies. After pre-op clearance the patient was taken to the operating room on 02/20/2016 and underwent  Procedure(s):LEFT TOTAL KNEE ARTHROPLASTY.    Patient was given perioperative antibiotics: Anti-infectives    Start     Dose/Rate Route Frequency Ordered Stop   02/20/16 1830  ceFAZolin (ANCEF) IVPB 2g/100 mL premix     2 g 200 mL/hr over 30 Minutes Intravenous Every 6 hours 02/20/16 1734 02/21/16 0222   02/20/16 1023  ceFAZolin (ANCEF) 2-4 GM/100ML-% IVPB    Comments:  Lonia ChimeraMoore, Kristi   : cabinet override      02/20/16 1023 02/20/16 1307   02/20/16 1022  ceFAZolin (ANCEF) IVPB 2g/100 mL premix     2 g 200 mL/hr over 30 Minutes Intravenous On call to O.R. 02/20/16 1022 02/20/16 1322       Patient was given sequential compression devices, early ambulation, and chemoprophylaxis to prevent DVT.SHE progressed well with PT,but needed one more day for safety in ambulating prior to safe discharge home.  Patient benefited maximally from hospital stay and there were no complications.    Recent vital signs: Patient Vitals for the past 24 hrs:  BP Temp  Temp src Pulse Resp SpO2  02/22/16 0400 (!) 182/80 98 F (36.7 C) Oral (!) 102 - 97 %  02/21/16 2100 (!) 114/50 98.6 F (37 C) Oral 89 - 97 %  02/21/16 1457 (!) 176/96 97.5 F (36.4 C) Oral (!) 104 17 100 %     Recent laboratory studies:  Recent Labs  02/21/16 0331 02/22/16 0425  WBC 11.5* 15.5*  HGB 11.4* 11.5*  HCT 33.7* 33.4*  PLT 395 391  NA 133*  --   K 3.4*  --   CL 91*  --   CO2 27  --   BUN 9  --   CREATININE 0.77  --   GLUCOSE 229*  --   CALCIUM 9.5  --      Discharge Medications:   Allergies as of 02/22/2016      Reactions   Food Swelling, Other (See Comments)   CUCUMBERS TONGUE SWELLING   Codeine Nausea Only   Methocarbamol Nausea Only   Tramadol Nausea Only   Patient states she tolerates      Medication List    STOP taking these medications   acetaminophen 500 MG tablet Commonly known as:  TYLENOL     TAKE these medications   albuterol 108 (90 Base) MCG/ACT inhaler Commonly known as:  PROVENTIL HFA;VENTOLIN HFA Inhale 1-2 puffs into the lungs every 6 (six) hours as needed for wheezing or shortness of breath.   amLODipine 10 MG tablet Commonly known as:  NORVASC Take 10 mg  by mouth daily.   aspirin EC 325 MG tablet Take 1 tablet (325 mg total) by mouth 2 (two) times daily after a meal. Take x 1 month post op to decrease risk of blood clots. What changed:  medication strength  how much to take  when to take this  additional instructions   docusate sodium 100 MG capsule Commonly known as:  COLACE Take 1 capsule (100 mg total) by mouth 2 (two) times daily.   EDARBYCLOR 40-25 MG Tabs Generic drug:  Azilsartan-Chlorthalidone Take 1 tablet by mouth daily.   ezetimibe 10 MG tablet Commonly known as:  ZETIA Take 10 mg by mouth daily.   fexofenadine-pseudoephedrine 60-120 MG 12 hr tablet Commonly known as:  ALLEGRA-D Take 1 tablet by mouth every 12 (twelve) hours.   fluticasone 50 MCG/ACT nasal spray Commonly known as:   FLONASE Place 1 spray into both nostrils daily.   metFORMIN 500 MG tablet Commonly known as:  GLUCOPHAGE Take 250 mg by mouth 2 (two) times daily.   oxyCODONE-acetaminophen 5-325 MG tablet Commonly known as:  PERCOCET/ROXICET Take 1-2 tablets by mouth every 4 (four) hours as needed for severe pain.   tiZANidine 2 MG tablet Commonly known as:  ZANAFLEX Take 1 tablet (2 mg total) by mouth every 8 (eight) hours as needed for muscle spasms.   traMADol 50 MG tablet Commonly known as:  ULTRAM Take 50 mg by mouth every 6 (six) hours as needed for pain.       Diagnostic Studies: Dg Chest 2 View  Result Date: 02/15/2016 CLINICAL DATA:  Preoperative examination prior to left knee replacement. History of hypertension, diabetes, hyperlipidemia, and asthma. Former smoker. EXAM: CHEST  2 VIEW COMPARISON:  PA and lateral chest x-ray of October 30, 2013 FINDINGS: The lungs are adequately inflated. The interstitial markings are coarse bilaterally. There is linear density at the lung bases compatible with subsegmental atelectasis or scarring. There is no alveolar pneumonia. There is no pleural effusion. The heart and pulmonary vascularity are normal. The mediastinum is normal in width. There is mild multilevel degenerative disc disease of the thoracic spine. IMPRESSION: Chronic bronchitic changes slightly more conspicuous than on the previous study. No pneumonia, CHF, nor other acute cardiopulmonary abnormality. Electronically Signed   By: David  [place] M.D.   On: 02/15/2016 15:48    Disposition: 01-Home or Self Care  Discharge Instructions    CPM    Complete by:  As directed    Continuous passive motion machine (CPM):      Use the CPM from 0 to 70 for 8 hours per day.      You may increase by 5-10 per day.  You may break it up into 2 or 3 sessions per day.      Use CPM for 1-2 weeks or until you are told to stop.   Call MD / Call 911    Complete by:  As directed    If you experience chest pain  or shortness of breath, CALL 911 and be transported to the hospital emergency room.  If you develope a fever above 101 F, pus (white drainage) or increased drainage or redness at the wound, or calf pain, call your surgeon's office.   Constipation Prevention    Complete by:  As directed    Drink plenty of fluids.  Prune juice may be helpful.  You may use a stool softener, such as Colace (over the counter) 100 mg twice a day.  Use MiraLax (over the counter)  for constipation as needed.   Diet Carb Modified    Complete by:  As directed    Do not put a pillow under the knee. Place it under the heel.    Complete by:  As directed    Increase activity slowly as tolerated    Complete by:  As directed    Weight bearing as tolerated    Complete by:  As directed    Laterality:  left   Extremity:  Lower      Follow-up Information    GRAVES,JOHN L, MD. Schedule an appointment as soon as possible for a visit in 2 week(s).   Specialty:  Orthopedic Surgery Contact information: [address] [place] [address] [phone]        [facility] Follow up.   Specialty:  Home Health Services Why:  Someone from [facility] will contact you to arrange start date and time for therapy. Contact information: [address] [place] [address] [phone]            Signed: Matthew Folks 02/22/2016, 7:58 AM

## 2016-02-22 NOTE — Progress Notes (Signed)
Orthopedic Tech Progress Note Patient Details:  Isabella BourgeoisZenobia F Oliver March 12, 1950 [id number]  Ortho Devices Type of Ortho Device:  (FOOTSIE ROLL) Ortho Device/Splint Location: LLE Ortho Device/Splint Interventions: Application   Kele Withem 02/22/2016, 12:58 PM

## 2016-02-22 NOTE — Progress Notes (Signed)
Orthopedic Tech Progress Note Patient Details:  Isabella BourgeoisZenobia F Oliver Apr 12, 1950 [id number]  Ortho Devices Type of Ortho Device:  (footsie roll) Ortho Device/Splint Location: lle Ortho Device/Splint Interventions: Application   Nikki DomCrawford, Ashe Graybeal 02/22/2016, 12:57 PM As ordered by Dr. Luiz BlareGraves according to RN

## 2016-02-22 NOTE — Progress Notes (Signed)
Physical Therapy Treatment Patient Details Name: Isabella Oliver MRN: [id number] DOB: 02-24-1950 Today's Date: 02/22/2016    History of Present Illness Admitted for LTKA, WBAT, KI OOB;  has a past medical history of Allergy; Arthritis; Asthma; Diabetes mellitus without complication (HCC); Heart murmur; and Hypertension.  has a past surgical history that includes Hand surgery.    PT Comments    Making very good progress with progressive amb and overall activity tolerance; still with decr flexion, limited by pain; OK for dc home from PT standpoint   Follow Up Recommendations  Home health PT     Equipment Recommendations  3in1 (PT)    Recommendations for Other Services       Precautions / Restrictions Precautions Precautions: Knee Precaution Booklet Issued: Yes (comment) Precaution Comments: Pt educated to not allow any pillow or bolster under knee for healing with optimal range of motion.  Required Braces or Orthoses: Knee Immobilizer - Left Knee Immobilizer - Left: On when out of bed or walking (Nice, stable knee in stance) Restrictions LLE Weight Bearing: Weight bearing as tolerated    Mobility  Bed Mobility Overal bed mobility: Needs Assistance Bed Mobility: Supine to Sit     Supine to sit: Supervision     General bed mobility comments: Used non-operative LE to assist LLE off of bed  Transfers Overall transfer level: Needs assistance Equipment used: Rolling walker (2 wheeled) Transfers: Sit to/from Stand Sit to Stand: Min guard         General transfer comment: Cues to allow for knee flexion during transition  Ambulation/Gait Ambulation/Gait assistance: Min guard;Supervision Ambulation Distance (Feet): 250 Feet Assistive device: Rolling walker (2 wheeled) Gait Pattern/deviations: Step-through pattern     General Gait Details: Cues to activate L quad for stance stabiltiy; no buckling noted   Stairs Stairs: Yes   Stair Management: No rails;Step to  pattern;Backwards;With walker Number of Stairs: 2 General stair comments: Cues for technqiue and sequence; managing well; gave handout with inctructions in case daughter is unable to practice  Wheelchair Mobility    Modified Rankin (Stroke Patients Only)       Balance                                    Cognition Arousal/Alertness: Awake/alert Behavior During Therapy: WFL for tasks assessed/performed Overall Cognitive Status: Within Functional Limits for tasks assessed                      Exercises Total Joint Exercises Quad Sets: AROM;Left;10 reps Short Arc QuadBarbaraann Boys;Left;10 reps Heel Slides: AAROM;Left;10 reps Straight Leg Raises: AAROM;Left;10 reps Goniometric ROM: approx 5 - 65    General Comments        Pertinent Vitals/Pain Pain Assessment: 0-10 Pain Score: 8  Pain Location: L knee post assessment and ambulation Pain Descriptors / Indicators: Aching;Grimacing;Guarding Pain Intervention(s): Limited activity within patient's tolerance;Monitored during session;Premedicated before session    Home Living                      Prior Function            PT Goals (current goals can now be found in the care plan section) Acute Rehab PT Goals Patient Stated Goal: back to shopping (she likes [name]) PT Goal Formulation: With patient Progress towards PT goals: Progressing toward goals    Frequency    7X/week  PT Plan Current plan remains appropriate    Co-evaluation             End of Session Equipment Utilized During Treatment: Gait belt Activity Tolerance: Patient tolerated treatment well Patient left: in chair;with call bell/phone within reach     Time: 0951-1021 PT Time Calculation (min) (ACUTE ONLY): 30 min  Charges:  $Gait Training: 8-22 mins $Therapeutic Exercise: 8-22 mins                    G Codes:      Levi AlandHolly H Zeno Hickel 02/22/2016, 12:45 PM  Van ClinesHolly Tylin Stradley, PT  Acute Rehabilitation  Services Pager [phone] Office [phone]

## 2016-02-22 NOTE — Progress Notes (Signed)
Subjective: 2 Days Post-Op Procedure(s) (LRB): TOTAL KNEE ARTHROPLASTY (Left) Patient reports pain as mild.    Objective: Vital signs in last 24 hours: Temp:  [97.5 F (36.4 C)-98.6 F (37 C)] 98 F (36.7 C) (02/14 0400) Pulse Rate:  [89-104] 102 (02/14 0400) Resp:  [17] 17 (02/13 1457) BP: (114-182)/(50-96) 182/80 (02/14 0400) SpO2:  [97 %-100 %] 97 % (02/14 0400)  Intake/Output from previous day: 02/13 0701 - 02/14 0700 In: 716 [P.O.:716] Out: -  Intake/Output this shift: No intake/output data recorded.   Recent Labs  02/21/16 0331 02/22/16 0425  HGB 11.4* 11.5*    Recent Labs  02/21/16 0331 02/22/16 0425  WBC 11.5* 15.5*  RBC 3.86* 3.90  HCT 33.7* 33.4*  PLT 395 391    Recent Labs  02/21/16 0331  NA 133*  K 3.4*  CL 91*  CO2 27  BUN 9  CREATININE 0.77  GLUCOSE 229*  CALCIUM 9.5   No results for input(s): LABPT, INR in the last 72 hours.  Neurologically intact Sensation intact distally Intact pulses distally No cellulitis present Compartment soft  Assessment/Plan: 2 Days Post-Op Procedure(s) (LRB): TOTAL KNEE ARTHROPLASTY (Left) Advance diet Up with therapy Discharge home with home health  Isabella Oliver 02/22/2016, 7:18 AM

## 2016-08-11 ENCOUNTER — Encounter: Payer: Self-pay | Admitting: Family Medicine

## 2016-08-11 ENCOUNTER — Ambulatory Visit (INDEPENDENT_AMBULATORY_CARE_PROVIDER_SITE_OTHER): Payer: Medicare Other | Admitting: Family Medicine

## 2016-08-11 VITALS — BP 153/68 | HR 91 | Temp 99.5°F | Resp 18 | Ht 63.0 in | Wt 203.2 lb

## 2016-08-11 DIAGNOSIS — I10 Essential (primary) hypertension: Secondary | ICD-10-CM | POA: Diagnosis not present

## 2016-08-11 DIAGNOSIS — J069 Acute upper respiratory infection, unspecified: Secondary | ICD-10-CM

## 2016-08-11 DIAGNOSIS — Z87891 Personal history of nicotine dependence: Secondary | ICD-10-CM

## 2016-08-11 DIAGNOSIS — J4521 Mild intermittent asthma with (acute) exacerbation: Secondary | ICD-10-CM | POA: Diagnosis not present

## 2016-08-11 MED ORDER — AZITHROMYCIN 250 MG PO TABS: ORAL_TABLET | ORAL | 0 refills | Status: DC

## 2016-08-11 MED ORDER — ALBUTEROL SULFATE (2.5 MG/3ML) 0.083% IN NEBU
2.5000 mg | INHALATION_SOLUTION | Freq: Once | RESPIRATORY_TRACT | Status: AC
  Administered 2016-08-11: 2.5 mg via RESPIRATORY_TRACT

## 2016-08-11 MED ORDER — IPRATROPIUM BROMIDE 0.02 % IN SOLN
0.5000 mg | Freq: Once | RESPIRATORY_TRACT | Status: AC
  Administered 2016-08-11: 0.5 mg via RESPIRATORY_TRACT

## 2016-08-11 MED ORDER — BENZONATATE 200 MG PO CAPS: 200.0000 mg | ORAL_CAPSULE | Freq: Three times a day (TID) | ORAL | 0 refills | Status: DC | PRN

## 2016-08-11 MED ORDER — PREDNISONE 20 MG PO TABS: 40.0000 mg | ORAL_TABLET | Freq: Every day | ORAL | 0 refills | Status: DC

## 2016-08-11 NOTE — Patient Instructions (Addendum)
Use your inhaler 4 times a day whether you hear yourself wheezing or not.   IF you received an x-ray today, you will receive an invoice from [facility]. Please contact [facility] at [phone] with questions or concerns regarding your invoice.   IF you received labwork today, you will receive an invoice from [facility]. Please contact [facility] at [phone] with questions or concerns regarding your invoice.   Our billing staff will not be able to assist you with questions regarding bills from these companies.  You will be contacted with the lab results as soon as they are available. The fastest way to get your results is to activate your My Chart account. Instructions are located on the last page of this paperwork. If you have not heard from us regarding the results in 2 weeks, please contact this office.     Asthma, Adult Asthma is a recurring condition in which the airways tighten and narrow. Asthma can make it difficult to breathe. It can cause coughing, wheezing, and shortness of breath. Asthma episodes, also called asthma attacks, range from minor to life-threatening. Asthma cannot be cured, but medicines and lifestyle changes can help control it. What are the causes? Asthma is believed to be caused by inherited (genetic) and environmental factors, but its exact cause is unknown. Asthma may be triggered by allergens, lung infections, or irritants in the air. Asthma triggers are different for each person. Common triggers include:  Animal dander.  Dust mites.  Cockroaches.  Pollen from trees or grass.  Mold.  Smoke.  Air pollutants such as dust, household cleaners, hair sprays, aerosol sprays, paint fumes, strong chemicals, or strong odors.  Cold air, weather changes, and winds (which increase molds and pollens in the air).  Strong emotional expressions such as crying or laughing hard.  Stress.  Certain medicines (such as aspirin) or types of drugs (such  as beta-blockers).  Sulfites in foods and drinks. Foods and drinks that may contain sulfites include dried fruit, potato chips, and sparkling grape juice.  Infections or inflammatory conditions such as the flu, a cold, or an inflammation of the nasal membranes (rhinitis).  Gastroesophageal reflux disease (GERD).  Exercise or strenuous activity.  What are the signs or symptoms? Symptoms may occur immediately after asthma is triggered or many hours later. Symptoms include:  Wheezing.  Excessive nighttime or early morning coughing.  Frequent or severe coughing with a common cold.  Chest tightness.  Shortness of breath.  How is this diagnosed? The diagnosis of asthma is made by a review of your medical history and a physical exam. Tests may also be performed. These may include:  Lung function studies. These tests show how much air you breathe in and out.  Allergy tests.  Imaging tests such as X-rays.  How is this treated? Asthma cannot be cured, but it can usually be controlled. Treatment involves identifying and avoiding your asthma triggers. It also involves medicines. There are 2 classes of medicine used for asthma treatment:  Controller medicines. These prevent asthma symptoms from occurring. They are usually taken every day.  Reliever or rescue medicines. These quickly relieve asthma symptoms. They are used as needed and provide short-term relief.  Your health care provider will help you create an asthma action plan. An asthma action plan is a written plan for managing and treating your asthma attacks. It includes a list of your asthma triggers and how they may be avoided. It also includes information on when medicines should be taken and  when their dosage should be changed. An action plan may also involve the use of a device called a peak flow meter. A peak flow meter measures how well the lungs are working. It helps you monitor your condition. Follow these instructions at  home:  Take medicines only as directed by your health care provider. Speak with your health care provider if you have questions about how or when to take the medicines.  Use a peak flow meter as directed by your health care provider. Record and keep track of readings.  Understand and use the action plan to help minimize or stop an asthma attack without needing to seek medical care.  Control your home environment in the following ways to help prevent asthma attacks: ? Do not smoke. Avoid being exposed to secondhand smoke. ? Change your heating and air conditioning filter regularly. ? Limit your use of fireplaces and wood stoves. ? Get rid of pests (such as roaches and mice) and their droppings. ? Throw away plants if you see mold on them. ? Clean your floors and dust regularly. Use unscented cleaning products. ? Try to have someone else vacuum for you regularly. Stay out of rooms while they are being vacuumed and for a short while afterward. If you vacuum, use a dust mask from a hardware store, a double-layered or microfilter vacuum cleaner bag, or a vacuum cleaner with a HEPA filter. ? Replace carpet with wood, tile, or vinyl flooring. Carpet can trap dander and dust. ? Use allergy-proof pillows, mattress covers, and box spring covers. ? Wash bed sheets and blankets every week in hot water and dry them in a dryer. ? Use blankets that are made of polyester or cotton. ? Clean bathrooms and kitchens with bleach. If possible, have someone repaint the walls in these rooms with mold-resistant paint. Keep out of the rooms that are being cleaned and painted. ? Wash hands frequently. Contact a health care provider if:  You have wheezing, shortness of breath, or a cough even if taking medicine to prevent attacks.  The colored mucus you cough up (sputum) is thicker than usual.  Your sputum changes from clear or white to yellow, green, gray, or bloody.  You have any problems that may be related to  the medicines you are taking (such as a rash, itching, swelling, or trouble breathing).  You are using a reliever medicine more than 2-3 times per week.  Your peak flow is still at 50-79% of your personal best after following your action plan for 1 hour.  You have a fever. Get help right away if:  You seem to be getting worse and are unresponsive to treatment during an asthma attack.  You are short of breath even at rest.  You get short of breath when doing very little physical activity.  You have difficulty eating, drinking, or talking due to asthma symptoms.  You develop chest pain.  You develop a fast heartbeat.  You have a bluish color to your lips or fingernails.  You are light-headed, dizzy, or faint.  Your peak flow is less than 50% of your personal best. This information is not intended to replace advice given to you by your health care provider. Make sure you discuss any questions you have with your health care provider. Document Released: 12/25/2004 Document Revised: 06/08/2015 Document Reviewed: 07/24/2012 Elsevier Interactive Patient Education  2017 [employer]

## 2016-08-11 NOTE — Progress Notes (Addendum)
By signing my name below, I, Mesha Guinyard, attest that this documentation has been prepared under the direction and in the presence of Norberto SorensonEva Baeleigh Devincent, MD.  Electronically Signed: Arvilla MarketMesha Guinyard, Medical Scribe. 08/11/16. 4:30 PM.  Subjective:    Patient ID: Isabella Oliver, female    DOB: 1950/09/13, 66 y.o.   MRN: [id number]  HPI Chief Complaint  Patient presents with  . URI    productive cough sometimes yellow sputum since Thursday afternoon     HPI Comments: Isabella Oliver is a 66 y.o. female who [facility] complaining of productive cough with yellow sputum onset 2 days ago. She reports her sxs worsen in the afternoon. Reports associated sxs of nasal congestion mainly at night, sore throat, ear pain, SOB, wheezing onset today, sleep disturbance, fevers, eye "weakness" described as "eyes feeling wired", fatigue, aches, and chills. She's used tylenol, and halls cough drops for without relief of her sxs. Pt hasn't been sick like this in the past months. Pt smoked 18 years ago and she uses an inhaler. She used her inhaler yestedray for relief of her sxs, but not today. Her blood sugar has been better than they have been before- fasting blood sugar 153-165 in the morning. Pt is always sweaty but she hasn't noticed anything new diaphoresis. Denies new diaphoresis, sinus pressure or pain.  Pt has not been on abx for the past month and she doesn't have any allergies.  Patient Active Problem List   Diagnosis Date Noted  . Primary osteoarthritis of left knee 02/20/2016  . Arthritis 10/30/2013  . Type 2 diabetes mellitus without complication (HCC) 10/30/2013  . Essential hypertension 10/30/2013  . Hyperlipidemia 10/30/2013   Past Medical History:  Diagnosis Date  . Allergy   . Arthritis   . Asthma   . Diabetes mellitus without complication (HCC)   . Heart murmur    [facility]  . Hypertension    Past Surgical History:  Procedure  Laterality Date  . HAND SURGERY    . 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 TUBAL LIGATION    . TUBAL LIGATION     Allergies  Allergen Reactions  . Food Swelling and Other (See Comments)    CUCUMBERS TONGUE SWELLING  . Codeine Nausea Only  . Methocarbamol Nausea Only  . Tramadol Nausea Only    Patient states she tolerates   Prior to Admission medications   Medication Sig Start Date End Date Taking? Authorizing Provider  albuterol (PROVENTIL HFA;VENTOLIN HFA) 108 (90 Base) MCG/ACT inhaler Inhale 1-2 puffs into the lungs every 6 (six) hours as needed for wheezing or shortness of breath.   Yes [provider]  amLODipine (NORVASC) 10 MG tablet Take 10 mg by mouth daily.   Yes [provider]  docusate sodium (COLACE) 100 MG capsule Take 1 capsule (100 mg total) by mouth 2 (two) times daily. 02/20/16  Yes Marshia LyBethune, James, PA-C  ezetimibe (ZETIA) 10 MG tablet Take 10 mg by mouth daily.   Yes [provider]  fluticasone (FLONASE) 50 MCG/ACT nasal spray Place 1 spray into both nostrils daily. 10/16/15  Yes Naida Sleightwens, James M, PA-C  metFORMIN (GLUCOPHAGE) 500 MG tablet Take 250 mg by mouth 2 (two) times daily.   Yes [provider]   Social History   Social History  . Marital  status: Divorced    Spouse name: N/A  . Number of children: N/A  . Years of education: N/A   Occupational History  . Not on file.   Social History Main Topics  . Smoking status: Former Games developer  . Smokeless tobacco: Never Used  . Alcohol use No  . Drug use: No  . Sexual activity: Not on file   Other Topics Concern  . Not on file   Social History Narrative  . No narrative on file   Review of Systems  Constitutional: Positive for chills, fatigue and fever. Negative for diaphoresis.  HENT: Positive for congestion, ear pain and sore throat. Negative  for sinus pain and sinus pressure.   Respiratory: Positive for shortness of breath and wheezing.   Musculoskeletal: Positive for myalgias.  Psychiatric/Behavioral: Positive for sleep disturbance.   Objective:  Physical Exam  Constitutional: She appears well-developed and well-nourished. No distress.  HENT:  Head: Normocephalic and atraumatic.  Mouth/Throat: Oropharynx is clear and moist.  Nasal mucosal erythema  Eyes: Conjunctivae are normal.  Neck: Neck supple. Thyromegaly (questionable left) present.  Cardiovascular: Normal rate and regular rhythm.  Exam reveals no gallop and no friction rub.   Murmur heard.  Systolic (pan) murmur is present with a grade of 3/6  Pulmonary/Chest: Effort normal. No respiratory distress. She has decreased breath sounds (severe). She has wheezes (bibasilar expiratory). She has no rhonchi. She has no rales.  Lymphadenopathy:       Head (right side): Submandibular adenopathy present.       Head (left side): Submandibular adenopathy present.    She has cervical adenopathy.       Right cervical: Superficial cervical adenopathy present. No posterior cervical adenopathy present.      Left cervical: Superficial cervical adenopathy present. No posterior cervical adenopathy present.       Right: No supraclavicular adenopathy present.       Left: No supraclavicular adenopathy present.  Intracervical adenopathy  Neurological: She is alert.  Skin: Skin is warm and dry.  Psychiatric: She has a normal mood and affect. Her behavior is normal.  Nursing note and vitals reviewed.   Sig improvement in sxs and exam after duoneb in office.  Vitals:   08/11/16 1553  BP: (!) 162/65  Pulse: 91  Resp: 18  Temp: 99.5 F (37.5 C)  TempSrc: Oral  SpO2: 95%  Weight: 203 lb 3.2 oz (92.2 kg)  Height: 5\' 3"  (1.6 m)   Body mass index is 36 kg/m. Assessment & Plan:   1. Essential hypertension   2. Mild intermittent asthma with acute exacerbation - use alb sched qid.    3. History of tobacco use   4. Acute upper respiratory infection     Meds ordered this encounter  Medications  . albuterol (PROVENTIL) (2.5 MG/3ML) 0.083% nebulizer solution 2.5 mg  . ipratropium (ATROVENT) nebulizer solution 0.5 mg  . predniSONE (DELTASONE) 20 MG tablet    Sig: Take 2 tablets (40 mg total) by mouth daily with breakfast.    Dispense:  10 tablet    Refill:  0  . azithromycin (ZITHROMAX) 250 MG tablet    Sig: Take 2 tabs PO x 1 dose, then 1 tab PO QD x 4 days    Dispense:  6 tablet    Refill:  0  . benzonatate (TESSALON) 200 MG capsule    Sig: Take 1 capsule (200 mg total) by mouth 3 (three) times daily as needed for cough.    Dispense:  30 capsule    Refill:  0    I personally performed the services described in this documentation, which was scribed in my presence. The recorded information has been reviewed and considered, and addended by me as needed.   Norberto SorensonEva Brittney Caraway, M.D.  [facility] at [facility]  [place] [address] [place], [place] [address] [phone] phone [phone] fax  08/12/16 9:23 PM

## 2017-06-04 ENCOUNTER — Other Ambulatory Visit: Payer: Self-pay | Admitting: Internal Medicine

## 2017-06-04 DIAGNOSIS — Z1231 Encounter for screening mammogram for malignant neoplasm of breast: Secondary | ICD-10-CM

## 2017-06-11 ENCOUNTER — Other Ambulatory Visit (HOSPITAL_COMMUNITY): Payer: Self-pay | Admitting: Internal Medicine

## 2017-06-11 DIAGNOSIS — R011 Cardiac murmur, unspecified: Secondary | ICD-10-CM

## 2017-06-12 ENCOUNTER — Other Ambulatory Visit: Payer: Self-pay | Admitting: Internal Medicine

## 2017-06-12 DIAGNOSIS — R1084 Generalized abdominal pain: Secondary | ICD-10-CM

## 2017-06-19 ENCOUNTER — Other Ambulatory Visit: Payer: Self-pay

## 2017-06-19 ENCOUNTER — Ambulatory Visit (HOSPITAL_COMMUNITY): Payer: Medicare Other | Attending: Cardiology

## 2017-06-19 DIAGNOSIS — E669 Obesity, unspecified: Secondary | ICD-10-CM | POA: Insufficient documentation

## 2017-06-19 DIAGNOSIS — R011 Cardiac murmur, unspecified: Secondary | ICD-10-CM | POA: Diagnosis present

## 2017-06-19 DIAGNOSIS — E119 Type 2 diabetes mellitus without complications: Secondary | ICD-10-CM | POA: Insufficient documentation

## 2017-06-19 DIAGNOSIS — I350 Nonrheumatic aortic (valve) stenosis: Secondary | ICD-10-CM | POA: Diagnosis not present

## 2017-06-19 DIAGNOSIS — I119 Hypertensive heart disease without heart failure: Secondary | ICD-10-CM | POA: Insufficient documentation

## 2017-06-19 DIAGNOSIS — E785 Hyperlipidemia, unspecified: Secondary | ICD-10-CM | POA: Diagnosis not present

## 2017-06-19 DIAGNOSIS — I071 Rheumatic tricuspid insufficiency: Secondary | ICD-10-CM | POA: Diagnosis not present

## 2017-06-21 ENCOUNTER — Ambulatory Visit
Admission: RE | Admit: 2017-06-21 | Discharge: 2017-06-21 | Disposition: A | Discharge status: Home / Self Care | Payer: Medicare Other | Source: Ambulatory Visit | Attending: Internal Medicine | Admitting: Internal Medicine

## 2017-06-21 DIAGNOSIS — R1084 Generalized abdominal pain: Secondary | ICD-10-CM

## 2017-06-21 MED ORDER — IOHEXOL 300 MG/ML  SOLN
125.0000 mL | Freq: Once | INTRAMUSCULAR | Status: AC | PRN
  Administered 2017-06-21: 125 mL via INTRAVENOUS

## 2017-06-25 ENCOUNTER — Ambulatory Visit
Admission: RE | Admit: 2017-06-25 | Discharge: 2017-06-25 | Disposition: A | Discharge status: Home / Self Care | Payer: Medicare Other | Source: Ambulatory Visit | Attending: Internal Medicine | Admitting: Internal Medicine

## 2017-06-25 DIAGNOSIS — Z1231 Encounter for screening mammogram for malignant neoplasm of breast: Secondary | ICD-10-CM

## 2018-07-08 ENCOUNTER — Other Ambulatory Visit: Payer: Self-pay | Admitting: Orthopedic Surgery

## 2018-07-16 ENCOUNTER — Encounter: Payer: Self-pay | Admitting: Cardiology

## 2018-07-17 ENCOUNTER — Ambulatory Visit (INDEPENDENT_AMBULATORY_CARE_PROVIDER_SITE_OTHER): Payer: Medicare Other | Admitting: Cardiology

## 2018-07-17 ENCOUNTER — Other Ambulatory Visit: Payer: Self-pay

## 2018-07-17 ENCOUNTER — Encounter: Payer: Self-pay | Admitting: Cardiology

## 2018-07-17 VITALS — BP 128/66 | HR 76 | Temp 97.7°F | Ht 63.0 in | Wt 195.0 lb

## 2018-07-17 DIAGNOSIS — Z01810 Encounter for preprocedural cardiovascular examination: Secondary | ICD-10-CM | POA: Diagnosis not present

## 2018-07-17 DIAGNOSIS — E785 Hyperlipidemia, unspecified: Secondary | ICD-10-CM

## 2018-07-17 DIAGNOSIS — I350 Nonrheumatic aortic (valve) stenosis: Secondary | ICD-10-CM | POA: Insufficient documentation

## 2018-07-17 NOTE — Progress Notes (Signed)
Patient referred by Kirby FunkGriffin, John, MD for surgical clearance for Knee arthroplasty  Subjective:   Isabella Oliver, female    DOB: [birth year], 68 y.o.   MRN: [id number]   Chief Complaint  Patient presents with  . Hypertension  . Hyperlipidemia  . New Patient (Initial Visit)  . Surgical clearance    knee replacement     HPI  68 y.o. AA female with hypertension, hyperlipidemia, type 2 DM, mild aortic stenosis.  She presents today for evaluation and cardiac clearance for an upcoming knee arthroplasty procedure.  She has been doing well overall she denies any chest pain, palpitations, orthopnea or shortness of breath.  Her mobility is limited by knee pain but does not feel short of breath with ambulation.     Past Medical History:  Diagnosis Date  . Allergy   . Arthritis   . Asthma   . Diabetes mellitus without complication (HCC)   . Heart murmur    [facility]  . Hypertension      Past Surgical History:  Procedure Laterality Date  . HAND SURGERY    . 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 TUBAL LIGATION    . TUBAL LIGATION       Social History   Socioeconomic History  . Marital status: Divorced    Spouse name: Not on file  . Number of children: 2  . Years of education: Not on file  . Highest education level: Not on file  Occupational History  . Not on file  Social Needs  . Financial resource strain: Not on file  . Food insecurity    Worry: Not on file    Inability: Not on file  . Transportation needs    Medical: Not on file    Non-medical: Not on file  Tobacco Use  . Smoking status: Former Smoker    Packs/day: 0.50    Years: 20.00    Pack years: 10.00    Types: Cigarettes    Quit date: 2001    Years since quitting: 19.5  . Smokeless tobacco: Never Used  Substance and Sexual Activity  . Alcohol  use: Yes    Comment: seldom  . Drug use: No  . Sexual activity: Not on file  Lifestyle  . Physical activity    Days per week: Not on file    Minutes per session: Not on file  . Stress: Not on file  Relationships  . Social Musicianconnections    Talks on phone: Not on file    Gets together: Not on file    Attends religious service: Not on file    Active member of club or organization: Not on file    Attends meetings of clubs or organizations: Not on file    Relationship status: Not on file  . Intimate partner violence    Fear of current or ex partner: Not on file    Emotionally abused: Not on file    Physically abused: Not on file    Forced sexual activity: Not on file  Other Topics Concern  . Not on file  Social History Narrative  . Not on file     Family History  Problem Relation Age of Onset  . Lung disease Mother   . Hypertension Father   . Cancer Sister   . Diabetes Sister   .  Cancer Maternal Grandmother   . Cancer Maternal Grandfather      Current Outpatient Medications on File Prior to Visit  Medication Sig Dispense Refill  . aspirin EC 81 MG tablet Take 81 mg by mouth daily.    . chlorthalidone (HYGROTON) 25 MG tablet Take 25 mg by mouth daily.    . Cholecalciferol (VITAMIN D3) 50 MCG (2000 UT) TABS Take by mouth daily.    . empagliflozin (JARDIANCE) 25 MG TABS tablet Take 25 mg by mouth daily.    Marland Kitchen. ezetimibe (ZETIA) 10 MG tablet Take 10 mg by mouth daily.    Marland Kitchen. glimepiride (AMARYL) 4 MG tablet Take 4 mg by mouth. 1/2 tab daily    . losartan (COZAAR) 100 MG tablet Take 100 mg by mouth daily.    . pantoprazole (PROTONIX) 40 MG tablet Take 40 mg by mouth daily.    . potassium chloride (MICRO-K) 10 MEQ CR capsule Take 10 mEq by mouth daily.    Marland Kitchen. albuterol (PROVENTIL HFA;VENTOLIN HFA) 108 (90 Base) MCG/ACT inhaler Inhale 1-2 puffs into the lungs every 6 (six) hours as needed for wheezing or shortness of breath.    Marland Kitchen. amLODipine (NORVASC) 10 MG tablet Take 10 mg by mouth  daily.     No current facility-administered medications on file prior to visit.     Cardiovascular studies: EKG 07/17/2018:  Sinus rhythm 75 bpm.  Right bundle branch block  Left anterior fascicular block.  Poor R wave progression  Lead loss V6   Echocardiogram 06/2017:  - Left ventricle: The cavity size was normal. Systolic function was   vigorous. The estimated ejection fraction was in the range of 65%   to 70%. Wall motion was normal; there were no regional wall   motion abnormalities. Doppler parameters are consistent with   abnormal left ventricular relaxation (grade 1 diastolic   dysfunction). Doppler parameters are consistent with elevated   ventricular end-diastolic filling pressure. - Aortic valve: Transvalvular velocity was minimally increased.   There was mild stenosis. Mean gradient (S): 9 mm Hg. Peak   gradient (S): 17 mm Hg. - Mitral valve: There was trivial regurgitation. - Tricuspid valve: There was mild regurgitation. - Pulmonary arteries: Systolic pressure was within the normal   range. - Inferior vena cava: The vessel was normal in size. - Pericardium, extracardiac: There was no pericardial effusion.  Impressions:  - Aortic valve is poorly visualized, unable to determine number of   leaflets of this study. There is mild aortic stenosis.   Recent labs: 07/09/2018  CMP Cr 1.05, GFR 63, electrolytes wnl  CBC Hgb 11.7, MCV 88.4, WBC 5.7, PLT 286  Lipid Panel: Total cholesterol 208, Trig 94, HDL 54, LDL 136   ROS       Vitals:   07/17/18 1054  BP: 128/66  Pulse: 76  Temp: 97.7 F (36.5 C)     Body mass index is 34.54 kg/m. Filed Weights   07/17/18 1054  Weight: 195 lb (88.5 kg)     Objective:   Physical Exam  Constitutional: She is oriented to person, place, and time. She appears well-developed and well-nourished. No distress.  HENT:  Head: Normocephalic and atraumatic.  Eyes: Pupils are equal, round, and reactive to light.  Conjunctivae are normal.  Neck: No JVD present.  Cardiovascular: Normal rate, regular rhythm and intact distal pulses.  Murmur heard.  Early systolic murmur is present with a grade of 1/6. Pulmonary/Chest: Effort normal and breath sounds normal. She has no wheezes.  She has no rales.  Abdominal: Soft. Bowel sounds are normal. There is no rebound.  Musculoskeletal:        General: No edema.  Lymphadenopathy:    She has no cervical adenopathy.  Neurological: She is alert and oriented to person, place, and time. No cranial nerve deficit.  Skin: Skin is warm and dry.  Psychiatric: She has a normal mood and affect.  Nursing note and vitals reviewed.         Assessment & Recommendations:   68 y.o. AA female with hypertension, hyperlipidemia, type 2 DM, mild aortic stenosis  Pre-op risk stratification:  Baseline functional capacity without any angina or angina equivalent symptoms.  No indication for ischemic evaluation at this time.  Given her baseline EKG abnormalities and known mild aortic stenosis, recommend echocardiogram.  Acceptable cardiac risk for long cardiac surgery of knee replacement.  In absence of bleeding, reasonable to continue aspirin 81 mg daily.  Lipid panel is suboptimal, but she has been statin intolerant and not interested in trying PCSK9 inhibitors.  Continue Zetia.  Thank you for referring the patient to [place]. Please feel free to contact with any questions.  Nigel Mormon, MD [facility] Pager: [phone] Office: [phone] If no answer Cell [phone]

## 2018-08-15 ENCOUNTER — Ambulatory Visit (INDEPENDENT_AMBULATORY_CARE_PROVIDER_SITE_OTHER): Payer: Medicare Other

## 2018-08-15 ENCOUNTER — Other Ambulatory Visit: Payer: Self-pay

## 2018-08-15 DIAGNOSIS — Z01810 Encounter for preprocedural cardiovascular examination: Secondary | ICD-10-CM | POA: Diagnosis not present

## 2018-08-21 NOTE — Progress Notes (Signed)
CARDAIC CLEARANCE NOTE DR Virgina Jock 07-17-18 Epic ECHO 8--7-20 Epic (NOTE ATTACHED TO ECHO ALSO GIVES CARDIAC CLEARANCE DR PATWARDHAM) EKG 07-17-18 EPIC

## 2018-08-21 NOTE — Patient Instructions (Addendum)
YOU NEED TO HAVE A COVID 19 TEST ON Tuesday, Aug. 18, 2020 @ 2:00PM, THIS TEST MUST BE DONE BEFORE SURGERY, [address]  [place], [place] , [address]. ONCE YOUR COVID TEST IS COMPLETED, PLEASE BEGIN THE QUARANTINE INSTRUCTIONS AS OUTLINED IN YOUR HANDOUT.                DALILAH CURLIN    Your procedure is scheduled on: 08-29-2018   Report to [facility] Main  Entrance    Report to  SHORT STAY at 530 AM    1 VISITOR IS ALLOWED TO WAIT IN WAITING ROOM  ONLY DAY OF YOUR SURGERY.  THEN MAY GO TO YOUR ROOM ONLY DURING VISITING HOURS   Call this number if you have problems the morning of surgery [phone]    Remember: BRUSH YOUR TEETH MORNING OF SURGERY AND RINSE YOUR MOUTH OUT, NO CHEWING GUM CANDY OR MINTS.   NO SOLID FOOD AFTER MIDNIGHT THE NIGHT PRIOR TO SURGERY.    NOTHING BY MOUTH EXCEPT CLEAR LIQUIDS UNTIL 415 AM  .    PLEASE FINISH  G2 DRINK PER SURGEON ORDER  WHICH NEEDS TO BE COMPLETED AT 415 AM  .   CLEAR LIQUID DIET   Foods Allowed                                                                     Foods Excluded  Water, Black, Coffee and tea, regular and decaf                             liquids that you cannot  Plain Jell-O any favor except red or purple                                           see through such as: Fruit ices (not with fruit pulp)                                     milk, soups, orange juice  Iced Popsicles                                    All solid food Carbonated beverages, regular and diet                                    Cranberry, grape and apple juices Sports drinks like Gatorade Lightly seasoned clear broth or consume(fat free) Sugar, honey syrup  Sample Menu Breakfast                                Lunch  Supper Cranberry juice                    Beef broth                            Chicken broth Jell-O                                     Grape juice                            Apple juice Coffee or tea                        Jell-O                                      Popsicle                                                Coffee or tea                        Coffee or tea  _____________________________________________________________________     Take these medicines the morning of surgery with A SIP OF WATER: Amlodipine, Ezetimibe, Pantoprazole  DO NOT TAKE ANY DIABETIC MEDICATIONS DAY OF YOUR SURGERY  How to Manage Your Diabetes Before and After Surgery  Why is it important to control my blood sugar before and after surgery? . Improving blood sugar levels before and after surgery helps healing and can limit problems. . A way of improving blood sugar control is eating a healthy diet by: o  Eating less sugar and carbohydrates o  Increasing activity/exercise o  Talking with your doctor about reaching your blood sugar goals . High blood sugars (greater than 180 mg/dL) can raise your risk of infections and slow your recovery, so you will need to focus on controlling your diabetes during the weeks before surgery. . Make sure that the doctor who takes care of your diabetes knows about your planned surgery including the date and location.  How do I manage my blood sugar before surgery? . Check your blood sugar at least 4 times a day, starting 2 days before surgery, to make sure that the level is not too high or low. o Check your blood sugar the morning of your surgery when you wake up and every 2 hours until you get to the Short Stay unit. . If your blood sugar is less than 70 mg/dL, you will need to treat for low blood sugar: o Do not take insulin. o Treat a low blood sugar (less than 70 mg/dL) with  cup of clear juice (cranberry or apple), 4 glucose tablets, OR glucose gel. o Recheck blood sugar in 15 minutes after treatment (to make sure it is greater than 70 mg/dL). If your blood sugar is not greater than 70 mg/dL on recheck, call [phone] for further  instructions. . Report your blood sugar to the short stay nurse when you get to Short Stay.  . If you are admitted to the hospital after  surgery: o Your blood sugar will be checked by the staff and you will probably be given insulin after surgery (instead of oral diabetes medicines) to make sure you have good blood sugar levels. o The goal for blood sugar control after surgery is 80-180 mg/dL.   WHAT DO I DO ABOUT MY DIABETES MEDICATION?  Marland Kitchen. Do not take oral diabetes medicines (pills) the morning of surgery.  . THE DAY BEFORE SURGERY DO NOT TAKE JARDIANCE. . THE MORNING OF SURGERY DO NOT TAKE JARDIANCE.  .  THE DAY BEFORE SURGERY TAKE MORNING DOSE OF GLIMPERIDE. .  THE DAY OF SURGERY DO NOT TAKE GLIMPERIDE.                             You may not have any metal on your body including hair pins and              piercings  Do not wear jewelry, make-up, lotions, powders or perfumes, deodorant             Do not wear nail polish.  Do not shave  48 hours prior to surgery.              Men may shave face and neck.   Do not bring valuables to the hospital. [facility] IS NOT             RESPONSIBLE   FOR VALUABLES.  Contacts, dentures or bridgework may not be worn into surgery.  Leave suitcase in the car. After surgery it may be brought to your room.     Patients discharged the day of surgery will not be allowed to drive home. IF YOU ARE HAVING SURGERY AND GOING HOME THE SAME DAY, YOU MUST HAVE AN ADULT TO DRIVE YOU HOME AND BE WITH YOU FOR 24 HOURS. YOU MAY GO HOME BY TAXI OR UBER OR ORTHERWISE, BUT AN ADULT MUST ACCOMPANY YOU HOME AND STAY WITH YOU FOR 24 HOURS.  Name and phone number of your driver:  Special Instructions: N/A              Please read over the following fact sheets you were given: _____________________________________________________________________             [facility] Before surgery, you can play an important role.  Because skin is not  sterile, your skin needs to be as free of germs as possible.  You can reduce the number of germs on your skin by washing with CHG (chlorahexidine gluconate) soap before surgery.  CHG is an antiseptic cleaner which kills germs and bonds with the skin to continue killing germs even after washing. Please DO NOT use if you have an allergy to CHG or antibacterial soaps.  If your skin becomes reddened/irritated stop using the CHG and inform your nurse when you arrive at Short Stay. Do not shave (including legs and underarms) for at least 48 hours prior to the first CHG shower.  You may shave your face/neck. Please follow these instructions carefully:  1.  Shower with CHG Soap the night before surgery and the  morning of Surgery.  2.  If you choose to wash your hair, wash your hair first as usual with your  normal  shampoo.  3.  After you shampoo, rinse your hair and body thoroughly to remove the  shampoo.  4.  Use CHG as you would any other liquid soap.  You can apply chg directly  to the skin and wash                       Gently with a scrungie or clean washcloth.  5.  Apply the CHG Soap to your body ONLY FROM THE NECK DOWN.   Do not use on face/ open                           Wound or open sores. Avoid contact with eyes, ears mouth and genitals (private parts).                       Wash face,  Genitals (private parts) with your normal soap.             6.  Wash thoroughly, paying special attention to the area where your surgery  will be performed.  7.  Thoroughly rinse your body with warm water from the neck down.  8.  DO NOT shower/wash with your normal soap after using and rinsing off  the CHG Soap.                9.  Pat yourself dry with a clean towel.            10.  Wear clean pajamas.            11.  Place clean sheets on your bed the night of your first shower and do not  sleep with pets. Day of Surgery : Do not apply any lotions/deodorants the morning of surgery.   Please wear clean clothes to the hospital/surgery center.  FAILURE TO FOLLOW THESE INSTRUCTIONS MAY RESULT IN THE CANCELLATION OF YOUR SURGERY PATIENT SIGNATURE_________________________________  NURSE SIGNATURE__________________________________  ________________________________________________________________________   Adam Phenix  An incentive spirometer is a tool that can help keep your lungs clear and active. This tool measures how well you are filling your lungs with each breath. Taking long deep breaths may help reverse or decrease the chance of developing breathing (pulmonary) problems (especially infection) following:  A long period of time when you are unable to move or be active. BEFORE THE PROCEDURE   If the spirometer includes an indicator to show your best effort, your nurse or respiratory therapist will set it to a desired goal.  If possible, sit up straight or lean slightly forward. Try not to slouch.  Hold the incentive spirometer in an upright position. INSTRUCTIONS FOR USE  1. Sit on the edge of your bed if possible, or sit up as far as you can in bed or on a chair. 2. Hold the incentive spirometer in an upright position. 3. Breathe out normally. 4. Place the mouthpiece in your mouth and seal your lips tightly around it. 5. Breathe in slowly and as deeply as possible, raising the piston or the ball toward the top of the column. 6. Hold your breath for 3-5 seconds or for as long as possible. Allow the piston or ball to fall to the bottom of the column. 7. Remove the mouthpiece from your mouth and breathe out normally. 8. Rest for a few seconds and repeat Steps 1 through 7 at least 10 times every 1-2 hours when you are awake. Take your time and take a few normal breaths between deep breaths. 9. The spirometer may include an indicator to show  your best effort. Use the indicator as a goal to work toward during each repetition. 10. After each set of 10 deep  breaths, practice coughing to be sure your lungs are clear. If you have an incision (the cut made at the time of surgery), support your incision when coughing by placing a pillow or rolled up towels firmly against it. Once you are able to get out of bed, walk around indoors and cough well. You may stop using the incentive spirometer when instructed by your caregiver.  RISKS AND COMPLICATIONS  Take your time so you do not get dizzy or light-headed.  If you are in pain, you may need to take or ask for pain medication before doing incentive spirometry. It is harder to take a deep breath if you are having pain. AFTER USE  Rest and breathe slowly and easily.  It can be helpful to keep track of a log of your progress. Your caregiver can provide you with a simple table to help with this. If you are using the spirometer at home, follow these instructions: [place] IF:   You are having difficultly using the spirometer.  You have trouble using the spirometer as often as instructed.  Your pain medication is not giving enough relief while using the spirometer.  You develop fever of 100.5 F (38.1 C) or higher. SEEK IMMEDIATE MEDICAL CARE IF:   You cough up bloody sputum that had not been present before.  You develop fever of 102 F (38.9 C) or greater.  You develop worsening pain at or near the incision site. MAKE SURE YOU:   Understand these instructions.  Will watch your condition.  Will get help right away if you are not doing well or get worse. Document Released: 05/07/2006 Document Revised: 03/19/2011 Document Reviewed: 07/08/2006 [facility] Patient Information 2014 [place], [place].   ________________________________________________________________________

## 2018-08-22 ENCOUNTER — Encounter (HOSPITAL_COMMUNITY)
Admission: RE | Admit: 2018-08-22 | Discharge: 2018-08-22 | Disposition: A | Discharge status: Home / Self Care | Payer: Medicare Other | Source: Ambulatory Visit | Attending: Orthopedic Surgery | Admitting: Orthopedic Surgery

## 2018-08-22 ENCOUNTER — Encounter (HOSPITAL_COMMUNITY): Payer: Self-pay

## 2018-08-22 ENCOUNTER — Ambulatory Visit (HOSPITAL_COMMUNITY)
Admission: RE | Admit: 2018-08-22 | Discharge: 2018-08-22 | Disposition: A | Discharge status: Home / Self Care | Payer: Medicare Other | Source: Ambulatory Visit | Attending: Orthopedic Surgery | Admitting: Orthopedic Surgery

## 2018-08-22 ENCOUNTER — Other Ambulatory Visit: Payer: Self-pay

## 2018-08-22 DIAGNOSIS — M1711 Unilateral primary osteoarthritis, right knee: Secondary | ICD-10-CM | POA: Diagnosis not present

## 2018-08-22 DIAGNOSIS — Z01811 Encounter for preprocedural respiratory examination: Secondary | ICD-10-CM

## 2018-08-22 HISTORY — DX: Pneumonia, unspecified organism: J18.9

## 2018-08-22 HISTORY — DX: Personal history of other diseases of the nervous system and sense organs: Z86.69

## 2018-08-22 HISTORY — DX: Personal history of diseases of the blood and blood-forming organs and certain disorders involving the immune mechanism: Z86.2

## 2018-08-22 HISTORY — DX: Gastro-esophageal reflux disease without esophagitis: K21.9

## 2018-08-22 LAB — URINALYSIS, ROUTINE W REFLEX MICROSCOPIC
Bacteria, UA: NONE SEEN
Bilirubin Urine: NEGATIVE
Glucose, UA: >=500 mg/dL — AB
Hgb urine dipstick: NEGATIVE
Ketones, ur: NEGATIVE mg/dL
Leukocytes,Ua: NEGATIVE
Nitrite: NEGATIVE
Protein, ur: NEGATIVE mg/dL
Specific Gravity, Urine: 1.014 (ref 1.005–1.030)
pH: 5 (ref 5.0–8.0)

## 2018-08-22 LAB — COMPREHENSIVE METABOLIC PANEL
ALT: 18 U/L (ref 0–44)
AST: 19 U/L (ref 15–41)
Albumin: 4.4 g/dL (ref 3.5–5.0)
Alkaline Phosphatase: 75 U/L (ref 38–126)
Anion gap: 11 (ref 5–15)
BUN: 22 mg/dL (ref 8–23)
CO2: 26 mmol/L (ref 22–32)
Calcium: 10.3 mg/dL (ref 8.9–10.3)
Chloride: 101 mmol/L (ref 98–111)
Creatinine, Ser: 1.03 mg/dL — ABNORMAL HIGH (ref 0.44–1.00)
GFR calc Af Amer: >60 mL/min (ref >60)
GFR calc non Af Amer: 55 mL/min — ABNORMAL LOW (ref >60)
Glucose, Bld: 130 mg/dL — ABNORMAL HIGH (ref 70–99)
Potassium: 3.6 mmol/L (ref 3.5–5.1)
Sodium: 138 mmol/L (ref 135–145)
Total Bilirubin: 0.4 mg/dL (ref 0.3–1.2)
Total Protein: 9 g/dL — ABNORMAL HIGH (ref 6.5–8.1)

## 2018-08-22 LAB — CBC WITH DIFFERENTIAL/PLATELET
Abs Immature Granulocytes: 0.06 10*3/uL (ref 0.00–0.07)
Basophils Absolute: 0.1 10*3/uL (ref 0.0–0.1)
Basophils Relative: 1 %
Eosinophils Absolute: 0.4 10*3/uL (ref 0.0–0.5)
Eosinophils Relative: 6 %
HCT: 39.7 % (ref 36.0–46.0)
Hemoglobin: 12.6 g/dL (ref 12.0–15.0)
Immature Granulocytes: 1 %
Lymphocytes Relative: 30 %
Lymphs Abs: 2 10*3/uL (ref 0.7–4.0)
MCH: 29.1 pg (ref 26.0–34.0)
MCHC: 31.7 g/dL (ref 30.0–36.0)
MCV: 91.7 fL (ref 80.0–100.0)
Monocytes Absolute: 0.6 10*3/uL (ref 0.1–1.0)
Monocytes Relative: 8 %
Neutro Abs: 3.7 10*3/uL (ref 1.7–7.7)
Neutrophils Relative %: 54 %
Platelets: 342 10*3/uL (ref 150–400)
RBC: 4.33 MIL/uL (ref 3.87–5.11)
RDW: 14.6 % (ref 11.5–15.5)
WBC: 6.8 10*3/uL (ref 4.0–10.5)
nRBC: 0 % (ref 0.0–0.2)

## 2018-08-22 LAB — SURGICAL PCR SCREEN
MRSA, PCR: NEGATIVE
Staphylococcus aureus: POSITIVE — AB

## 2018-08-22 LAB — GLUCOSE, CAPILLARY: Glucose-Capillary: 116 mg/dL — ABNORMAL HIGH (ref 70–99)

## 2018-08-22 LAB — APTT: aPTT: 28 seconds (ref 24–36)

## 2018-08-22 LAB — HEMOGLOBIN A1C
Hgb A1c MFr Bld: 6.7 % — ABNORMAL HIGH (ref 4.8–5.6)
Mean Plasma Glucose: 145.59 mg/dL

## 2018-08-22 LAB — PROTIME-INR
INR: 0.9 (ref 0.8–1.2)
Prothrombin Time: 12.2 seconds (ref 11.4–15.2)

## 2018-08-22 NOTE — Progress Notes (Addendum)
SPOKE W/  [place]     SCREENING SYMPTOMS OF COVID 19:   COUGH--NO  RUNNY NOSE--- NO  SORE THROAT---NO  NASAL CONGESTION----NO  SNEEZING----NO  SHORTNESS OF BREATH---NO  DIFFICULTY BREATHING---NO  TEMP >100.0 -----NO  UNEXPLAINED BODY ACHES------NO  CHILLS -------- NO  HEADACHES ---------NO  LOSS OF SMELL/ TASTE --------NO    HAVE YOU OR ANY FAMILY MEMBER TRAVELLED PAST 14 DAYS OUT OF THE   COUNTY---travelled to [facility] STATE----NO COUNTRY----NO  HAVE YOU OR ANY FAMILY MEMBER BEEN EXPOSED TO ANYONE WITH COVID 19? NO

## 2018-08-26 ENCOUNTER — Other Ambulatory Visit (HOSPITAL_COMMUNITY)
Admission: RE | Admit: 2018-08-26 | Discharge: 2018-08-26 | Disposition: A | Discharge status: Home / Self Care | Payer: Medicare Other | Source: Ambulatory Visit | Attending: Orthopedic Surgery | Admitting: Orthopedic Surgery

## 2018-08-26 DIAGNOSIS — Z01812 Encounter for preprocedural laboratory examination: Secondary | ICD-10-CM | POA: Diagnosis not present

## 2018-08-26 DIAGNOSIS — M1711 Unilateral primary osteoarthritis, right knee: Secondary | ICD-10-CM | POA: Diagnosis not present

## 2018-08-26 DIAGNOSIS — Z20828 Contact with and (suspected) exposure to other viral communicable diseases: Secondary | ICD-10-CM | POA: Insufficient documentation

## 2018-08-26 LAB — SARS CORONAVIRUS 2 (TAT 6-24 HRS): SARS Coronavirus 2: NEGATIVE

## 2018-08-27 NOTE — Anesthesia Preprocedure Evaluation (Addendum)
Anesthesia Evaluation  Patient identified by MRN, date of birth, ID band Patient awake    Reviewed: Allergy & Precautions, NPO status , Patient's Chart, lab work & pertinent test results  Airway Mallampati: II  TM Distance: >3 FB Neck ROM: Full    Dental no notable dental hx.    Pulmonary asthma , former smoker,    Pulmonary exam normal breath sounds clear to auscultation       Cardiovascular hypertension, Pt. on medications Normal cardiovascular exam Rhythm:Regular Rate:Normal  Echo 08/15/2018 with mild aortic stenosis, EF 65-70%   Neuro/Psych negative neurological ROS  negative psych ROS   GI/Hepatic negative GI ROS, Neg liver ROS,   Endo/Other  negative endocrine ROSdiabetes, Type 2  Renal/GU negative Renal ROS  negative genitourinary   Musculoskeletal negative musculoskeletal ROS (+)   Abdominal   Peds negative pediatric ROS (+)  Hematology negative hematology ROS (+)   Anesthesia Other Findings   Reproductive/Obstetrics negative OB ROS                            Anesthesia Physical Anesthesia Plan  ASA: II  Anesthesia Plan: Spinal   Post-op Pain Management:  Regional for Post-op pain   Induction: Intravenous  PONV Risk Score and Plan: 2 and Ondansetron and Treatment may vary due to age or medical condition  Airway Management Planned: Simple Face Mask  Additional Equipment:   Intra-op Plan:   Post-operative Plan: Extubation in OR  Informed Consent: I have reviewed the patients History and Physical, chart, labs and discussed the procedure including the risks, benefits and alternatives for the proposed anesthesia with the patient or authorized representative who has indicated his/her understanding and acceptance.     Dental advisory given  Plan Discussed with: CRNA  Anesthesia Plan Comments: (See PAT note 08/22/2018, Konrad Felix, PA-C)       Anesthesia Quick  Evaluation

## 2018-08-27 NOTE — Progress Notes (Addendum)
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 former smoker (10 pack years, quit 01/09/99) with h/o asthma, DM II, HLD, GERD, HTN, mild aortic stenosis, right knee DJD scheduled for above procedure 08/29/2018 with Dr. Jodi GeraldsJohn Graves.   Pt seen by cardiologist, Dr. Truett MainlandManish Patwardhan, 07/17/2018 for preoperative evaluation.  Per OV note, "Baseline functional capacity without any angina or angina equivalent symptoms.  No indication for ischemic evaluation at this time.  Given her baseline EKG abnormalities and known mild aortic stenosis, recommend echocardiogram.  Acceptable cardiac risk for long cardiac surgery of knee replacement.  In absence of bleeding, reasonable to continue aspirin 81 mg daily.  Lipid panel is suboptimal, but she has been statin intolerant and not interested in trying PCSK9 inhibitors.  Continue Zetia."  Echo 08/15/2018 with mild aortic stenosis, EF 65-70%.  Dr. Rosemary HolmsPatwardhan commented on results, "Normal heart function.  Mild changes related to hypertension.  No significant valve problems note.  Okay to proceed with elective surgery with low cardiac risk."  Pt with significantly loose right upper molar which she reports has been loose for several months.  She reports she will contact dentist. No signs of infection.  Anticipate pt can proceed with planned procedure barring acute status change.   VS: BP (!) 150/70   Pulse 61   Temp 36.9 C (Oral)   Resp 16   Ht 5' 3.5" (1.613 m)   Wt 91 kg   SpO2 100%   BMI 34.98 kg/m   PROVIDERS: Kirby FunkGriffin, John, MD is PCP   Truett MainlandPatwardhan, Manish, MD is Cardiologist  LABS: Labs reviewed: Acceptable for surgery. (all labs ordered are listed, but only abnormal results are displayed)  Labs Reviewed  SURGICAL PCR  SCREEN - Abnormal; Notable for the following components:      Result Value   Staphylococcus aureus POSITIVE (*)    All other components within normal limits  COMPREHENSIVE METABOLIC PANEL - Abnormal; Notable for the following components:   Glucose, Bld 130 (*)    Creatinine, Ser 1.03 (*)    Total Protein 9.0 (*)    GFR calc non Af Amer 55 (*)    All other components within normal limits  URINALYSIS, ROUTINE W REFLEX MICROSCOPIC - Abnormal; Notable for the following components:   Color, Urine STRAW (*)    Glucose, UA >=500 (*)    All other components within normal limits  HEMOGLOBIN A1C - Abnormal; Notable for the following components:   Hgb A1c MFr Bld 6.7 (*)    All other components within normal limits  GLUCOSE, CAPILLARY - Abnormal; Notable for the following components:   Glucose-Capillary 116 (*)    All other components within normal limits  APTT  CBC WITH DIFFERENTIAL/PLATELET  PROTIME-INR     IMAGES: Chest Xray 08/22/2018 FINDINGS: Cardiac shadow is stable. The lungs are well aerated bilaterally. No focal infiltrate or sizable effusion is seen. Degenerative changes of the thoracic spine are noted.  IMPRESSION: No acute abnormality noted.  EKG: 07/17/2018 Rate 75 bpm Sinus rhythm  Right bundle branch block  Left anterior fascicular block  Poor R wave progression  Lead Loss V6  CV: Echo 06/19/17 Study Conclusions  - Left ventricle: The cavity size was normal. Systolic function was  vigorous. The estimated ejection fraction was in the range of 65%   to 70%. Wall motion was normal; there were no regional wall   motion abnormalities. Doppler parameters are consistent with   abnormal left ventricular relaxation (grade 1 diastolic   dysfunction). Doppler parameters are consistent with elevated   ventricular end-diastolic filling pressure. - Aortic valve: Transvalvular velocity was minimally increased.   There was mild stenosis. Mean gradient (S): 9 mm Hg. Peak    gradient (S): 17 mm Hg. - Mitral valve: There was trivial regurgitation. - Tricuspid valve: There was mild regurgitation. - Pulmonary arteries: Systolic pressure was within the normal   range. - Inferior vena cava: The vessel was normal in size. - Pericardium, extracardiac: There was no pericardial effusion.  Impressions:  - Aortic valve is poorly visualized, unable to determine number of   leaflets of this study. There is mild aortic stenosis. Past Medical History:  Diagnosis Date  . Allergy   . Arthritis   . Asthma   . Diabetes mellitus without complication ([place])   . GERD (gastroesophageal reflux disease)   . Heart murmur    Grade I/VI 12/2013 at [facility] office  . History of carpal tunnel syndrome    Bilateral  . History of iron deficiency anemia   . Hyperlipidemia   . Hypertension   . Pneumonia     Past Surgical History:  Procedure Laterality Date  . BILATERAL CARPAL TUNNEL RELEASE    . COLONOSCOPY    . TOTAL KNEE ARTHROPLASTY Left 02/20/2016   Procedure: TOTAL KNEE ARTHROPLASTY;  Surgeon: Dorna Leitz, MD;  Location: [facility];  Service: Orthopedics;  Laterality: Left;  . TUBAL LIGATION    . TUBAL LIGATION      MEDICATIONS: . acetaminophen (TYLENOL) 500 MG tablet  . albuterol (PROVENTIL HFA;VENTOLIN HFA) 108 (90 Base) MCG/ACT inhaler  . amLODipine (NORVASC) 10 MG tablet  . aspirin EC 81 MG tablet  . betamethasone dipropionate 0.05 % lotion  . chlorthalidone (HYGROTON) 25 MG tablet  . Cholecalciferol (VITAMIN D3) 50 MCG (2000 UT) TABS  . Ciclopirox 1 % shampoo  . empagliflozin (JARDIANCE) 25 MG TABS tablet  . ezetimibe (ZETIA) 10 MG tablet  . glimepiride (AMARYL) 4 MG tablet  . losartan (COZAAR) 100 MG tablet  . pantoprazole (PROTONIX) 40 MG tablet  . potassium chloride (MICRO-K) 10 MEQ CR capsule   No current facility-administered medications for this encounter.     Maia Plan [facility] Pre-Surgical Testing [phone] 08/27/18  10:12 AM

## 2018-08-28 MED ORDER — BUPIVACAINE LIPOSOME 1.3 % IJ SUSP
20.0000 mL | Freq: Once | INTRAMUSCULAR | Status: AC
  Filled 2018-08-28: qty 20

## 2018-08-29 ENCOUNTER — Other Ambulatory Visit: Payer: Self-pay

## 2018-08-29 ENCOUNTER — Ambulatory Visit (HOSPITAL_COMMUNITY)
Admission: RE | Admit: 2018-08-29 | Discharge: 2018-08-31 | Disposition: A | Discharge status: Home Health | Payer: Medicare Other | Attending: Orthopedic Surgery | Admitting: Orthopedic Surgery

## 2018-08-29 ENCOUNTER — Encounter (HOSPITAL_COMMUNITY): Payer: Self-pay | Admitting: Emergency Medicine

## 2018-08-29 ENCOUNTER — Ambulatory Visit (HOSPITAL_COMMUNITY): Payer: Medicare Other | Admitting: Physician Assistant

## 2018-08-29 ENCOUNTER — Ambulatory Visit (HOSPITAL_COMMUNITY): Payer: Medicare Other | Admitting: Certified Registered Nurse Anesthetist

## 2018-08-29 ENCOUNTER — Encounter (HOSPITAL_COMMUNITY)
Admission: RE | Disposition: A | Discharge status: Home Health | Payer: Self-pay | Source: Home / Self Care | Attending: Orthopedic Surgery

## 2018-08-29 DIAGNOSIS — D62 Acute posthemorrhagic anemia: Secondary | ICD-10-CM | POA: Insufficient documentation

## 2018-08-29 DIAGNOSIS — I10 Essential (primary) hypertension: Secondary | ICD-10-CM | POA: Insufficient documentation

## 2018-08-29 DIAGNOSIS — Z6834 Body mass index (BMI) 34.0-34.9, adult: Secondary | ICD-10-CM | POA: Insufficient documentation

## 2018-08-29 DIAGNOSIS — Z96652 Presence of left artificial knee joint: Secondary | ICD-10-CM | POA: Insufficient documentation

## 2018-08-29 DIAGNOSIS — K5900 Constipation, unspecified: Secondary | ICD-10-CM | POA: Insufficient documentation

## 2018-08-29 DIAGNOSIS — Z79899 Other long term (current) drug therapy: Secondary | ICD-10-CM | POA: Insufficient documentation

## 2018-08-29 DIAGNOSIS — E119 Type 2 diabetes mellitus without complications: Secondary | ICD-10-CM | POA: Diagnosis not present

## 2018-08-29 DIAGNOSIS — Z87891 Personal history of nicotine dependence: Secondary | ICD-10-CM | POA: Diagnosis not present

## 2018-08-29 DIAGNOSIS — K219 Gastro-esophageal reflux disease without esophagitis: Secondary | ICD-10-CM | POA: Insufficient documentation

## 2018-08-29 DIAGNOSIS — M1711 Unilateral primary osteoarthritis, right knee: Secondary | ICD-10-CM | POA: Insufficient documentation

## 2018-08-29 HISTORY — PX: TOTAL KNEE ARTHROPLASTY: SHX125

## 2018-08-29 LAB — GLUCOSE, CAPILLARY
Glucose-Capillary: 140 mg/dL — ABNORMAL HIGH (ref 70–99)
Glucose-Capillary: 121 mg/dL — ABNORMAL HIGH (ref 70–99)
Glucose-Capillary: 194 mg/dL — ABNORMAL HIGH (ref 70–99)
Glucose-Capillary: 161 mg/dL — ABNORMAL HIGH (ref 70–99)

## 2018-08-29 SURGERY — ARTHROPLASTY, KNEE, TOTAL
Anesthesia: Spinal | Site: Knee | Laterality: Right

## 2018-08-29 MED ORDER — 0.9 % SODIUM CHLORIDE (POUR BTL) OPTIME
TOPICAL | Status: DC | PRN
  Administered 2018-08-29: 1000 mL

## 2018-08-29 MED ORDER — SODIUM CHLORIDE (PF) 0.9 % IJ SOLN
INTRAMUSCULAR | Status: AC
  Filled 2018-08-29: qty 50

## 2018-08-29 MED ORDER — LACTATED RINGERS IV SOLN: INTRAVENOUS | Status: DC

## 2018-08-29 MED ORDER — CEFAZOLIN SODIUM-DEXTROSE 2-4 GM/100ML-% IV SOLN
2.0000 g | INTRAVENOUS | Status: AC
  Administered 2018-08-29: 2 g via INTRAVENOUS
  Filled 2018-08-29: qty 100

## 2018-08-29 MED ORDER — MIDAZOLAM HCL 2 MG/2ML IJ SOLN
INTRAMUSCULAR | Status: AC
  Filled 2018-08-29: qty 2

## 2018-08-29 MED ORDER — METOCLOPRAMIDE HCL 5 MG/ML IJ SOLN: 10.0000 mg | Freq: Once | INTRAMUSCULAR | Status: DC | PRN

## 2018-08-29 MED ORDER — DIPHENHYDRAMINE HCL 12.5 MG/5ML PO ELIX: 12.5000 mg | ORAL_SOLUTION | ORAL | Status: DC | PRN

## 2018-08-29 MED ORDER — GABAPENTIN 300 MG PO CAPS
300.0000 mg | ORAL_CAPSULE | Freq: Two times a day (BID) | ORAL | Status: DC
  Administered 2018-08-29 – 2018-08-31 (×5): 300 mg via ORAL
  Filled 2018-08-29 (×5): qty 1

## 2018-08-29 MED ORDER — MEPERIDINE HCL 50 MG/ML IJ SOLN: 6.2500 mg | INTRAMUSCULAR | Status: DC | PRN

## 2018-08-29 MED ORDER — INSULIN ASPART 100 UNIT/ML ~~LOC~~ SOLN
0.0000 [IU] | Freq: Three times a day (TID) | SUBCUTANEOUS | Status: DC
  Administered 2018-08-29 – 2018-08-31 (×4): 3 [IU] via SUBCUTANEOUS

## 2018-08-29 MED ORDER — TRANEXAMIC ACID-NACL 1000-0.7 MG/100ML-% IV SOLN
1000.0000 mg | INTRAVENOUS | Status: AC
  Administered 2018-08-29: 1000 mg via INTRAVENOUS
  Filled 2018-08-29: qty 100

## 2018-08-29 MED ORDER — POVIDONE-IODINE 10 % EX SWAB
2.0000 "application " | Freq: Once | CUTANEOUS | Status: AC
  Administered 2018-08-29: 2 via TOPICAL

## 2018-08-29 MED ORDER — LACTATED RINGERS IV SOLN
INTRAVENOUS | Status: DC
  Administered 2018-08-29 (×2): via INTRAVENOUS

## 2018-08-29 MED ORDER — CHLORTHALIDONE 25 MG PO TABS
25.0000 mg | ORAL_TABLET | Freq: Every day | ORAL | Status: DC
  Administered 2018-08-29 – 2018-08-30 (×2): 25 mg via ORAL
  Filled 2018-08-29: qty 1

## 2018-08-29 MED ORDER — LOSARTAN POTASSIUM 50 MG PO TABS
100.0000 mg | ORAL_TABLET | Freq: Every day | ORAL | Status: DC
  Filled 2018-08-29: qty 2

## 2018-08-29 MED ORDER — PROPOFOL 500 MG/50ML IV EMUL
INTRAVENOUS | Status: DC | PRN
  Administered 2018-08-29: 50 ug/kg/min via INTRAVENOUS

## 2018-08-29 MED ORDER — TRANEXAMIC ACID-NACL 1000-0.7 MG/100ML-% IV SOLN
1000.0000 mg | Freq: Once | INTRAVENOUS | Status: AC
  Administered 2018-08-29: 1000 mg via INTRAVENOUS
  Filled 2018-08-29 (×2): qty 100

## 2018-08-29 MED ORDER — CANAGLIFLOZIN 100 MG PO TABS
100.0000 mg | ORAL_TABLET | Freq: Every day | ORAL | Status: DC
  Administered 2018-08-30 – 2018-08-31 (×2): 100 mg via ORAL
  Filled 2018-08-29 (×2): qty 1

## 2018-08-29 MED ORDER — PROPOFOL 10 MG/ML IV BOLUS
INTRAVENOUS | Status: DC | PRN
  Administered 2018-08-29: 20 mg via INTRAVENOUS

## 2018-08-29 MED ORDER — ONDANSETRON HCL 4 MG/2ML IJ SOLN
INTRAMUSCULAR | Status: AC
  Filled 2018-08-29: qty 2

## 2018-08-29 MED ORDER — ALUM & MAG HYDROXIDE-SIMETH 200-200-20 MG/5ML PO SUSP
30.0000 mL | ORAL | Status: DC | PRN
  Filled 2018-08-29: qty 30

## 2018-08-29 MED ORDER — ONDANSETRON HCL 4 MG/2ML IJ SOLN
INTRAMUSCULAR | Status: DC | PRN
  Administered 2018-08-29: 4 mg via INTRAVENOUS

## 2018-08-29 MED ORDER — MIDAZOLAM HCL 5 MG/5ML IJ SOLN
INTRAMUSCULAR | Status: DC | PRN
  Administered 2018-08-29 (×2): 1 mg via INTRAVENOUS

## 2018-08-29 MED ORDER — BUPIVACAINE LIPOSOME 1.3 % IJ SUSP
INTRAMUSCULAR | Status: DC | PRN
  Administered 2018-08-29: 20 mL

## 2018-08-29 MED ORDER — BUPIVACAINE-EPINEPHRINE (PF) 0.25% -1:200000 IJ SOLN
INTRAMUSCULAR | Status: AC
  Filled 2018-08-29: qty 30

## 2018-08-29 MED ORDER — LOSARTAN POTASSIUM 50 MG PO TABS
100.0000 mg | ORAL_TABLET | Freq: Every day | ORAL | Status: DC
  Administered 2018-08-29 – 2018-08-31 (×3): 100 mg via ORAL
  Filled 2018-08-29 (×2): qty 2

## 2018-08-29 MED ORDER — CHLORTHALIDONE 25 MG PO TABS
25.0000 mg | ORAL_TABLET | Freq: Every day | ORAL | Status: DC
  Filled 2018-08-29: qty 1

## 2018-08-29 MED ORDER — PROPOFOL 10 MG/ML IV BOLUS
INTRAVENOUS | Status: AC
  Filled 2018-08-29: qty 20

## 2018-08-29 MED ORDER — TIZANIDINE HCL 2 MG PO TABS: 2.0000 mg | ORAL_TABLET | Freq: Three times a day (TID) | ORAL | 0 refills | Status: DC | PRN

## 2018-08-29 MED ORDER — FENTANYL CITRATE (PF) 100 MCG/2ML IJ SOLN
25.0000 ug | INTRAMUSCULAR | Status: DC | PRN
  Administered 2018-08-29 (×2): 50 ug via INTRAVENOUS

## 2018-08-29 MED ORDER — DOCUSATE SODIUM 100 MG PO CAPS
100.0000 mg | ORAL_CAPSULE | Freq: Two times a day (BID) | ORAL | Status: DC
  Administered 2018-08-29 – 2018-08-31 (×4): 100 mg via ORAL
  Filled 2018-08-29 (×5): qty 1

## 2018-08-29 MED ORDER — ROPIVACAINE HCL 5 MG/ML IJ SOLN
INTRAMUSCULAR | Status: DC | PRN
  Administered 2018-08-29: 30 mL via EPIDURAL

## 2018-08-29 MED ORDER — HYDROMORPHONE HCL 1 MG/ML IJ SOLN
0.5000 mg | INTRAMUSCULAR | Status: DC | PRN
  Administered 2018-08-29: 1 mg via INTRAVENOUS

## 2018-08-29 MED ORDER — ALBUTEROL SULFATE (2.5 MG/3ML) 0.083% IN NEBU: 2.5000 mg | INHALATION_SOLUTION | Freq: Four times a day (QID) | RESPIRATORY_TRACT | Status: DC | PRN

## 2018-08-29 MED ORDER — GLIMEPIRIDE 2 MG PO TABS
2.0000 mg | ORAL_TABLET | Freq: Every day | ORAL | Status: DC
  Administered 2018-08-30 – 2018-08-31 (×2): 2 mg via ORAL
  Filled 2018-08-29 (×2): qty 1

## 2018-08-29 MED ORDER — FENTANYL CITRATE (PF) 100 MCG/2ML IJ SOLN
INTRAMUSCULAR | Status: DC | PRN
  Administered 2018-08-29: 50 ug via INTRAVENOUS

## 2018-08-29 MED ORDER — FENTANYL CITRATE (PF) 100 MCG/2ML IJ SOLN: 25.0000 ug | INTRAMUSCULAR | Status: DC | PRN

## 2018-08-29 MED ORDER — CHLORHEXIDINE GLUCONATE 4 % EX LIQD: 60.0000 mL | Freq: Once | CUTANEOUS | Status: DC

## 2018-08-29 MED ORDER — BISACODYL 5 MG PO TBEC
5.0000 mg | DELAYED_RELEASE_TABLET | Freq: Every day | ORAL | Status: DC | PRN
  Filled 2018-08-29: qty 1

## 2018-08-29 MED ORDER — FENTANYL CITRATE (PF) 100 MCG/2ML IJ SOLN
INTRAMUSCULAR | Status: AC
  Filled 2018-08-29: qty 2

## 2018-08-29 MED ORDER — PHENYLEPHRINE HCL (PRESSORS) 10 MG/ML IV SOLN
INTRAVENOUS | Status: AC
  Filled 2018-08-29: qty 1

## 2018-08-29 MED ORDER — CEFAZOLIN SODIUM-DEXTROSE 2-4 GM/100ML-% IV SOLN
2.0000 g | Freq: Four times a day (QID) | INTRAVENOUS | Status: AC
  Administered 2018-08-29 (×2): 2 g via INTRAVENOUS
  Filled 2018-08-29 (×2): qty 100

## 2018-08-29 MED ORDER — PROPOFOL 10 MG/ML IV BOLUS
INTRAVENOUS | Status: AC
  Filled 2018-08-29: qty 60

## 2018-08-29 MED ORDER — POTASSIUM CHLORIDE CRYS ER 10 MEQ PO TBCR
10.0000 meq | EXTENDED_RELEASE_TABLET | Freq: Every day | ORAL | Status: DC
  Administered 2018-08-30 – 2018-08-31 (×2): 10 meq via ORAL
  Filled 2018-08-29 (×2): qty 1

## 2018-08-29 MED ORDER — PANTOPRAZOLE SODIUM 40 MG PO TBEC
40.0000 mg | DELAYED_RELEASE_TABLET | Freq: Every day | ORAL | Status: DC
  Administered 2018-08-30 – 2018-08-31 (×2): 40 mg via ORAL
  Filled 2018-08-29 (×2): qty 1

## 2018-08-29 MED ORDER — METOCLOPRAMIDE HCL 5 MG/ML IJ SOLN
10.0000 mg | Freq: Once | INTRAMUSCULAR | Status: AC | PRN
  Administered 2018-08-29: 10 mg via INTRAVENOUS

## 2018-08-29 MED ORDER — OXYCODONE-ACETAMINOPHEN 5-325 MG PO TABS: 1.0000 | ORAL_TABLET | Freq: Four times a day (QID) | ORAL | 0 refills | Status: DC | PRN

## 2018-08-29 MED ORDER — ONDANSETRON HCL 4 MG PO TABS
4.0000 mg | ORAL_TABLET | Freq: Four times a day (QID) | ORAL | Status: DC | PRN
  Filled 2018-08-29: qty 1

## 2018-08-29 MED ORDER — OXYCODONE HCL 5 MG PO TABS
5.0000 mg | ORAL_TABLET | ORAL | Status: DC | PRN
  Administered 2018-08-29: 10 mg via ORAL
  Administered 2018-08-29: 5 mg via ORAL
  Administered 2018-08-29 – 2018-08-30 (×2): 10 mg via ORAL
  Administered 2018-08-30 – 2018-08-31 (×5): 5 mg via ORAL
  Filled 2018-08-29 (×2): qty 1
  Filled 2018-08-29: qty 2
  Filled 2018-08-29: qty 1
  Filled 2018-08-29: qty 2
  Filled 2018-08-29 (×2): qty 1
  Filled 2018-08-29 (×2): qty 2

## 2018-08-29 MED ORDER — ASPIRIN EC 325 MG PO TBEC
325.0000 mg | DELAYED_RELEASE_TABLET | Freq: Two times a day (BID) | ORAL | Status: DC
  Administered 2018-08-29 – 2018-08-31 (×4): 325 mg via ORAL
  Filled 2018-08-29 (×4): qty 1

## 2018-08-29 MED ORDER — AMLODIPINE BESYLATE 10 MG PO TABS
10.0000 mg | ORAL_TABLET | Freq: Every day | ORAL | Status: DC
  Administered 2018-08-30 – 2018-08-31 (×2): 10 mg via ORAL
  Filled 2018-08-29 (×2): qty 1

## 2018-08-29 MED ORDER — ONDANSETRON HCL 4 MG/2ML IJ SOLN
4.0000 mg | Freq: Four times a day (QID) | INTRAMUSCULAR | Status: DC | PRN
  Administered 2018-08-29 – 2018-08-30 (×2): 4 mg via INTRAVENOUS
  Filled 2018-08-29 (×2): qty 2

## 2018-08-29 MED ORDER — DOCUSATE SODIUM 100 MG PO CAPS: 100.0000 mg | ORAL_CAPSULE | Freq: Two times a day (BID) | ORAL | 0 refills | Status: DC

## 2018-08-29 MED ORDER — DEXAMETHASONE SODIUM PHOSPHATE 10 MG/ML IJ SOLN
10.0000 mg | Freq: Two times a day (BID) | INTRAMUSCULAR | Status: AC
  Administered 2018-08-30 (×2): 10 mg via INTRAVENOUS
  Filled 2018-08-29 (×2): qty 1

## 2018-08-29 MED ORDER — SODIUM CHLORIDE 0.9% FLUSH
INTRAVENOUS | Status: DC | PRN
  Administered 2018-08-29: 50 mL

## 2018-08-29 MED ORDER — ASPIRIN EC 325 MG PO TBEC: 325.0000 mg | DELAYED_RELEASE_TABLET | Freq: Two times a day (BID) | ORAL | 0 refills | Status: DC

## 2018-08-29 MED ORDER — POLYETHYLENE GLYCOL 3350 17 G PO PACK
17.0000 g | PACK | Freq: Every day | ORAL | Status: DC | PRN
  Administered 2018-08-31: 09:00:00 17 g via ORAL
  Filled 2018-08-29 (×2): qty 1

## 2018-08-29 MED ORDER — BUPIVACAINE IN DEXTROSE 0.75-8.25 % IT SOLN
INTRATHECAL | Status: DC | PRN
  Administered 2018-08-29: 1.8 mL via INTRATHECAL

## 2018-08-29 MED ORDER — AMLODIPINE BESYLATE 10 MG PO TABS: 10.0000 mg | ORAL_TABLET | Freq: Every day | ORAL | Status: DC

## 2018-08-29 MED ORDER — ACETAMINOPHEN 325 MG PO TABS
325.0000 mg | ORAL_TABLET | Freq: Four times a day (QID) | ORAL | Status: DC | PRN
  Administered 2018-08-30 – 2018-08-31 (×2): 650 mg via ORAL
  Filled 2018-08-29 (×2): qty 2

## 2018-08-29 MED ORDER — SODIUM CHLORIDE 0.9 % IR SOLN
Status: DC | PRN
  Administered 2018-08-29: 1000 mL

## 2018-08-29 MED ORDER — BUPIVACAINE-EPINEPHRINE 0.5% -1:200000 IJ SOLN
INTRAMUSCULAR | Status: AC
  Filled 2018-08-29: qty 1

## 2018-08-29 MED ORDER — METOCLOPRAMIDE HCL 5 MG/ML IJ SOLN
INTRAMUSCULAR | Status: AC
  Filled 2018-08-29: qty 2

## 2018-08-29 MED ORDER — GLYCOPYRROLATE PF 0.2 MG/ML IJ SOSY
PREFILLED_SYRINGE | INTRAMUSCULAR | Status: DC | PRN
  Administered 2018-08-29: .2 mg via INTRAVENOUS

## 2018-08-29 MED ORDER — HYDROMORPHONE HCL 1 MG/ML IJ SOLN
INTRAMUSCULAR | Status: AC
  Filled 2018-08-29: qty 1

## 2018-08-29 MED ORDER — OXYCODONE HCL 5 MG PO TABS
ORAL_TABLET | ORAL | Status: AC
  Filled 2018-08-29: qty 1

## 2018-08-29 MED ORDER — BUPIVACAINE-EPINEPHRINE 0.5% -1:200000 IJ SOLN
INTRAMUSCULAR | Status: DC | PRN
  Administered 2018-08-29: 50 mL

## 2018-08-29 MED ORDER — SODIUM CHLORIDE 0.9 % IV SOLN
INTRAVENOUS | Status: DC
  Administered 2018-08-29: 13:00:00 via INTRAVENOUS

## 2018-08-29 MED ORDER — MAGNESIUM CITRATE PO SOLN
1.0000 | Freq: Once | ORAL | Status: DC | PRN
  Filled 2018-08-29: qty 296

## 2018-08-29 SURGICAL SUPPLY — 51 items
ATTUNE PS FEM RT SZ 4 CEM KNEE (Femur) ×2 IMPLANT
ATTUNE PSRP INSR SZ4 6 KNEE (Insert) ×2 IMPLANT
BAG ZIPLOCK 12X15 (MISCELLANEOUS) ×2 IMPLANT
BASEPLATE TIBIAL ROTATING SZ 4 (Knees) ×2 IMPLANT
BENZOIN TINCTURE PRP APPL 2/3 (GAUZE/BANDAGES/DRESSINGS) ×2 IMPLANT
BLADE SAGITTAL 25.0X1.19X90 (BLADE) ×2 IMPLANT
BLADE SAW SGTL 11.0X1.19X90.0M (BLADE) IMPLANT
BLADE SURG SZ10 CARB STEEL (BLADE) ×2 IMPLANT
BNDG ELASTIC 6X10 VLCR STRL LF (GAUZE/BANDAGES/DRESSINGS) IMPLANT
BNDG ELASTIC 6X5.8 VLCR STR LF (GAUZE/BANDAGES/DRESSINGS) ×2 IMPLANT
BOOTIES KNEE HIGH SLOAN (MISCELLANEOUS) ×2 IMPLANT
BOWL SMART MIX CTS (DISPOSABLE) ×2 IMPLANT
CEMENT HV SMART SET (Cement) ×4 IMPLANT
CLSR STERI-STRIP ANTIMIC 1/2X4 (GAUZE/BANDAGES/DRESSINGS) ×2 IMPLANT
COVER SURGICAL LIGHT HANDLE (MISCELLANEOUS) ×2 IMPLANT
COVER WAND RF STERILE (DRAPES) ×2 IMPLANT
CUFF TOURN SGL QUICK 34 (TOURNIQUET CUFF) ×1
CUFF TRNQT CYL 34X4.125X (TOURNIQUET CUFF) ×1 IMPLANT
DECANTER SPIKE VIAL GLASS SM (MISCELLANEOUS) ×4 IMPLANT
DRAPE U-SHAPE 47X51 STRL (DRAPES) ×2 IMPLANT
DRSG AQUACEL AG ADV 3.5X10 (GAUZE/BANDAGES/DRESSINGS) ×2 IMPLANT
DURAPREP 26ML APPLICATOR (WOUND CARE) ×2 IMPLANT
ELECT REM PT RETURN 15FT ADLT (MISCELLANEOUS) ×2 IMPLANT
GLOVE BIOGEL PI IND STRL 8 (GLOVE) ×2 IMPLANT
GLOVE BIOGEL PI INDICATOR 8 (GLOVE) ×2
GLOVE ECLIPSE 7.5 STRL STRAW (GLOVE) ×4 IMPLANT
GOWN STRL REUS W/TWL XL LVL3 (GOWN DISPOSABLE) ×4 IMPLANT
HANDPIECE INTERPULSE COAX TIP (DISPOSABLE) ×1
HOLDER FOLEY CATH W/STRAP (MISCELLANEOUS) IMPLANT
HOOD PEEL AWAY FLYTE STAYCOOL (MISCELLANEOUS) ×6 IMPLANT
KIT TURNOVER KIT A (KITS) IMPLANT
MANIFOLD NEPTUNE II (INSTRUMENTS) ×2 IMPLANT
NEEDLE HYPO 22GX1.5 SAFETY (NEEDLE) ×2 IMPLANT
NS IRRIG 1000ML POUR BTL (IV SOLUTION) ×2 IMPLANT
PACK ICE MAXI GEL EZY WRAP (MISCELLANEOUS) ×2 IMPLANT
PACK TOTAL KNEE CUSTOM (KITS) ×2 IMPLANT
PADDING CAST COTTON 6X4 STRL (CAST SUPPLIES) ×2 IMPLANT
PATELLA MEDIAL ATTUN 35MM KNEE (Knees) ×2 IMPLANT
PIN STEINMAN FIXATION KNEE (PIN) ×2 IMPLANT
PIN THREADED HEADED SIGMA (PIN) ×2 IMPLANT
PROTECTOR NERVE ULNAR (MISCELLANEOUS) ×2 IMPLANT
SET HNDPC FAN SPRY TIP SCT (DISPOSABLE) ×1 IMPLANT
STRIP CLOSURE SKIN 1/2X4 (GAUZE/BANDAGES/DRESSINGS) IMPLANT
SUT MNCRL AB 3-0 PS2 18 (SUTURE) ×2 IMPLANT
SUT VIC AB 0 CT1 36 (SUTURE) ×2 IMPLANT
SUT VIC AB 1 CT1 36 (SUTURE) ×4 IMPLANT
SYR CONTROL 10ML LL (SYRINGE) ×4 IMPLANT
TRAY FOLEY MTR SLVR 16FR STAT (SET/KITS/TRAYS/PACK) ×2 IMPLANT
WATER STERILE IRR 1000ML POUR (IV SOLUTION) ×4 IMPLANT
WRAP KNEE MAXI GEL POST OP (GAUZE/BANDAGES/DRESSINGS) ×2 IMPLANT
YANKAUER SUCT BULB TIP 10FT TU (MISCELLANEOUS) ×2 IMPLANT

## 2018-08-29 NOTE — Progress Notes (Signed)
Dr Marcell Barlow aware of elevated in [id number].   No orders received. May tx pt to room at this time.

## 2018-08-29 NOTE — Anesthesia Procedure Notes (Signed)
Anesthesia Regional Block: Adductor canal block   Pre-Anesthetic Checklist: ,, timeout performed, Correct Patient, Correct Site, Correct Laterality, Correct Procedure, Correct Position, site marked, Risks and benefits discussed,  Surgical consent,  Pre-op evaluation,  At surgeon's request and post-op pain management  Laterality: Right and Lower  Prep: Maximum Sterile Barrier Precautions used, chloraprep       Needles:  Injection technique: Single-shot  Needle Type: Echogenic Stimulator Needle     Needle Length: 10cm      Additional Needles:   Procedures:,,,, ultrasound used (permanent image in chart),,,,  Narrative:  Start time: 08/29/2018 7:04 AM End time: 08/29/2018 7:14 AM Injection made incrementally with aspirations every 5 mL.  Performed by: Personally  Anesthesiologist: Montez Hageman, MD  Additional Notes: Risks, benefits and alternative to block explained extensively.  Patient tolerated procedure well, without complications.

## 2018-08-29 NOTE — Op Note (Signed)
PATIENT ID:      Isabella Oliver  MRN:     [id number] DOB/AGE:    [birth year] / 70 y.o.       OPERATIVE REPORT    DATE OF PROCEDURE:  08/29/2018       PREOPERATIVE DIAGNOSIS:   DEGENERATIVE JOUINT DISEASE RIGHT KNEE      Estimated body mass index is 34.98 kg/m as calculated from the following:   Height as of this encounter: 5' 3.5" (1.613 m).   Weight as of this encounter: 91 kg.                                                        POSTOPERATIVE DIAGNOSIS:   DEGENERATIVE JOUINT DISEASE RIGHT KNEE                                                                      PROCEDURE:  Procedure(s): TOTAL KNEE ARTHROPLASTY Using DepuyAttune RP implants #4 Femur, #4Tibia, 6 mm Attune RP bearing, 35 Patella     SURGEON: Takiyah Bohnsack Starling Manns    ASSISTANT:  Jim bethunePA-C   (Present and scrubbed throughout the case, critical for assistance with exposure, retraction, instrumentation, and closure.)         ANESTHESIA: spinal, 20cc Exparel, 50cc 0.25% Marcaine  EBL: min cc  FLUID REPLACEMENT: unk cc crystaloid  Tourniquet Time: None  Drains: None  Tranexamic Acid: 1gm IV, 2gm topical  Exparel:    COMPLICATIONS:  None         INDICATIONS FOR PROCEDURE: The patient has  DEGENERATIVE JOUINT DISEASE RIGHT KNEE, mild varus deformities, XR shows bone on bone arthritis, lateral subluxation of tibia. Patient has failed all conservative measures including anti-inflammatory medicines, narcotics, attempts at exercise and weight loss, cortisone injections and viscosupplementation.  Risks and benefits of surgery have been discussed, questions answered.   DESCRIPTION OF PROCEDURE: The patient identified by armband, received  IV antibiotics, in the holding area at [facility]. Patient taken to the operating room, appropriate anesthetic monitors were attached, and spinal anesthesia was  induced. IV Tranexamic acid was given.Tourniquet applied high to the operative thigh. Lateral post and foot positioner  applied to the table, the lower extremity was then prepped and draped in usual sterile fashion from the toes to the tourniquet. Time-out procedure was performed. The skin and subcutaneous tissue along the incision was injected with 20 cc of a mixture of Exparel and Marcaine solution, using a 20-gauge by 1-1/2 inch needle. We began the operation, with the knee flexed 130 degrees, by making the anterior midline incision starting at handbreadth above the patella going over the patella 1 cm medial to and 4 cm distal to the tibial tubercle. Small bleeders in the skin and the subcutaneous tissue identified and cauterized. Transverse retinaculum was incised and reflected medially and a medial parapatellar arthrotomy was accomplished. the patella was everted and theprepatellar fat pad resected. The superficial medial collateral ligament was then elevated from anterior to posterior along the proximal flare of the tibia and anterior half of the menisci resected. The knee  was hyperflexed exposing bone on bone arthritis. Peripheral and notch osteophytes as well as the cruciate ligaments were then resected. We continued to work our way around posteriorly along the proximal tibia, and externally rotated the tibia subluxing it out from underneath the femur. A McHale retractor was placed through the notch and a lateral Hohmann retractor placed, and we then drilled through the proximal tibia in line with the axis of the tibia followed by an intramedullary guide rod and 2-degree posterior slope cutting guide. The tibial cutting guide, 3 degree posterior sloped, was pinned into place allowing resection of 2 mm of bone medially and 10 mm of bone laterally. Satisfied with the tibial resection, we then entered the distal femur 2 mm anterior to the PCL origin with the intramedullary guide rod and applied the distal femoral cutting guide set at 9 mm, with 5 degrees of valgus. This was pinned along the epicondylar axis. At this point, the  distal femoral cut was accomplished without difficulty. We then sized for a #4 femoral component and pinned the guide in 3 degrees of external rotation. The chamfer cutting guide was pinned into place. The anterior, posterior, and chamfer cuts were accomplished without difficulty followed by the Attune RP box cutting guide and the box cut. We also removed posterior osteophytes from the posterior femoral condyles. The posterior capsule was injected with Exparel solution. The knee was brought into full extension. We checked our extension gap and fit a 6 mm bearing. Distracting in extension with a lamina spreader,  bleeders in the posterior capsule, Posterior medial and posterior lateral right down and cauterized.  The transexamic acid-soaked sponge was then placed in the gap of the knee and extension. The knee was flexed 30. The posterior patella cut was accomplished with the 9.5 mm Attune cutting guide, sized for a 35mm dome, and the fixation pegs drilled.The knee was then once again hyperflexed exposing the proximal tibia. We sized for a # 4 tibial base plate, applied the smokestack and the conical reamer followed by the the Delta fin keel punch. We then hammered into place the Attune RP trial femoral component, drilled the lugs, inserted a  6 mm trial bearing, trial patellar button, and took the knee through range of motion from 0-130 degrees. Medial and lateral ligamentous stability was checked. No thumb pressure was required for patellar Tracking. The tourniquet was 45. All trial components were removed, mating surfaces irrigated with pulse lavage, and dried with suction and sponges. 10 cc of the Exparel solution was applied to the cancellus bone of the patella distal femur and proximal tibia.  After waiting 30 seconds, the bony surfaces were again, dried with sponges. A double batch of DePuy HV cement was mixed and applied to all bony metallic mating surfaces except for the posterior condyles of the femur  itself. In order, we hammered into place the tibial tray and removed excess cement, the femoral component and removed excess cement. The final Attune RP bearing was inserted, and the knee brought to full extension with compression. The patellar button was clamped into place, and excess cement removed. The knee was held at 30 flexion with compression, while the cement cured. The wound was irrigated out with normal saline solution pulse lavage. The rest of the Exparel was injected into the parapatellar arthrotomy, subcutaneous tissues, and periosteal tissues. The parapatellar arthrotomy was closed with running #1 Vicryl suture. The subcutaneous tissue with 0 and 2-0 undyed Vicryl suture, and the skin with running 3-0 SQ vicryl.  An Aquacil and Ace wrap were applied. The patient was taken to recovery room without difficulty.   Alta Corning 08/29/2018, 8:46 AM

## 2018-08-29 NOTE — Evaluation (Signed)
Physical Therapy Evaluation Patient Details Name: Isabella BourgeoisZenobia F Oliver MRN: [id number] DOB: 08-Oct-1950 Today's Date: 08/29/2018   History of Present Illness  68 yo female s/p R TKR on 08/29/18. PMH includes aortic stenosis, OA, DM, HTN, HLD, L TKR.  Clinical Impression  Pt presents with R knee pain, decreased R knee ROM and strength, difficulty performing mobility tasks, decrased activity tolerance, and N/V post-operatively. Pt to benefit from acute PT to address deficits. Pt ambulated short room distance with min assist for R knee guarding and knee extension facilitation. Pt educated on ankle pumps (20/hour) to perform this afternoon/evening to increase circulation, to pt's tolerance and limited by pain. PT to progress mobility as tolerated, and will continue to follow acutely.        Follow Up Recommendations Follow surgeon's recommendation for DC plan and follow-up therapies;Supervision for mobility/OOB(HHPT)    Equipment Recommendations  None recommended by PT    Recommendations for Other Services       Precautions / Restrictions Precautions Precautions: Fall Restrictions Weight Bearing Restrictions: No Other Position/Activity Restrictions: WBAT      Mobility  Bed Mobility Overal bed mobility: Needs Assistance Bed Mobility: Supine to Sit     Supine to sit: HOB elevated;Min assist     General bed mobility comments: Min assist for RLE lifting and translation to EOB. VErbal cuing for sequencing, increased time and effort.  Transfers Overall transfer level: Needs assistance Equipment used: Rolling walker (2 wheeled) Transfers: Sit to/from Stand Sit to Stand: Min guard;From elevated surface         General transfer comment: Min guard for safety, verbal cuing for hand placement.  Ambulation/Gait Ambulation/Gait assistance: Min assist;+2 safety/equipment Gait Distance (Feet): 8 Feet Assistive device: Rolling walker (2 wheeled) Gait Pattern/deviations: Step-to  pattern;Decreased step length - right;Decreased step length - left;Antalgic;Decreased weight shift to right;Decreased stance time - right Gait velocity: decr   General Gait Details: Min assist for steadying, guarding of RLE due to RLE weakness. Verbal cuing for sequencing, placement in RW. Pt limited by wooziness and N/V.  Stairs            Wheelchair Mobility    Modified Rankin (Stroke Patients Only)       Balance Overall balance assessment: Mild deficits observed, not formally tested                                           Pertinent Vitals/Pain Pain Assessment: 0-10 Pain Score: 6  Pain Location: R knee Pain Descriptors / Indicators: Sore Pain Intervention(s): Limited activity within patient's tolerance;Monitored during session;Premedicated before session;Repositioned;Ice applied    Home Living Family/patient expects to be discharged to:: Private residence Living Arrangements: Children Available Help at Discharge: Family;Available PRN/intermittently(son to stay with pt at night) Type of Home: Apartment Home Access: Stairs to enter Entrance Stairs-Rails: None Entrance Stairs-Number of Steps: 2 Home Layout: One level Home Equipment: Walker - 2 wheels;Cane - single point;Bedside commode      Prior Function Level of Independence: Independent with assistive device(s)         Comments: Pt reports using cane for ambulation PTA     Hand Dominance   Dominant Hand: Right    Extremity/Trunk Assessment   Upper Extremity Assessment Upper Extremity Assessment: Overall WFL for tasks assessed    Lower Extremity Assessment Lower Extremity Assessment: Overall WFL for tasks assessed;RLE deficits/detail RLE Deficits / Details:  suspected post-surgical weakness; able to perform ankle pumps, quad set, heel slide to 45* limited by pain/stiffness, and SLR with mod lift assist RLE Sensation: WNL    Cervical / Trunk Assessment Cervical / Trunk  Assessment: Normal  Communication   Communication: No difficulties  Cognition Arousal/Alertness: Awake/alert Behavior During Therapy: WFL for tasks assessed/performed Overall Cognitive Status: Within Functional Limits for tasks assessed                                        General Comments      Exercises     Assessment/Plan    PT Assessment Patient needs continued PT services  PT Problem List Decreased strength;Decreased mobility;Decreased range of motion;Decreased activity tolerance;Decreased balance;Decreased knowledge of use of DME;Pain       PT Treatment Interventions DME instruction;Therapeutic activities;Gait training;Therapeutic exercise;Patient/family education;Balance training;Stair training;Functional mobility training    PT Goals (Current goals can be found in the Care Plan section)  Acute Rehab PT Goals Patient Stated Goal: go home PT Goal Formulation: With patient Time For Goal Achievement: 09/05/18 Potential to Achieve Goals: Good    Frequency     Barriers to discharge        Co-evaluation               AM-PAC PT "6 Clicks" Mobility  Outcome Measure Help needed turning from your back to your side while in a flat bed without using bedrails?: A Little Help needed moving from lying on your back to sitting on the side of a flat bed without using bedrails?: A Little Help needed moving to and from a bed to a chair (including a wheelchair)?: A Little Help needed standing up from a chair using your arms (e.g., wheelchair or bedside chair)?: A Little Help needed to walk in hospital room?: A Little Help needed climbing 3-5 steps with a railing? : A Lot 6 Click Score: 17    End of Session Equipment Utilized During Treatment: Gait belt Activity Tolerance: Patient limited by pain;Patient limited by fatigue(N/V) Patient left: in chair;with chair alarm set;with call bell/phone within reach;with SCD's reapplied Nurse Communication: Mobility  status PT Visit Diagnosis: Other abnormalities of gait and mobility (R26.89);Difficulty in walking, not elsewhere classified (R26.2)    Time: 8937-3428 PT Time Calculation (min) (ACUTE ONLY): 18 min   Charges:   PT Evaluation $PT Eval Low Complexity: 1 Low          Hoyle Barkdull Conception Chancy, PT Acute Rehabilitation Services Pager [phone]  Office [phone]   Roxine Caddy D Elonda Husky 08/29/2018, 5:49 PM

## 2018-08-29 NOTE — Anesthesia Procedure Notes (Signed)
Spinal  Patient location during procedure: OR Start time: 08/29/2018 7:26 AM End time: 08/29/2018 7:30 AM Staffing Anesthesiologist: Montez Hageman, MD Resident/CRNA: Montel Clock, CRNA Performed: resident/CRNA  Preanesthetic Checklist Completed: patient identified, site marked, surgical consent, pre-op evaluation, timeout performed, IV checked, risks and benefits discussed and monitors and equipment checked Spinal Block Patient position: sitting Prep: DuraPrep Patient monitoring: heart rate, cardiac monitor, continuous pulse ox and blood pressure Approach: midline Location: L3-4 Injection technique: single-shot Needle Needle type: Pencan  Needle gauge: 24 G Needle length: 10 cm Needle insertion depth: 9 cm Assessment Sensory level: T4 Additional Notes Timeout performed. Positive CSF, negative heme/parasthesia. Patient tolerated well. VSS.

## 2018-08-29 NOTE — Transfer of Care (Signed)
Immediate Anesthesia Transfer of Care Note  Patient: Isabella Oliver  Procedure(s) Performed: TOTAL KNEE ARTHROPLASTY (Right Knee)  Patient Location: PACU  Anesthesia Type:Spinal  Level of Consciousness: drowsy and patient cooperative  Airway & Oxygen Therapy: Patient Spontanous Breathing and Patient connected to face mask oxygen  Post-op Assessment: Report given to RN and Post -op Vital signs reviewed and stable  Post vital signs: Reviewed and stable  Last Vitals:  Vitals Value Taken Time  BP 146/55 08/29/18 0907  Temp    Pulse 91 08/29/18 0908  Resp 16 08/29/18 0908  SpO2 100 % 08/29/18 0908  Vitals shown include unvalidated device data.  Last Pain:  Vitals:   08/29/18 0600  TempSrc: Oral  PainSc:       Patients Stated Pain Goal: 4 (96/75/91 6384)  Complications: No apparent anesthesia complications

## 2018-08-29 NOTE — H&P (Signed)
TOTAL KNEE ADMISSION H&P  Patient is being admitted for right total knee arthroplasty.  Subjective:  Chief Complaint:right knee pain.  HPI: Isabella Oliver, 68 y.o. female, has a history of pain and functional disability in the right knee due to arthritis and has failed non-surgical conservative treatments for greater than 12 weeks to includecorticosteriod injections, viscosupplementation injections and activity modification.  Onset of symptoms was gradual, starting 8 years ago with gradually worsening course since that time. The patient noted no past surgery on the right knee(s).  Patient currently rates pain in the right knee(s) at 8 out of 10 with activity. Patient has night pain, worsening of pain with activity and weight bearing, pain that interferes with activities of daily living, pain with passive range of motion and joint swelling.  Patient has evidence of subchondral sclerosis, periarticular osteophytes, joint subluxation and joint space narrowing by imaging studies. This patient has had Failure of all reasonable conservative care. There is no active infection.  Patient Active Problem List   Diagnosis Date Noted  . Pre-operative cardiovascular examination 07/17/2018  . Mild aortic stenosis 07/17/2018  . Primary osteoarthritis of left knee 02/20/2016  . Arthritis 10/30/2013  . Type 2 diabetes mellitus without complication (HCC) 10/30/2013  . Essential hypertension 10/30/2013  . Hyperlipidemia 10/30/2013   Past Medical History:  Diagnosis Date  . Allergy   . Arthritis   . Asthma   . Diabetes mellitus without complication (HCC)   . GERD (gastroesophageal reflux disease)   . Heart murmur    [facility]  . History of carpal tunnel syndrome    Bilateral  . History of iron deficiency anemia   . Hyperlipidemia   . Hypertension   . Pneumonia     Past Surgical History:  Procedure Laterality Date  . BILATERAL CARPAL TUNNEL RELEASE    . COLONOSCOPY    .  TOTAL KNEE ARTHROPLASTY Left 02/20/2016   Procedure: TOTAL KNEE ARTHROPLASTY;  Surgeon: Jodi GeraldsJohn Iley Deignan, MD;  Location: [facility] OR;  Service: Orthopedics;  Laterality: Left;  . TUBAL LIGATION    . TUBAL LIGATION      Current Facility-Administered Medications  Medication Dose Route Frequency Provider Last Rate Last Dose  . bupivacaine liposome (EXPAREL) 1.3 % injection 266 mg  20 mL Other Once Jodi GeraldsGraves, Kassady Laboy, MD      . ceFAZolin (ANCEF) IVPB 2g/100 mL premix  2 g Intravenous On Call to OR Jodi GeraldsGraves, Harvard Zeiss, MD      . chlorhexidine (HIBICLENS) 4 % liquid 4 application  60 mL Topical Once Jodi GeraldsGraves, Yug Loria, MD      . lactated ringers infusion   Intravenous Continuous Shelton SilvasHollis, Kevin D, MD 50 mL/hr at 08/29/18 [phone]    . tranexamic acid (CYKLOKAPRON) IVPB 1,000 mg  1,000 mg Intravenous To OR Jodi GeraldsGraves, Cuauhtemoc Huegel, MD       Facility-Administered Medications Ordered in Other Encounters  Medication Dose Route Frequency Provider Last Rate Last Dose  . fentaNYL (SUBLIMAZE) injection    Anesthesia Intra-op Epimenio SarinJarvela, Joshua R, CRNA   50 mcg at 08/29/18 [id number]  . midazolam (VERSED) 5 MG/5ML injection    Anesthesia Intra-op Epimenio SarinJarvela, Joshua R, CRNA   1 mg at 08/29/18 [id number]  . ropivacaine (PF) 5 mg/mL (0.5%) (NAROPIN) injection    Anesthesia Intra-op Phillips Groutarignan, Peter, MD   30 mL at 08/29/18 0715   Allergies  Allergen Reactions  . Food Swelling and Other (See Comments)    CUCUMBERS TONGUE SWELLING  . Codeine Nausea Only  .  Methocarbamol Nausea Only  . Tramadol Nausea Only    Patient states she tolerates  . Voltaren [Diclofenac Sodium] Rash    Social History   Tobacco Use  . Smoking status: Former Smoker    Packs/day: 0.50    Years: 20.00    Pack years: 10.00    Types: Cigarettes    Quit date: 2001    Years since quitting: 19.6  . Smokeless tobacco: Never Used  Substance Use Topics  . Alcohol use: Not Currently    Comment: seldom    Family History  Problem Relation Age of Onset  . Lung disease Mother   . Hypertension Father    . Cancer Sister   . Diabetes Sister   . Cancer Maternal Grandmother   . Cancer Maternal Grandfather      ROS ROS: I have reviewed the patient's review of systems thoroughly and there are no positive responses as relates to the HPI. Objective:  Physical Exam  Vital signs in last 24 hours: Temp:  [98.2 F (36.8 C)] 98.2 F (36.8 C) (08/21 0600) Pulse Rate:  [87] 87 (08/21 0600) Resp:  [14] 14 (08/21 0600) BP: (163)/(75) 163/75 (08/21 0600) SpO2:  [99 %] 99 % (08/21 0600) Weight:  [91 kg] 91 kg (08/21 0541) Well-developed well-nourished patient in no acute distress. Alert and oriented x3 HEENT:within normal limits Cardiac: Regular rate and rhythm Pulmonary: Lungs clear to auscultation Abdomen: Soft and nontender.  Normal active bowel sounds  Musculoskeletal: (Right knee: Painful range of motion.  Limited range of motion.  No instability.  Trace effusion.  Mild varus malalignment. Labs: Recent Results (from the past 2160 hour(s))  APTT     Status: None   Collection Time: 08/22/18 10:22 AM  Result Value Ref Range   aPTT 28 24 - 36 seconds    Comment: Performed at [facility], [address] [place], [place] [address]  CBC WITH DIFFERENTIAL     Status: None   Collection Time: 08/22/18 10:22 AM  Result Value Ref Range   WBC 6.8 4.0 - 10.5 K/uL   RBC 4.33 3.87 - 5.11 MIL/uL   Hemoglobin 12.6 12.0 - 15.0 g/dL   HCT 19.139.7 47.836.0 - 29.546.0 %   MCV 91.7 80.0 - 100.0 fL   MCH 29.1 26.0 - 34.0 pg   MCHC 31.7 30.0 - 36.0 g/dL   RDW 62.114.6 30.811.5 - 65.715.5 %   Platelets 342 150 - 400 K/uL   nRBC 0.0 0.0 - 0.2 %   Neutrophils Relative % 54 %   Neutro Abs 3.7 1.7 - 7.7 K/uL   Lymphocytes Relative 30 %   Lymphs Abs 2.0 0.7 - 4.0 K/uL   Monocytes Relative 8 %   Monocytes Absolute 0.6 0.1 - 1.0 K/uL   Eosinophils Relative 6 %   Eosinophils Absolute 0.4 0.0 - 0.5 K/uL   Basophils Relative 1 %   Basophils Absolute 0.1 0.0 - 0.1 K/uL   Immature Granulocytes 1 %   Abs  Immature Granulocytes 0.06 0.00 - 0.07 K/uL    Comment: Performed at [facility], [address] [place], [place] [address]  Comprehensive metabolic panel     Status: Abnormal   Collection Time: 08/22/18 10:22 AM  Result Value Ref Range   Sodium 138 135 - 145 mmol/L   Potassium 3.6 3.5 - 5.1 mmol/L   Chloride 101 98 - 111 mmol/L   CO2 26 22 - 32 mmol/L   Glucose, Bld 130 (H)  70 - 99 mg/dL   BUN 22 8 - 23 mg/dL   Creatinine, Ser 1.03 (H) 0.44 - 1.00 mg/dL   Calcium 10.3 8.9 - 10.3 mg/dL   Total Protein 9.0 (H) 6.5 - 8.1 g/dL   Albumin 4.4 3.5 - 5.0 g/dL   AST 19 15 - 41 U/L   ALT 18 0 - 44 U/L   Alkaline Phosphatase 75 38 - 126 U/L   Total Bilirubin 0.4 0.3 - 1.2 mg/dL   GFR calc non Af Amer 55 (L) >60 mL/min   GFR calc Af Amer >60 >60 mL/min   Anion gap 11 5 - 15    Comment: Performed at [facility], [place] [address] [place], [place] [address]  Protime-INR     Status: None   Collection Time: 08/22/18 10:22 AM  Result Value Ref Range   Prothrombin Time 12.2 11.4 - 15.2 seconds   INR 0.9 0.8 - 1.2    Comment: (NOTE) INR goal varies based on device and disease states. Performed at [facility], [place] [address], [place], [place] [address]   Urinalysis, Routine w reflex microscopic     Status: Abnormal   Collection Time: 08/22/18 10:22 AM  Result Value Ref Range   Color, Urine STRAW (A) YELLOW   APPearance CLEAR CLEAR   Specific Gravity, Urine 1.014 1.005 - 1.030   pH 5.0 5.0 - 8.0   Glucose, UA >=500 (A) NEGATIVE mg/dL   Hgb urine dipstick NEGATIVE NEGATIVE   Bilirubin Urine NEGATIVE NEGATIVE   Ketones, ur NEGATIVE NEGATIVE mg/dL   Protein, ur NEGATIVE NEGATIVE mg/dL   Nitrite NEGATIVE NEGATIVE   Leukocytes,Ua NEGATIVE NEGATIVE   RBC / HPF 0-5 0 - 5 RBC/hpf   WBC, UA 0-5 0 - 5 WBC/hpf   Bacteria, UA NONE SEEN NONE SEEN   Squamous Epithelial / LPF 0-5 0 - 5    Comment: Performed at [facility],  [place] [address] [place], [place] [address]  Surgical pcr screen     Status: Abnormal   Collection Time: 08/22/18 10:24 AM   Specimen: Nasal Mucosa; Nasal Swab  Result Value Ref Range   MRSA, PCR NEGATIVE NEGATIVE   Staphylococcus aureus POSITIVE (A) NEGATIVE    Comment: (NOTE) The Xpert SA Assay (FDA approved for NASAL specimens in patients 53 years of age and older), is one component of a comprehensive surveillance program. It is not intended to diagnose infection nor to guide or monitor treatment. Performed at [facility], [place] [address] [place], [place] [address]   Hemoglobin A1c     Status: Abnormal   Collection Time: 08/22/18 11:00 AM  Result Value Ref Range   Hgb A1c MFr Bld 6.7 (H) 4.8 - 5.6 %    Comment: (NOTE) Pre diabetes:          5.7%-6.4% Diabetes:              >6.4% Glycemic control for   <7.0% adults with diabetes    Mean Plasma Glucose 145.59 mg/dL    Comment: Performed at [place] [address], [place], [place] [address]  Glucose, capillary     Status: Abnormal   Collection Time: 08/22/18 11:13 AM  Result Value Ref Range   Glucose-Capillary 116 (H) 70 - 99 mg/dL  SARS CORONAVIRUS 2 Nasal Swab Aptima Multi Swab     Status: None   Collection Time: 08/26/18  2:02 PM   Specimen: Aptima Multi Swab;  Nasal Swab  Result Value Ref Range   SARS Coronavirus 2 NEGATIVE NEGATIVE    Comment: (NOTE) SARS-CoV-2 target nucleic acids are NOT DETECTED. The SARS-CoV-2 RNA is generally detectable in upper and lower respiratory specimens during the acute phase of infection. Negative results do not preclude SARS-CoV-2 infection, do not rule out co-infections with other pathogens, and should not be used as the sole basis for treatment or other patient management decisions. Negative results must be combined with clinical observations, patient history, and epidemiological information. The expected result is Negative. Fact Sheet for  Patients: HairSlick.nohttps://www.fda.gov/media/138098/download Fact Sheet for Healthcare Providers: quierodirigir.comhttps://www.fda.gov/media/138095/download This test is not yet approved or cleared by the [place] FDA and  has been authorized for detection and/or diagnosis of SARS-CoV-2 by FDA under an Emergency Use Authorization (EUA). This EUA will remain  in effect (meaning this test can be used) for the duration of the COVID-19 declaration under Section 56 4(b)(1) of the Act, 21 U.S.C. section 360bbb-3(b)(1), unless the authorization is terminated or revoked sooner. Performed at [facility], [address] [place], [place] [address]   Glucose, capillary     Status: Abnormal   Collection Time: 08/29/18  5:44 AM  Result Value Ref Range   Glucose-Capillary 140 (H) 70 - 99 mg/dL   Comment 1 Notify RN    Comment 2 Document in Chart      Estimated body mass index is 34.98 kg/m as calculated from the following:   Height as of this encounter: 5' 3.5" (1.613 m).   Weight as of this encounter: 91 kg.   Imaging Review Plain radiographs demonstrate severe degenerative joint disease of the right knee(s). The overall alignment ismild varus. The bone quality appears to be fair for age and reported activity level.      Assessment/Plan:  End stage arthritis, right knee   The patient history, physical examination, clinical judgment of the provider and imaging studies are consistent with end stage degenerative joint disease of the right knee(s) and total knee arthroplasty is deemed medically necessary. The treatment options including medical management, injection therapy arthroscopy and arthroplasty were discussed at length. The risks and benefits of total knee arthroplasty were presented and reviewed. The risks due to aseptic loosening, infection, stiffness, patella tracking problems, thromboembolic complications and other imponderables were discussed. The patient acknowledged the explanation, agreed  to proceed with the plan and consent was signed. Patient is being admitted for inpatient treatment for surgery, pain control, PT, OT, prophylactic antibiotics, VTE prophylaxis, progressive ambulation and ADL's and discharge planning. The patient is planning to be discharged home with home health services     Patient's anticipated LOS is less than 2 midnights, meeting these requirements: - Younger than 2265 - Lives within 1 hour of care - Has a competent adult at home to recover with post-op recover - NO history of  - Chronic pain requiring opiods  - Diabetes  - Coronary Artery Disease  - Heart failure  - Heart attack  - Stroke  - DVT/VTE  - Cardiac arrhythmia  - Respiratory Failure/COPD  - Renal failure  - Anemia  - Advanced Liver disease

## 2018-08-29 NOTE — Discharge Instructions (Signed)

## 2018-08-29 NOTE — Anesthesia Postprocedure Evaluation (Signed)
Anesthesia Post Note  Patient: Isabella Oliver  Procedure(s) Performed: TOTAL KNEE ARTHROPLASTY (Right Knee)     Patient location during evaluation: PACU Anesthesia Type: Spinal Level of consciousness: awake and alert Pain management: pain level controlled Vital Signs Assessment: post-procedure vital signs reviewed and stable Respiratory status: spontaneous breathing and respiratory function stable Cardiovascular status: blood pressure returned to baseline and stable Postop Assessment: no headache, no backache, spinal receding and no apparent nausea or vomiting Anesthetic complications: no    Last Vitals:  Vitals:   08/29/18 1135 08/29/18 1203  BP: (!) 177/71 (!) 202/84  Pulse: 96 98  Resp: 15 16  Temp: 36.6 C 36.4 C  SpO2: 100% 100%    Last Pain:  Vitals:   08/29/18 1203  TempSrc: Oral  PainSc:                  Montez Hageman

## 2018-08-30 DIAGNOSIS — M1711 Unilateral primary osteoarthritis, right knee: Secondary | ICD-10-CM | POA: Diagnosis not present

## 2018-08-30 LAB — BASIC METABOLIC PANEL
Anion gap: 15 (ref 5–15)
BUN: 9 mg/dL (ref 8–23)
CO2: 22 mmol/L (ref 22–32)
Calcium: 9.1 mg/dL (ref 8.9–10.3)
Chloride: 98 mmol/L (ref 98–111)
Creatinine, Ser: 0.79 mg/dL (ref 0.44–1.00)
GFR calc Af Amer: >60 mL/min (ref >60)
GFR calc non Af Amer: >60 mL/min (ref >60)
Glucose, Bld: 140 mg/dL — ABNORMAL HIGH (ref 70–99)
Potassium: 3.3 mmol/L — ABNORMAL LOW (ref 3.5–5.1)
Sodium: 135 mmol/L (ref 135–145)

## 2018-08-30 LAB — CBC
HCT: 36.3 % (ref 36.0–46.0)
Hemoglobin: 12 g/dL (ref 12.0–15.0)
MCH: 29.6 pg (ref 26.0–34.0)
MCHC: 33.1 g/dL (ref 30.0–36.0)
MCV: 89.4 fL (ref 80.0–100.0)
Platelets: 302 10*3/uL (ref 150–400)
RBC: 4.06 MIL/uL (ref 3.87–5.11)
RDW: 13.9 % (ref 11.5–15.5)
WBC: 10 10*3/uL (ref 4.0–10.5)
nRBC: 0 % (ref 0.0–0.2)

## 2018-08-30 LAB — GLUCOSE, CAPILLARY
Glucose-Capillary: 160 mg/dL — ABNORMAL HIGH (ref 70–99)
Glucose-Capillary: 183 mg/dL — ABNORMAL HIGH (ref 70–99)
Glucose-Capillary: 163 mg/dL — ABNORMAL HIGH (ref 70–99)
Glucose-Capillary: 141 mg/dL — ABNORMAL HIGH (ref 70–99)

## 2018-08-30 LAB — MAGNESIUM: Magnesium: 1.4 mg/dL — ABNORMAL LOW (ref 1.7–2.4)

## 2018-08-30 MED ORDER — CHLORTHALIDONE 25 MG PO TABS
50.0000 mg | ORAL_TABLET | Freq: Once | ORAL | Status: AC
  Administered 2018-08-30: 50 mg via ORAL
  Filled 2018-08-30: qty 2

## 2018-08-30 MED ORDER — METOPROLOL SUCCINATE ER 50 MG PO TB24
50.0000 mg | ORAL_TABLET | Freq: Every day | ORAL | Status: DC
  Administered 2018-08-30 – 2018-08-31 (×2): 50 mg via ORAL
  Filled 2018-08-30 (×2): qty 1

## 2018-08-30 MED ORDER — CHLORTHALIDONE 25 MG PO TABS: 50.0000 mg | ORAL_TABLET | Freq: Every day | ORAL | Status: DC

## 2018-08-30 MED ORDER — SENNOSIDES-DOCUSATE SODIUM 8.6-50 MG PO TABS
2.0000 | ORAL_TABLET | Freq: Two times a day (BID) | ORAL | Status: DC
  Administered 2018-08-30 – 2018-08-31 (×3): 2 via ORAL
  Filled 2018-08-30 (×3): qty 2

## 2018-08-30 MED ORDER — CHLORTHALIDONE 25 MG PO TABS
100.0000 mg | ORAL_TABLET | Freq: Every day | ORAL | Status: DC
  Administered 2018-08-31: 100 mg via ORAL
  Filled 2018-08-30: qty 4

## 2018-08-30 NOTE — TOC Initial Note (Signed)
Transition of Care [facility]) - Initial/Assessment Note    Patient Details  Name: Isabella Oliver MRN: [id number] Date of Birth: 12-Jul-1950  [facility]) CM/SW Contact:    Joaquin Courts, RN Phone Number: 08/30/2018, 12:19 PM  Clinical Narrative:    CM spoke with patient at bedside. Patient [facility] up with [facility] for [place]. Reports has rolling walker and 3-in-1.               Expected Discharge Plan: [place] Barriers to Discharge: No Barriers Identified   Patient Goals and CMS Choice Patient states their goals for this hospitalization and ongoing recovery are:: to go home CMS Medicare.gov Compare Post Acute Care list provided to:: Patient Choice offered to / list presented to : Patient  Expected Discharge Plan and Services Expected Discharge Plan: [place]   Discharge Planning Services: CM Consult Post Acute Care Choice: [place] arrangements for the past 2 months: Apartment Expected Discharge Date: 08/30/18               DME Arranged: N/A DME Agency: NA       HH Arranged: PT [place] Agency: [facility] [employer]) Date [place]: 08/30/18 Time [place]: 1219 Representative spoke with at [place]: Alwyn Ren  Prior Living Arrangements/Services Living arrangements for the past 2 months: [place] with:: Adult Children Patient language and need for interpreter reviewed:: Yes Do you feel safe going back to the place where you live?: Yes      Need for Family Participation in Patient Care: Yes (Comment) Care giver support system in place?: Yes (comment)   Criminal Activity/Legal Involvement Pertinent to Current Situation/Hospitalization: No - Comment as needed  Activities of Daily Living Home Assistive Devices/Equipment: Eyeglasses, Blood pressure cuff, CBG Meter, Cane (specify quad or straight), Walker (specify type), Bedside commode/3-in-1 ADL Screening  (condition at time of admission) Patient's cognitive ability adequate to safely complete daily activities?: Yes Is the patient deaf or have difficulty hearing?: No Does the patient have difficulty seeing, even when wearing glasses/contacts?: No Does the patient have difficulty concentrating, remembering, or making decisions?: No Patient able to express need for assistance with ADLs?: Yes Does the patient have difficulty dressing or bathing?: No Independently performs ADLs?: Yes (appropriate for developmental age) Does the patient have difficulty walking or climbing stairs?: Yes Weakness of Legs: None Weakness of Arms/Hands: None  Permission Sought/Granted                  Emotional Assessment Appearance:: Appears stated age Attitude/Demeanor/Rapport: Engaged Affect (typically observed): Accepting Orientation: : Oriented to Place, Oriented to  Time, Oriented to Situation, Oriented to Self   Psych Involvement: No (comment)  Admission diagnosis:  [place] DISEASE RIGHT KNEE Patient Active Problem List   Diagnosis Date Noted  . Primary osteoarthritis of right knee 08/29/2018  . Pre-operative cardiovascular examination 07/17/2018  . Mild aortic stenosis 07/17/2018  . Primary osteoarthritis of left knee 02/20/2016  . Arthritis 10/30/2013  . Type 2 diabetes mellitus without complication ([place]) 31/51/7616  . Essential hypertension 10/30/2013  . Hyperlipidemia 10/30/2013   PCP:  Lavone Orn, MD Pharmacy:   [id number] - [place], [place] [address] [place] [address] Phone: [phone] Fax: [phone]     Social Determinants of Health (SDOH) Interventions    Readmission Risk Interventions No flowsheet data  found.  

## 2018-08-30 NOTE — Progress Notes (Signed)
Physical Therapy Treatment Patient Details Name: Isabella BourgeoisZenobia F Oliver MRN: [id number] DOB: Feb 11, 1950 Today's Date: 08/30/2018    History of Present Illness 68 yo female s/p R TKR on 08/29/18. PMH includes aortic stenosis, OA, DM, HTN, HLD, L TKR.    PT Comments    Pt progressing, gait distance limited by PT as pt with elevated BP and RN awaiting MD orders. Pt tolerated HEP/TKA exercises well. Continue PT POC, not ready for d/c today  Follow Up Recommendations  Follow surgeon's recommendation for DC plan and follow-up therapies;Supervision for mobility/OOB(HHPT)     Equipment Recommendations  None recommended by PT    Recommendations for Other Services       Precautions / Restrictions Precautions Precautions: Fall Restrictions Weight Bearing Restrictions: No Other Position/Activity Restrictions: WBAT    Mobility  Bed Mobility Overal bed mobility: Needs Assistance Bed Mobility: Supine to Sit;Sit to Supine     Supine to sit: Min assist Sit to supine: Min guard   General bed mobility comments: min assist to bring RLE off bed, min guard to lift onto bed while pt self assisting RLE with  LLE   Transfers Overall transfer level: Needs assistance Equipment used: Rolling walker (2 wheeled) Transfers: Sit to/from Stand Sit to Stand: Min guard         General transfer comment: Min guard for safety, verbal cuing for hand placement.  Ambulation/Gait Ambulation/Gait assistance: Min guard Gait Distance (Feet): 12 Feet(x2) Assistive device: Rolling walker (2 wheeled) Gait Pattern/deviations: Step-to pattern;Decreased step length - right;Decreased step length - left;Antalgic;Decreased weight shift to right;Decreased stance time - right Gait velocity: deferred iincr gait this pm d/t elevated BPs, RN waiting on MD orders for  meds    General Gait Details: Min assist for steadying, guarding of RLE due to RLE weakness. Verbal cuing for sequencing, placement in RW. pt limited by  fatigue   Stairs             Wheelchair Mobility    Modified Rankin (Stroke Patients Only)       Balance                                            Cognition Arousal/Alertness: Awake/alert Behavior During Therapy: WFL for tasks assessed/performed Overall Cognitive Status: Within Functional Limits for tasks assessed                                        Exercises Total Joint Exercises Ankle Circles/Pumps: AROM;10 reps;Both Quad Sets: AROM;Both;10 reps Heel Slides: AROM;AAROM;Right;10 reps Hip ABduction/ADduction: AAROM;Right;10 reps Straight Leg Raises: AAROM;Right;10 reps    General Comments        Pertinent Vitals/Pain Pain Assessment: 0-10 Pain Score: 3  Pain Location: R knee Pain Descriptors / Indicators: Sore Pain Intervention(s): Limited activity within patient's tolerance;Monitored during session;Premedicated before session    Home Living                      Prior Function            PT Goals (current goals can now be found in the care plan section) Acute Rehab PT Goals Patient Stated Goal: go home PT Goal Formulation: With patient Time For Goal Achievement: 09/05/18 Potential to Achieve Goals: Good Progress towards PT goals:  Progressing toward goals    Frequency    7X/week      PT Plan Current plan remains appropriate    Co-evaluation              AM-PAC PT "6 Clicks" Mobility   Outcome Measure  Help needed turning from your back to your side while in a flat bed without using bedrails?: A Little Help needed moving from lying on your back to sitting on the side of a flat bed without using bedrails?: A Little Help needed moving to and from a bed to a chair (including a wheelchair)?: A Little Help needed standing up from a chair using your arms (e.g., wheelchair or bedside chair)?: A Little Help needed to walk in hospital room?: A Little Help needed climbing 3-5 steps with a  railing? : A Little 6 Click Score: 18    End of Session Equipment Utilized During Treatment: Gait belt Activity Tolerance: Patient tolerated treatment well;Patient limited by fatigue Patient left: with call bell/phone within reach;in bed;with bed alarm set Nurse Communication: Mobility status PT Visit Diagnosis: Other abnormalities of gait and mobility (R26.89);Difficulty in walking, not elsewhere classified (R26.2)     Time: 2585-2778 PT Time Calculation (min) (ACUTE ONLY): 26 min  Charges:  $Gait Training: 8-22 mins $Therapeutic Exercise: 8-22 mins                     Kenyon Ana, PT  Pager: [phone] Acute Rehab Dept [facility]): [phone]   08/30/2018    [facility] 08/30/2018, 3:42 PM

## 2018-08-30 NOTE — Discharge Summary (Addendum)
Patient ID: Isabella Oliver MRN: [id number] DOB/AGE: 1950-12-22 68 y.o.  Admit date: 08/29/2018 Discharge date: 08/31/2018  Admission Diagnoses:  Principal Problem:   Primary osteoarthritis of right knee   Discharge Diagnoses:  Same  Past Medical History:  Diagnosis Date  . Allergy   . Arthritis   . Asthma   . Diabetes mellitus without complication (HCC)   . GERD (gastroesophageal reflux disease)   . Heart murmur    [facility]  . History of carpal tunnel syndrome    Bilateral  . History of iron deficiency anemia   . Hyperlipidemia   . Hypertension   . Pneumonia     Surgeries: Procedure(s): TOTAL KNEE ARTHROPLASTY on 08/29/2018   Consultants: Treatment Team:  [facility], [facility], MD  Discharged Condition: Improved  Hospital Course: Isabella Oliver is an 68 y.o. female who was admitted 08/29/2018 for operative treatment ofPrimary osteoarthritis of right knee. Patient has severe unremitting pain that affects sleep, daily activities, and work/hobbies. After pre-op clearance the patient was taken to the operating room on 08/29/2018 and underwent  Procedure(s): TOTAL KNEE ARTHROPLASTY.    Patient was given perioperative antibiotics:  Anti-infectives (From admission, onward)   Start     Dose/Rate Route Frequency Ordered Stop   08/29/18 1400  ceFAZolin (ANCEF) IVPB 2g/100 mL premix     2 g 200 mL/hr over 30 Minutes Intravenous Every 6 hours 08/29/18 1034 08/29/18 2011   08/29/18 0600  ceFAZolin (ANCEF) IVPB 2g/100 mL premix     2 g 200 mL/hr over 30 Minutes Intravenous On call to O.R. 08/29/18 [id number] 08/29/18 0741       Patient was given sequential compression devices, early ambulation, and chemoprophylaxis to prevent DVT.  Patient benefited maximally from hospital stay and there were no complications.    Recent vital signs:  Patient Vitals for the past 24 hrs:  BP Temp Temp src Pulse Resp SpO2  08/31/18 0337 (!) 164/79 98.5 F (36.9 C) Oral 89  18 97 %  08/31/18 0054 (!) 161/78 98.5 F (36.9 C) Oral 93 18 97 %  08/30/18 2102 (!) 175/87 98.5 F (36.9 C) Oral 88 16 99 %  08/30/18 1758 (!) 170/78 - - 86 - -  08/30/18 1542 (!) 180/83 - - 89 18 98 %  08/30/18 1433 (!) 183/84 97.8 F (36.6 C) Oral 85 16 94 %  08/30/18 1047 (!) 197/83 98.8 F (37.1 C) Oral 95 16 95 %  08/30/18 1040 (!) 212/86 98.8 F (37.1 C) Oral 93 16 94 %     Recent laboratory studies:  Recent Labs    08/30/18 0252 08/30/18 1227 08/31/18 0328  WBC 10.0  --  13.7*  HGB 12.0  --  12.3  HCT 36.3  --  37.4  PLT 302  --  342  NA  --  135  --   K  --  3.3*  --   CL  --  98  --   CO2  --  22  --   BUN  --  9  --   CREATININE  --  0.79  --   GLUCOSE  --  140*  --   CALCIUM  --  9.1  --      Discharge Medications:   Allergies as of 08/31/2018      Reactions   Food Swelling, Other (See Comments)   CUCUMBERS TONGUE SWELLING   Codeine Nausea Only   Methocarbamol Nausea Only   Tramadol  Nausea Only   Patient states she tolerates   Voltaren [diclofenac Sodium] Rash      Medication List    TAKE these medications   acetaminophen 500 MG tablet Commonly known as: TYLENOL Take 1,000 mg by mouth every 6 (six) hours as needed for moderate pain.   albuterol 108 (90 Base) MCG/ACT inhaler Commonly known as: VENTOLIN HFA Inhale 1-2 puffs into the lungs every 6 (six) hours as needed for wheezing or shortness of breath.   amLODipine 10 MG tablet Commonly known as: NORVASC Take 10 mg by mouth daily.   aspirin EC 325 MG tablet Take 1 tablet (325 mg total) by mouth 2 (two) times daily after a meal. Take x 1 month post op to decrease risk of blood clots. What changed:   medication strength  how much to take  when to take this  additional instructions   betamethasone dipropionate 0.05 % lotion Apply 1 application topically daily as needed (scalp irritation).   chlorthalidone 50 MG tablet Commonly known as: HYGROTON Take 2 tablets (100 mg total) by  mouth daily. Start taking on: September 01, 2018 What changed:   medication strength  how much to take   Ciclopirox 1 % shampoo Apply 1 application topically 2 (two) times a week.   empagliflozin 25 MG Tabs tablet Commonly known as: JARDIANCE Take 25 mg by mouth daily.   ezetimibe 10 MG tablet Commonly known as: ZETIA Take 10 mg by mouth daily.   glimepiride 4 MG tablet Commonly known as: AMARYL Take 2 mg by mouth daily with breakfast.   losartan 100 MG tablet Commonly known as: COZAAR Take 100 mg by mouth daily.   oxyCODONE-acetaminophen 5-325 MG tablet Commonly known as: PERCOCET/ROXICET Take 1-2 tablets by mouth every 6 (six) hours as needed for severe pain.   pantoprazole 40 MG tablet Commonly known as: PROTONIX Take 40 mg by mouth daily.   potassium chloride 10 MEQ CR capsule Commonly known as: MICRO-K Take 10 mEq by mouth daily.   senna-docusate 8.6-50 MG tablet Commonly known as: Senokot-S Take 2 tablets by mouth 2 (two) times daily.   tiZANidine 2 MG tablet Commonly known as: ZANAFLEX Take 1 tablet (2 mg total) by mouth every 8 (eight) hours as needed for muscle spasms.   Vitamin D3 50 MCG (2000 UT) Tabs Take 2,000 Units by mouth daily.            Discharge Care Instructions  (From admission, onward)         Start     Ordered   08/30/18 0000  Weight bearing as tolerated     08/30/18 0850          Diagnostic Studies: Dg Chest 2 View  Result Date: 08/22/2018 CLINICAL DATA:  Preop evaluation for upcoming knee surgery EXAM: CHEST - 2 VIEW COMPARISON:  02/15/2016 FINDINGS: Cardiac shadow is stable. The lungs are well aerated bilaterally. No focal infiltrate or sizable effusion is seen. Degenerative changes of the thoracic spine are noted. IMPRESSION: No acute abnormality noted. Electronically Signed   By: Inez Catalina M.D.   On: 08/22/2018 16:11    Disposition: Discharge disposition: 01-Home or Self Care       Discharge Instructions     Call MD / Call 911   Complete by: As directed    If you experience chest pain or shortness of breath, CALL 911 and be transported to the hospital emergency room.  If you develope a fever above 101 F, pus (white  drainage) or increased drainage or redness at the wound, or calf pain, call your surgeon's office.   Constipation Prevention   Complete by: As directed    Drink plenty of fluids.  Prune juice may be helpful.  You may use a stool softener, such as Colace (over the counter) 100 mg twice a day.  Use MiraLax (over the counter) for constipation as needed.   Diet - low sodium heart healthy   Complete by: As directed    Driving restrictions   Complete by: As directed    No driving for 2 weeks   Increase activity slowly as tolerated   Complete by: As directed    Patient may shower   Complete by: As directed    You may shower without a dressing once there is no drainage.  Do not wash over the wound.  If drainage remains, cover wound with plastic wrap and then shower.   Weight bearing as tolerated   Complete by: As directed       Follow-up Information    Jodi GeraldsGraves, John, MD. Schedule an appointment as soon as possible for a visit in 2 weeks.   Specialty: Orthopedic Surgery Contact information: [address] [place] [address] [phone]        [facility] Follow up.   Specialty: Home Health Services Why: agency will provide home health physical therapy, agency will call you to schedule first visit. Contact information: [address] [place] [address] [phone]        Kirby FunkGriffin, John, MD. Schedule an appointment as soon as possible for a visit in 1 week(s).   Specialty: Internal Medicine Why: f/u in 1-2 weeks. Contact information: [address] [place] [address] [phone]            Signed: Dannielle Burnric Kyri Dai 08/31/2018, 8:58 AM

## 2018-08-30 NOTE — Consult Note (Signed)
Medical Consultation   Isabella Oliver  ZJQ:[id number]  DOB: 1951/01/07  DOA: 08/29/2018  PCP: Lavone Orn, MD   Outpatient Specialists: Ortho   Requesting physician: Joanell Rising, [facility] Orthoped[facility]  Reason for consultation: Uncontrolled hypertension.   History of Present Illness: 68 year old female with history of type 2 diabetes, asthma, GERD, morbid obesity, hypertension, right knee osteoarthritis status post total knee arthroplasty on 08/29/2018.  Primary team orthopedic surgery consulted hospitalist team due to uncontrolled hypertension.  Pain is fairly well controlled so unlikely to contribute to her uncontrolled hypertension.  She has no new symptoms.  She denies any cardiopulmonary symptoms.  A little constipated, last bowel movement was 3 days ago.  No nausea or abdominal pain.    She was receiving normal saline infusion at 100 cc/h, discontinued this morning.  Chlorthalidone increased from 25 mg daily to 50 mg daily.  Obtain BMP and magnesium level as add on in the setting of diuretics to monitor renal function and electrolytes.   Review of Systems:  ROS As per HPI otherwise 10 point review of systems negative.    Past Medical History: Past Medical History:  Diagnosis Date   Allergy    Arthritis    Asthma    Diabetes mellitus without complication (HCC)    GERD (gastroesophageal reflux disease)    Heart murmur    Grade I/VI 12/2013 at [facility] office   History of carpal tunnel syndrome    Bilateral   History of iron deficiency anemia    Hyperlipidemia    Hypertension    Pneumonia     Past Surgical History: Past Surgical History:  Procedure Laterality Date   BILATERAL CARPAL TUNNEL RELEASE     COLONOSCOPY     TOTAL KNEE ARTHROPLASTY Left 02/20/2016   Procedure: TOTAL KNEE ARTHROPLASTY;  Surgeon: Dorna Leitz, MD;  Location: [place];  Service: Orthopedics;  Laterality: Left;   TUBAL LIGATION     TUBAL LIGATION        Allergies:   Allergies  Allergen Reactions   Food Swelling and Other (See Comments)    CUCUMBERS TONGUE SWELLING   Codeine Nausea Only   Methocarbamol Nausea Only   Tramadol Nausea Only    Patient states she tolerates   Voltaren [Diclofenac Sodium] Rash     Social History:  reports that she quit smoking about 19 years ago. Her smoking use included cigarettes. She has a 10.00 pack-year smoking history. She has never used smokeless tobacco. She reports previous alcohol use. She reports that she does not use drugs.   Family History: Family History  Problem Relation Age of Onset   Lung disease Mother    Hypertension Father    Cancer Sister    Diabetes Sister    Cancer Maternal Grandmother    Cancer Maternal Grandfather      Physical Exam: Vitals:   08/30/18 0159 08/30/18 0541 08/30/18 1040 08/30/18 1047  BP: (!) 186/81 (!) 178/83 (!) 212/86 (!) 197/83  Pulse: (!) 103 (!) 105 93 95  Resp: 18 18 16 16   Temp: 99 F (37.2 C) 98.9 F (37.2 C) 98.8 F (37.1 C) 98.8 F (37.1 C)  TempSrc: Oral Oral Oral Oral  SpO2: 92% 98% 94% 95%  Weight:      Height:        Constitutional: Alert and awake, oriented x3, not in any acute distress. Eyes: PERLA, EOMI, irises appear normal, anicteric sclera,  ENMT: external ears and nose appear normal            Lips appears normal, oropharynx mucosa, tongue, posterior pharynx appear normal  Neck: neck appears normal, no masses, normal ROM, no thyromegaly, no JVD  CVS: S1-S2 clear, no murmur rubs or gallops, no LE edema, normal pedal pulses  Respiratory:  clear to auscultation bilaterally, no wheezing, rales or rhonchi. Respiratory effort normal. No accessory muscle use.  Abdomen: soft nontender, nondistended, normal bowel sounds, no hepatosplenomegaly, no hernias  Musculoskeletal: : no cyanosis, clubbing noted bilaterally.  Right knee in surgical wrap, ice bag on it. Neuro: Cranial nerves II-XII intact, strength,  sensation, reflexes Psych: judgement and insight appear normal, stable mood and affect, mental status Skin: no rashes or lesions or ulcers, no induration or nodules   Data reviewed:  I have personally reviewed following labs and imaging studies Labs:  CBC: Recent Labs  Lab 08/30/18 0252  WBC 10.0  HGB 12.0  HCT 36.3  MCV 89.4  PLT 302    Basic Metabolic Panel: No results for input(s): NA, K, CL, CO2, GLUCOSE, BUN, CREATININE, CALCIUM, MG, PHOS in the last 168 hours. GFR Estimated Creatinine Clearance: 55.8 mL/min (A) (by C-G formula based on SCr of 1.03 mg/dL (H)). Liver Function Tests: No results for input(s): AST, ALT, ALKPHOS, BILITOT, PROT, ALBUMIN in the last 168 hours. No results for input(s): LIPASE, AMYLASE in the last 168 hours. No results for input(s): AMMONIA in the last 168 hours. Coagulation profile No results for input(s): INR, PROTIME in the last 168 hours.  Cardiac Enzymes: No results for input(s): CKTOTAL, CKMB, CKMBINDEX, TROPONINI in the last 168 hours. BNP: Invalid input(s): POCBNP CBG: Recent Labs  Lab 08/29/18 0911 08/29/18 1743 08/29/18 2147 08/30/18 0742 08/30/18 1055  GLUCAP 121* 194* 161* 160* 183*   D-Dimer No results for input(s): DDIMER in the last 72 hours. Hgb A1c No results for input(s): HGBA1C in the last 72 hours. Lipid Profile No results for input(s): CHOL, HDL, LDLCALC, TRIG, CHOLHDL, LDLDIRECT in the last 72 hours. Thyroid function studies No results for input(s): TSH, T4TOTAL, T3FREE, THYROIDAB in the last 72 hours.  Invalid input(s): FREET3 Anemia work up No results for input(s): VITAMINB12, FOLATE, FERRITIN, TIBC, IRON, RETICCTPCT in the last 72 hours. Urinalysis    Component Value Date/Time   COLORURINE STRAW (A) 08/22/2018 1022   APPEARANCEUR CLEAR 08/22/2018 1022   LABSPEC 1.014 08/22/2018 1022   PHURINE 5.0 08/22/2018 1022   GLUCOSEU >=500 (A) 08/22/2018 1022   HGBUR NEGATIVE 08/22/2018 1022   BILIRUBINUR  NEGATIVE 08/22/2018 1022   KETONESUR NEGATIVE 08/22/2018 1022   PROTEINUR NEGATIVE 08/22/2018 1022   UROBILINOGEN 0.2 12/02/2006 1023   NITRITE NEGATIVE 08/22/2018 1022   LEUKOCYTESUR NEGATIVE 08/22/2018 1022     Microbiology Recent Results (from the past 240 hour(s))  Surgical pcr screen     Status: Abnormal   Collection Time: 08/22/18 10:24 AM   Specimen: Nasal Mucosa; Nasal Swab  Result Value Ref Range Status   MRSA, PCR NEGATIVE NEGATIVE Final   Staphylococcus aureus POSITIVE (A) NEGATIVE Final    Comment: (NOTE) The Xpert SA Assay (FDA approved for NASAL specimens in patients 70 years of age and older), is one component of a comprehensive surveillance program. It is not intended to diagnose infection nor to guide or monitor treatment. Performed at [facility], [address] [place], [place] [address]   SARS CORONAVIRUS 2 Nasal Swab Aptima Multi Swab  Status: None   Collection Time: 08/26/18  2:02 PM   Specimen: Aptima Multi Swab; Nasal Swab  Result Value Ref Range Status   SARS Coronavirus 2 NEGATIVE NEGATIVE Final    Comment: (NOTE) SARS-CoV-2 target nucleic acids are NOT DETECTED. The SARS-CoV-2 RNA is generally detectable in upper and lower respiratory specimens during the acute phase of infection. Negative results do not preclude SARS-CoV-2 infection, do not rule out co-infections with other pathogens, and should not be used as the sole basis for treatment or other patient management decisions. Negative results must be combined with clinical observations, patient history, and epidemiological information. The expected result is Negative. Fact Sheet for Patients: HairSlick.nohttps://www.fda.gov/media/138098/download Fact Sheet for Healthcare Providers: quierodirigir.comhttps://www.fda.gov/media/138095/download This test is not yet approved or cleared by the [place] FDA and  has been authorized for detection and/or diagnosis of SARS-CoV-2 by FDA under an  Emergency Use Authorization (EUA). This EUA will remain  in effect (meaning this test can be used) for the duration of the COVID-19 declaration under Section 56 4(b)(1) of the Act, 21 U.S.C. section 360bbb-3(b)(1), unless the authorization is terminated or revoked sooner. Performed at [facility], [address] [place], [place] [address]        Inpatient Medications:   Scheduled Meds:  amLODipine  10 mg Oral Daily   aspirin EC  325 mg Oral BID PC   canagliflozin  100 mg Oral QAC breakfast   [START ON 08/31/2018] chlorthalidone  50 mg Oral Daily   dexamethasone (DECADRON) injection  10 mg Intravenous Q12H   docusate sodium  100 mg Oral BID   gabapentin  300 mg Oral BID   glimepiride  2 mg Oral Q breakfast   insulin aspart  0-15 Units Subcutaneous TID WC   losartan  100 mg Oral Daily   metoprolol succinate  50 mg Oral Daily   pantoprazole  40 mg Oral Daily   potassium chloride  10 mEq Oral Daily   Continuous Infusions:   Radiological Exams on Admission: No results found.  Impression/Recommendations Principal Problem:   Primary osteoarthritis of right knee  Uncontrolled hypertension DC IV fluid normal saline which can contribute to elevated blood pressure Continue amlodipine 10 mg daily Continue losartan 100 mg daily Increase dose of chlorthalidone from 25 mg daily to 50 mg daily Obtain BMP and magnesium level as add on lab to monitor renal function and electrolytes in the setting of diuretics  Obesity BMI 34 Recommend weight loss outpatient with regular physical activity and healthy dieting, this will also help control her blood pressure  Constipation likely opiate induced Already on bowel regimen as needed Added Senokot 2 tablet twice daily scheduled.    Thank you for this consultation.  [facility] hospital[facility] team will follow the patient with you.   Time Spent: 35 minutes.  Darlin Droparole N Masayoshi Couzens M.D. [facility] 08/30/2018, 12:08  PM

## 2018-08-30 NOTE — Plan of Care (Signed)
  Problem: Activity: Goal: Risk for activity intolerance will decrease Outcome: Progressing   Problem: Nutrition: Goal: Adequate nutrition will be maintained Outcome: Progressing   Problem: Pain Managment: Goal: General experience of comfort will improve Outcome: Progressing   Problem: Safety: Goal: Ability to remain free from injury will improve Outcome: Progressing   Problem: Skin Integrity: Goal: Risk for impaired skin integrity will decrease Outcome: Progressing   

## 2018-08-30 NOTE — Progress Notes (Signed)
Md notified of pt elevated sbp via amion. Pt stable at this time. No needs to report.

## 2018-08-30 NOTE — Progress Notes (Signed)
PATIENT ID: CHANCIE LAMPERT  MRN: [id number]  DOB/AGE:  01/22/1950 / 68 y.o.  1 Day Post-Op Procedure(s) (LRB): TOTAL KNEE ARTHROPLASTY (Right)    PROGRESS NOTE Subjective: Patient is alert, oriented, no Nausea, no Vomiting, yes passing gas. Taking PO with small bites. Denies SOB, Chest or Calf Pain. Using Incentive Spirometer, PAS in place. Ambulate WBAT with pt walking 8 ft with therapy, Patient reports pain as 6/10 .    Objective: Vital signs in last 24 hours: Vitals:   08/30/18 0031 08/30/18 0038 08/30/18 0159 08/30/18 0541  BP: (!) 215/91 (!) 197/76 (!) 186/81 (!) 178/83  Pulse: (!) 103 (!) 106 (!) 103 (!) 105  Resp: 18 18 18 18   Temp:   99 F (37.2 C) 98.9 F (37.2 C)  TempSrc:   Oral Oral  SpO2: 91% 93% 92% 98%  Weight:      Height:          Intake/Output from previous day: I/O last 3 completed shifts: In: 4424.9 [P.O.:840; I.V.:3286.4; IV Piggyback:298.5] Out: 8270 [Urine:5625; Blood:50]   Intake/Output this shift: Total I/O In: 260.6 [P.O.:120; I.V.:140.6] Out: 200 [Urine:200]   LABORATORY DATA: Recent Labs    08/29/18 1743 08/29/18 2147 08/30/18 0252 08/30/18 0742  WBC  --   --  10.0  --   HGB  --   --  12.0  --   HCT  --   --  36.3  --   PLT  --   --  302  --   GLUCAP 194* 161*  --  160*    Examination: Neurologically intact Neurovascular intact Sensation intact distally Intact pulses distally Dorsiflexion/Plantar flexion intact Incision: dressing C/D/I and no drainage No cellulitis present Compartment soft}  Assessment:   1 Day Post-Op Procedure(s) (LRB): TOTAL KNEE ARTHROPLASTY (Right) ADDITIONAL DIAGNOSIS: Expected Acute Blood Loss Anemia, Diabetes and Hypertension  Patient's anticipated LOS is less than 2 midnights, meeting these requirements: - Younger than 70 - Lives within 1 hour of care - Has a competent adult at home to recover with post-op recover - NO history of  - Chronic pain requiring opiods  - Coronary Artery Disease  -  Heart failure  - Heart attack  - Stroke  - DVT/VTE  - Cardiac arrhythmia  - Respiratory Failure/COPD  - Renal failure  - Anemia  - Advanced Liver disease       Plan: PT/OT WBAT, AROM and PROM  DVT Prophylaxis:  SCDx72hrs, ASA 325 mg x 4 weeks DISCHARGE PLAN: Home, once pt meets therapy goals DISCHARGE NEEDS: HHPT, CPM, Walker and 3-in-1 comode seat     Joanell Rising 08/30/2018, 8:48 AM

## 2018-08-30 NOTE — Plan of Care (Signed)

## 2018-08-30 NOTE — Progress Notes (Signed)
Isabella Rising pa notified of patient of pt high sbp of 196. Pa to get medical md on board. Pt reports mild pain. No needs at this time.

## 2018-08-30 NOTE — Progress Notes (Signed)
Physical Therapy Treatment Patient Details Name: Isabella Oliver MRN: [id number] DOB: 1950-10-07 Today's Date: 08/30/2018    History of Present Illness 68 yo female s/p R TKR on 08/29/18. PMH includes aortic stenosis, OA, DM, HTN, HLD, L TKR.    PT Comments    Pt fatigues very easily with gait today, chair follow for safety, required seated rest  after ~ 25'. Will see again in pm, unsure if pt will be ready for d/c today--will see how she does in pm  Follow Up Recommendations  Follow surgeon's recommendation for DC plan and follow-up therapies;Supervision for mobility/OOB(HHPT)     Equipment Recommendations  None recommended by PT    Recommendations for Other Services       Precautions / Restrictions Precautions Precautions: Fall Restrictions Weight Bearing Restrictions: No Other Position/Activity Restrictions: WBAT    Mobility  Bed Mobility Overal bed mobility: Needs Assistance Bed Mobility: Supine to Sit     Supine to sit: HOB elevated;Min assist     General bed mobility comments: Min assist for RLE lifting and translation to EOB. VErbal cuing for sequencing, increased time and effort.  Transfers Overall transfer level: Needs assistance Equipment used: Rolling walker (2 wheeled) Transfers: Sit to/from Stand Sit to Stand: Min guard;Min assist         General transfer comment: Min guard for safety, verbal cuing for hand placement.  Ambulation/Gait Ambulation/Gait assistance: Min assist Gait Distance (Feet): 25 Feet(12' to bathroom) Assistive device: Rolling walker (2 wheeled) Gait Pattern/deviations: Step-to pattern;Decreased step length - right;Decreased step length - left;Antalgic;Decreased weight shift to right;Decreased stance time - right     General Gait Details: Min assist for steadying, guarding of RLE due to RLE weakness. Verbal cuing for sequencing, placement in RW. pt limited by fatigue   Stairs             Wheelchair Mobility     Modified Rankin (Stroke Patients Only)       Balance                                            Cognition Arousal/Alertness: Awake/alert Behavior During Therapy: WFL for tasks assessed/performed Overall Cognitive Status: Within Functional Limits for tasks assessed                                        Exercises Total Joint Exercises Ankle Circles/Pumps: AROM;10 reps;Both Quad Sets: AROM;Both;10 reps    General Comments        Pertinent Vitals/Pain Pain Assessment: 0-10 Pain Score: 4  Pain Location: R knee Pain Descriptors / Indicators: Sore Pain Intervention(s): Limited activity within patient's tolerance;Monitored during session;Repositioned;Ice applied    Home Living                      Prior Function            PT Goals (current goals can now be found in the care plan section) Acute Rehab PT Goals Patient Stated Goal: go home PT Goal Formulation: With patient Time For Goal Achievement: 09/05/18 Potential to Achieve Goals: Good Progress towards PT goals: Progressing toward goals    Frequency    7X/week      PT Plan Current plan remains appropriate    Co-evaluation  AM-PAC PT "6 Clicks" Mobility   Outcome Measure  Help needed turning from your back to your side while in a flat bed without using bedrails?: A Little Help needed moving from lying on your back to sitting on the side of a flat bed without using bedrails?: A Little Help needed moving to and from a bed to a chair (including a wheelchair)?: A Little Help needed standing up from a chair using your arms (e.g., wheelchair or bedside chair)?: A Little Help needed to walk in hospital room?: A Little Help needed climbing 3-5 steps with a railing? : A Little 6 Click Score: 18    End of Session Equipment Utilized During Treatment: Gait belt Activity Tolerance: Patient tolerated treatment well;Patient limited by fatigue Patient  left: in chair;with chair alarm set;with call bell/phone within reach Nurse Communication: Mobility status PT Visit Diagnosis: Other abnormalities of gait and mobility (R26.89);Difficulty in walking, not elsewhere classified (R26.2)     Time: [id number] PT Time Calculation (min) (ACUTE ONLY): 20 min  Charges:  $Gait Training: 8-22 mins                     Drucilla Chaletara Charlita Brian, PT  Pager: [phone] Acute Rehab Dept [facility]): [phone]   08/30/2018    [facility],Constantine Ruddick 08/30/2018, 10:12 AM

## 2018-08-30 NOTE — Progress Notes (Signed)
    Home health agencies that serve [id number].        Home Health Agencies Search Results  Results List Table  Home Health Agency Information Quality of Patient Care Rating Patient Survey Summary Rating  [facility] [phone] 4 out of 5 stars 4 out of 5 stars  [facility] [phone] 3 out of 5 stars 4 out of 5 stars  [facility] [phone] 4  out of 5 stars 3 out of 5 stars  [facility] [phone] 4  out of 5 stars 4 out of 5 stars  [facility] [phone] 4 out of 5 stars 4 out of 5 stars  [facility] [phone] 4 out of 5 stars 4 out of 5 stars  [facility] [phone] 3  out of 5 stars 4 out of 5 stars  [facility] [phone] 3 out of 5 stars 4 out of 5 stars  [facility] [phone] 2  out of 5 stars 3 out of 5 stars  [facility] [phone] 4 out of 5 stars Not Available12  [facility] [phone] 3 out of 5 stars 4 out of 5 stars  [facility] [place] [phone] Not Available5 Not Available12  [facility] [phone] 3  out of 5 stars 3 out of 5 stars  [facility] [phone] 5 out of 5 stars 4 out of 5 stars  [facility] [phone] 3  out of 5 stars 4 out of 5 stars  [facility] [phone] 3  out of 5 stars 3 out of 5 stars  [facility] [phone] 3  out of 5 stars Not Available11  [facility] [phone] 4  out of 5 stars 3 out of 5 stars   Home Health Footnotes  Footnote number Footnote as displayed on Home Health Compare  1 This agency provides services under a federal waiver program to non-traditional, chronic long term population.  2 This agency provides services to a special needs population.  3 Not Available.  4 The number of patient episodes  for this measure is too small to report.  5 This measure currently does not have data or provider has been certified/recertified for less than 6 months.  6 The national average for this measure is not provided because of state-to-state differences in data collection.  7 Medicare is not displaying rates for this measure for any home health agency, because of an issue with the data.  8 There were problems with the data and they are being corrected.  9 Zero, or very few, patients met the survey's rules for inclusion. The scores shown, if any, reflect a very small number of surveys and may not accurately tell how an agency is doing.  10 Survey results are based on less than 12 months of data.  11 Fewer than 70 patients completed the survey. Use the scores shown, if any, with caution as the number of surveys may be too low to accurately tell how an agency is doing.  12 No survey results are available for this period.  13 Data suppressed by CMS for one or more quarters.    

## 2018-08-31 DIAGNOSIS — I10 Essential (primary) hypertension: Secondary | ICD-10-CM

## 2018-08-31 DIAGNOSIS — K5900 Constipation, unspecified: Secondary | ICD-10-CM | POA: Diagnosis not present

## 2018-08-31 DIAGNOSIS — M1711 Unilateral primary osteoarthritis, right knee: Secondary | ICD-10-CM

## 2018-08-31 LAB — BASIC METABOLIC PANEL
Anion gap: 15 (ref 5–15)
BUN: 21 mg/dL (ref 8–23)
CO2: 25 mmol/L (ref 22–32)
Calcium: 9.5 mg/dL (ref 8.9–10.3)
Chloride: 93 mmol/L — ABNORMAL LOW (ref 98–111)
Creatinine, Ser: 1.07 mg/dL — ABNORMAL HIGH (ref 0.44–1.00)
GFR calc Af Amer: >60 mL/min (ref >60)
GFR calc non Af Amer: 53 mL/min — ABNORMAL LOW (ref >60)
Glucose, Bld: 161 mg/dL — ABNORMAL HIGH (ref 70–99)
Potassium: 3.6 mmol/L (ref 3.5–5.1)
Sodium: 133 mmol/L — ABNORMAL LOW (ref 135–145)

## 2018-08-31 LAB — MAGNESIUM: Magnesium: 1.8 mg/dL (ref 1.7–2.4)

## 2018-08-31 LAB — CBC
HCT: 37.4 % (ref 36.0–46.0)
Hemoglobin: 12.3 g/dL (ref 12.0–15.0)
MCH: 28.9 pg (ref 26.0–34.0)
MCHC: 32.9 g/dL (ref 30.0–36.0)
MCV: 87.8 fL (ref 80.0–100.0)
Platelets: 342 10*3/uL (ref 150–400)
RBC: 4.26 MIL/uL (ref 3.87–5.11)
RDW: 13.9 % (ref 11.5–15.5)
WBC: 13.7 10*3/uL — ABNORMAL HIGH (ref 4.0–10.5)
nRBC: 0 % (ref 0.0–0.2)

## 2018-08-31 LAB — GLUCOSE, CAPILLARY
Glucose-Capillary: 171 mg/dL — ABNORMAL HIGH (ref 70–99)
Glucose-Capillary: 128 mg/dL — ABNORMAL HIGH (ref 70–99)

## 2018-08-31 MED ORDER — POLYETHYLENE GLYCOL 3350 17 G PO PACK
17.0000 g | PACK | Freq: Two times a day (BID) | ORAL | Status: DC
  Administered 2018-08-31: 17 g via ORAL
  Filled 2018-08-31: qty 1

## 2018-08-31 MED ORDER — CHLORTHALIDONE 50 MG PO TABS: 100.0000 mg | ORAL_TABLET | Freq: Every day | ORAL | 2 refills | Status: DC

## 2018-08-31 MED ORDER — POTASSIUM CHLORIDE CRYS ER 20 MEQ PO TBCR
40.0000 meq | EXTENDED_RELEASE_TABLET | Freq: Once | ORAL | Status: AC
  Administered 2018-08-31: 40 meq via ORAL
  Filled 2018-08-31: qty 2

## 2018-08-31 MED ORDER — MAGNESIUM SULFATE 2 GM/50ML IV SOLN: 2.0000 g | Freq: Once | INTRAVENOUS | Status: DC

## 2018-08-31 MED ORDER — SENNOSIDES-DOCUSATE SODIUM 8.6-50 MG PO TABS: 2.0000 | ORAL_TABLET | Freq: Two times a day (BID) | ORAL | Status: DC

## 2018-08-31 NOTE — Plan of Care (Signed)
  Problem: Clinical Measurements: Goal: Respiratory complications will improve Outcome: Progressing   Problem: Clinical Measurements: Goal: Cardiovascular complication will be avoided Outcome: Progressing   Problem: Activity: Goal: Risk for activity intolerance will decrease Outcome: Progressing   Problem: Safety: Goal: Ability to remain free from injury will improve Outcome: Progressing   Problem: Skin Integrity: Goal: Risk for impaired skin integrity will decrease Outcome: Progressing   

## 2018-08-31 NOTE — Progress Notes (Signed)
PATIENT ID: Isabella Oliver  MRN: [id number]  DOB/AGE:  [birth year] / 68 y.o.  2 Days Post-Op Procedure(s) (LRB): TOTAL KNEE ARTHROPLASTY (Right)    PROGRESS NOTE Subjective: Patient is alert, oriented, no Nausea, no Vomiting, yes passing gas. Taking PO well. Denies SOB, Chest or Calf Pain. Using Incentive Spirometer, PAS in place. Ambulate WBAT with pt walking with therapy, Patient reports pain as 4/10 .    Objective: Vital signs in last 24 hours: Vitals:   08/30/18 1758 08/30/18 2102 08/31/18 0054 08/31/18 0337  BP: (!) 170/78 (!) 175/87 (!) 161/78 (!) 164/79  Pulse: 86 88 93 89  Resp:  16 18 18   Temp:  98.5 F (36.9 C) 98.5 F (36.9 C) 98.5 F (36.9 C)  TempSrc:  Oral Oral Oral  SpO2:  99% 97% 97%  Weight:      Height:          Intake/Output from previous day: I/O last 3 completed shifts: In: 3673.9 [P.O.:2280; I.V.:1293.9; IV Piggyback:100] Out: 4750 [Urine:4750]   Intake/Output this shift: Total I/O In: 600 [P.O.:600] Out: -    LABORATORY DATA: Recent Labs    08/30/18 0252  08/30/18 1227 08/30/18 1608 08/30/18 2100 08/31/18 0328 08/31/18 0752  WBC 10.0  --   --   --   --  13.7*  --   HGB 12.0  --   --   --   --  12.3  --   HCT 36.3  --   --   --   --  37.4  --   PLT 302  --   --   --   --  342  --   NA  --   --  135  --   --   --   --   K  --   --  3.3*  --   --   --   --   CL  --   --  98  --   --   --   --   CO2  --   --  22  --   --   --   --   BUN  --   --  9  --   --   --   --   CREATININE  --   --  0.79  --   --   --   --   GLUCOSE  --   --  140*  --   --   --   --   GLUCAP  --    < >  --  163* 141*  --  171*  CALCIUM  --   --  9.1  --   --   --   --    < > = values in this interval not displayed.    Examination: Neurologically intact Neurovascular intact Sensation intact distally Intact pulses distally Dorsiflexion/Plantar flexion intact Incision: dressing C/D/I No cellulitis present Compartment soft}  Assessment:   2 Days Post-Op  Procedure(s) (LRB): TOTAL KNEE ARTHROPLASTY (Right) ADDITIONAL DIAGNOSIS: Expected Acute Blood Loss Anemia, Diabetes and Hypertension  Patient's anticipated LOS is less than 2 midnights, meeting these requirements: - Younger than 56 - Lives within 1 hour of care - Has a competent adult at home to recover with post-op recover - NO history of  - Chronic pain requiring opiods  - Diabetes  - Coronary Artery Disease  - Heart failure  - Heart attack  - Stroke  - DVT/VTE  -  Cardiac arrhythmia  - Respiratory Failure/COPD  - Renal failure  - Anemia  - Advanced Liver disease       Plan: PT/OT WBAT, AROM and PROM  DVT Prophylaxis:  SCDx72hrs, ASA 325 mg x 4 weeks DISCHARGE PLAN: Home, once she meets therapy goals DISCHARGE NEEDS: HHPT, CPM, Walker and 3-in-1 comode seat   We appreciate hospitalist input with pt's blood pressure.     Dannielle Burnric Kerrin Markman 08/31/2018, 8:47 AM

## 2018-08-31 NOTE — Progress Notes (Signed)
PROGRESS NOTE    GRACIOUS RENKEN  PNT:[id number] DOB: [birth year] DOA: 08/29/2018 PCP: Lavone Orn, MD    Brief Narrative:  68 year old female with history of type 2 diabetes, asthma, GERD, morbid obesity, hypertension, right knee osteoarthritis status post total knee arthroplasty on 08/29/2018.  Primary team orthopedic surgery consulted hospitalist team due to uncontrolled hypertension.  Pain is fairly well controlled so unlikely to contribute to her uncontrolled hypertension.  She has no new symptoms.  She denies any cardiopulmonary symptoms.  A little constipated, last bowel movement was 3 days ago.  No nausea or abdominal pain.    She was receiving normal saline infusion at 100 cc/h, discontinued this morning.  Chlorthalidone increased from 25 mg daily to 50 mg daily.  Obtain BMP and magnesium level as add on in the setting of diuretics to monitor renal function and electrolytes.    Assessment & Plan:   Principal Problem:   Primary osteoarthritis of right knee  1 uncontrolled hypertension Hospitalist were called for consultation due to not controlled blood pressure.  Patient's IV fluids subsequently discontinued as it was felt that could be contributing to patient's elevated blood pressure.  Patient was maintained on home regimen of amlodipine 10 mg daily as well as losartan 100 mg daily.  Patient's home dose of chlorthalidone was increased from 25 mg to 100 mg daily.  Electrolytes were checked and repleted.  Patient's blood pressure improved.  Patient be discharged home on home regimen of Norvasc, losartan and increased dose of chlorthalidone.  Outpatient follow-up with PCP for further blood pressure management.  2.  Constipation likely opioid induced Patient with complaints of constipation.  Patient noted to be on a bowel regiment.  Senokot-S twice daily was added.  Patient was given a enema prior to discharge.  Patient be discharged on bowel regimen.  Outpatient follow-up with PCP.   3.  Obesity  4.  Primary osteoarthritis of the right knee Patient underwent total knee arthroplasty on 08/29/2018.  Patient was seen by PT OT.  Per primary team.   DVT prophylaxis: Per primary team. Code Status: Full Family Communication: Updated patient and family at bedside. Disposition Plan: Patient medically stable for discharge.   Consultants:   [facility]: Dr Nevada Crane 08/30/2018  Procedures:   Total knee arthroplasty per Dr. Berenice Primas 08/29/2018  Antimicrobials:   None   Subjective: Patient sitting up in bed.  Complains of constipation.  Feeling better.  Feels blood pressure has improved.  Objective: Vitals:   08/30/18 1758 08/30/18 2102 08/31/18 0054 08/31/18 0337  BP: (!) 170/78 (!) 175/87 (!) 161/78 (!) 164/79  Pulse: 86 88 93 89  Resp:  16 18 18   Temp:  98.5 F (36.9 C) 98.5 F (36.9 C) 98.5 F (36.9 C)  TempSrc:  Oral Oral Oral  SpO2:  99% 97% 97%  Weight:      Height:        Intake/Output Summary (Last 24 hours) at 08/31/2018 1104 Last data filed at 08/31/2018 0830 Gross per 24 hour  Intake 1680 ml  Output 1450 ml  Net 230 ml   Filed Weights   08/29/18 0541  Weight: 91 kg    Examination:  General exam: Appears calm and comfortable  Respiratory system: Clear to auscultation. Respiratory effort normal. Cardiovascular system: S1 & S2 heard, RRR. No JVD, murmurs, rubs, gallops or clicks. No pedal edema. Gastrointestinal system: Abdomen is nondistended, soft and nontender. No organomegaly or masses felt. Normal bowel sounds heard. Central nervous system: Alert and  oriented. No focal neurological deficits. Extremities: Symmetric 5 x 5 power. Skin: No rashes, lesions or ulcers Psychiatry: Judgement and insight appear normal. Mood & affect appropriate.     Data Reviewed: I have personally reviewed following labs and imaging studies  CBC: Recent Labs  Lab 08/30/18 0252 08/31/18 0328  WBC 10.0 13.7*  HGB 12.0 12.3  HCT 36.3 37.4  MCV  89.4 87.8  PLT 302 342   Basic Metabolic Panel: Recent Labs  Lab 08/30/18 1227 08/31/18 0326  NA 135 133*  K 3.3* 3.6  CL 98 93*  CO2 22 25  GLUCOSE 140* 161*  BUN 9 21  CREATININE 0.79 1.07*  CALCIUM 9.1 9.5  MG 1.4* 1.8   GFR: Estimated Creatinine Clearance: 53.7 mL/min (A) (by C-G formula based on SCr of 1.07 mg/dL (H)). Liver Function Tests: No results for input(s): AST, ALT, ALKPHOS, BILITOT, PROT, ALBUMIN in the last 168 hours. No results for input(s): LIPASE, AMYLASE in the last 168 hours. No results for input(s): AMMONIA in the last 168 hours. Coagulation Profile: No results for input(s): INR, PROTIME in the last 168 hours. Cardiac Enzymes: No results for input(s): CKTOTAL, CKMB, CKMBINDEX, TROPONINI in the last 168 hours. BNP (last 3 results) No results for input(s): PROBNP in the last 8760 hours. HbA1C: No results for input(s): HGBA1C in the last 72 hours. CBG: Recent Labs  Lab 08/30/18 0742 08/30/18 1055 08/30/18 1608 08/30/18 2100 08/31/18 0752  GLUCAP 160* 183* 163* 141* 171*   Lipid Profile: No results for input(s): CHOL, HDL, LDLCALC, TRIG, CHOLHDL, LDLDIRECT in the last 72 hours. Thyroid Function Tests: No results for input(s): TSH, T4TOTAL, FREET4, T3FREE, THYROIDAB in the last 72 hours. Anemia Panel: No results for input(s): VITAMINB12, FOLATE, FERRITIN, TIBC, IRON, RETICCTPCT in the last 72 hours. Sepsis Labs: No results for input(s): PROCALCITON, LATICACIDVEN in the last 168 hours.  Recent Results (from the past 240 hour(s))  Surgical pcr screen     Status: Abnormal   Collection Time: 08/22/18 10:24 AM   Specimen: Nasal Mucosa; Nasal Swab  Result Value Ref Range Status   MRSA, PCR NEGATIVE NEGATIVE Final   Staphylococcus aureus POSITIVE (A) NEGATIVE Final    Comment: (NOTE) The Xpert SA Assay (FDA approved for NASAL specimens in patients 70 years of age and older), is one component of a comprehensive surveillance program. It is not  intended to diagnose infection nor to guide or monitor treatment. Performed at [facility], [address] [place], [place] [address]   SARS CORONAVIRUS 2 Nasal Swab Aptima Multi Swab     Status: None   Collection Time: 08/26/18  2:02 PM   Specimen: Aptima Multi Swab; Nasal Swab  Result Value Ref Range Status   SARS Coronavirus 2 NEGATIVE NEGATIVE Final    Comment: (NOTE) SARS-CoV-2 target nucleic acids are NOT DETECTED. The SARS-CoV-2 RNA is generally detectable in upper and lower respiratory specimens during the acute phase of infection. Negative results do not preclude SARS-CoV-2 infection, do not rule out co-infections with other pathogens, and should not be used as the sole basis for treatment or other patient management decisions. Negative results must be combined with clinical observations, patient history, and epidemiological information. The expected result is Negative. Fact Sheet for Patients: HairSlick.nohttps://www.fda.gov/media/138098/download Fact Sheet for Healthcare Providers: quierodirigir.comhttps://www.fda.gov/media/138095/download This test is not yet approved or cleared by the [place] FDA and  has been authorized for detection and/or diagnosis of SARS-CoV-2 by FDA under an Emergency Use Authorization (EUA). This  EUA will remain  in effect (meaning this test can be used) for the duration of the COVID-19 declaration under Section 56 4(b)(1) of the Act, 21 U.S.C. section 360bbb-3(b)(1), unless the authorization is terminated or revoked sooner. Performed at [facility], [address] [place], [place] [address]          Radiology Studies: No results found.      Scheduled Meds: . amLODipine  10 mg Oral Daily  . aspirin EC  325 mg Oral BID PC  . canagliflozin  100 mg Oral QAC breakfast  . chlorthalidone  100 mg Oral Daily  . docusate sodium  100 mg Oral BID  . gabapentin  300 mg Oral BID  . glimepiride  2 mg Oral Q breakfast  . insulin  aspart  0-15 Units Subcutaneous TID WC  . losartan  100 mg Oral Daily  . metoprolol succinate  50 mg Oral Daily  . pantoprazole  40 mg Oral Daily  . polyethylene glycol  17 g Oral BID  . potassium chloride  10 mEq Oral Daily  . potassium chloride  40 mEq Oral Once  . senna-docusate  2 tablet Oral BID   Continuous Infusions: . magnesium sulfate bolus IVPB       LOS: 0 days    Time spent: 35 minutes    Ramiro Harvestaniel Jeramiah Mccaughey, MD [facility]  If 7PM-7AM, please contact night-coverage [email or web address] 08/31/2018, 11:04 AM

## 2018-08-31 NOTE — Progress Notes (Signed)
Pt refusing soap suds enema at this time.

## 2018-08-31 NOTE — Progress Notes (Signed)
Physical Therapy Treatment Patient Details Name: Isabella Oliver MRN: [id number] DOB: [birth year] Today's Date: 08/31/2018    History of Present Illness 68 yo female s/p R TKR on 08/29/18. PMH includes aortic stenosis, OA, DM, HTN, HLD, L TKR.    PT Comments    Pt progressing. Will see for second session to review stairs  Follow Up Recommendations  Follow surgeon's recommendation for DC plan and follow-up therapies;Supervision for mobility/OOB     Equipment Recommendations  None recommended by PT    Recommendations for Other Services       Precautions / Restrictions Precautions Precautions: Knee;Fall Restrictions Weight Bearing Restrictions: No Other Position/Activity Restrictions: WBAT    Mobility  Bed Mobility   Bed Mobility: Supine to Sit           General bed mobility comments: in recliner   Transfers Overall transfer level: Needs assistance Equipment used: Rolling walker (2 wheeled) Transfers: Sit to/from Stand Sit to Stand: Min guard         General transfer comment: Min guard for safety, verbal cuing for hand placement.  Ambulation/Gait Ambulation/Gait assistance: Min guard Gait Distance (Feet): 120 Feet Assistive device: Rolling walker (2 wheeled) Gait Pattern/deviations: Step-to pattern;Decreased weight shift to right     General Gait Details: cues for sequence inititially, improved wt shift to RLE    Stairs             Wheelchair Mobility    Modified Rankin (Stroke Patients Only)       Balance                                            Cognition Arousal/Alertness: Awake/alert Behavior During Therapy: WFL for tasks assessed/performed Overall Cognitive Status: Within Functional Limits for tasks assessed                                        Exercises Total Joint Exercises Ankle Circles/Pumps: AROM;10 reps;Both Quad Sets: AROM;Both;10 reps Heel Slides: AROM;AAROM;Right;10 reps Hip  ABduction/ADduction: AAROM;Right;10 reps Straight Leg Raises: AAROM;Right;10 reps Knee Flexion: AROM;Right;5 reps;Seated    General Comments        Pertinent Vitals/Pain Pain Assessment: 0-10 Pain Score: 3  Pain Location: R knee Pain Descriptors / Indicators: Sore Pain Intervention(s): Limited activity within patient's tolerance;Monitored during session;Premedicated before session;Ice applied    Home Living                      Prior Function            PT Goals (current goals can now be found in the care plan section) Acute Rehab PT Goals Patient Stated Goal: go home PT Goal Formulation: With patient Time For Goal Achievement: 09/05/18 Potential to Achieve Goals: Good Progress towards PT goals: Progressing toward goals    Frequency    7X/week      PT Plan Current plan remains appropriate    Co-evaluation              AM-PAC PT "6 Clicks" Mobility   Outcome Measure  Help needed turning from your back to your side while in a flat bed without using bedrails?: A Little Help needed moving from lying on your back to sitting on the side of a flat bed without  using bedrails?: A Little Help needed moving to and from a bed to a chair (including a wheelchair)?: A Little Help needed standing up from a chair using your arms (e.g., wheelchair or bedside chair)?: A Little Help needed to walk in hospital room?: A Little Help needed climbing 3-5 steps with a railing? : A Little 6 Click Score: 18    End of Session Equipment Utilized During Treatment: Gait belt Activity Tolerance: Patient tolerated treatment well Patient left: in chair;with call bell/phone within reach;with chair alarm set Nurse Communication: Mobility status PT Visit Diagnosis: Other abnormalities of gait and mobility (R26.89);Difficulty in walking, not elsewhere classified (R26.2)     Time: 1583-0940 PT Time Calculation (min) (ACUTE ONLY): 27 min  Charges:  $Gait Training: 8-22  mins $Therapeutic Exercise: 8-22 mins                     Kenyon Ana, PT  Pager: [phone] Acute Rehab Dept [facility]): [phone]   08/31/2018    [facility] 08/31/2018, 10:39 AM

## 2018-08-31 NOTE — Progress Notes (Signed)
   08/31/18 1200  PT Visit Information  Last PT Received On 08/31/18--pt progressing well; reviewed stairs with pt and son as well as gait/transfer safety; pt is ready to d/c from PT standpoint  Assistance Needed +1  History of Present Illness 68 yo female s/p R TKR on 08/29/18. PMH includes aortic stenosis, OA, DM, HTN, HLD, L TKR.  Subjective Data  Patient Stated Goal go home  Precautions  Precautions Knee;Fall  Restrictions  Weight Bearing Restrictions No  Other Position/Activity Restrictions WBAT  Pain Assessment  Pain Assessment 0-10  Pain Score 3  Pain Location R knee  Pain Descriptors / Indicators Sore  Pain Intervention(s) Limited activity within patient's tolerance;Monitored during session;Repositioned  Cognition  Arousal/Alertness Awake/alert  Behavior During Therapy WFL for tasks assessed/performed  Overall Cognitive Status Within Functional Limits for tasks assessed  Bed Mobility  Bed Mobility Sit to Supine  Sit to supine Min guard  General bed mobility comments min/guard with RLE, using gait belt as leg lifter  Transfers  Overall transfer level Needs assistance  Equipment used Rolling walker (2 wheeled)  Transfers Sit to/from Stand  Sit to Stand Supervision  General transfer comment cues for hand placement  Ambulation/Gait  Ambulation/Gait assistance Supervision  Gait Distance (Feet) 40 Feet  Assistive device Rolling walker (2 wheeled)  Gait Pattern/deviations Step-to pattern;Decreased weight shift to right  General Gait Details cues for sequence inititially, improved wt shift to RLE   Stairs Yes  Stairs assistance Min assist  Stair Management No rails;Step to pattern;Backwards;With walker  Number of Stairs 2  General stair comments cues for sequence and technique,  son present, pt and son able to return demo without difficulty  PT - End of Session  Equipment Utilized During Treatment Gait belt  Activity Tolerance Patient tolerated treatment well  Patient  left with call bell/phone within reach;in bed;with family/visitor present  Nurse Communication Mobility status   PT - Assessment/Plan  PT Plan Current plan remains appropriate  PT Visit Diagnosis Other abnormalities of gait and mobility (R26.89);Difficulty in walking, not elsewhere classified (R26.2)  PT Frequency (ACUTE ONLY) 7X/week  Follow Up Recommendations Follow surgeon's recommendation for DC plan and follow-up therapies;Supervision for mobility/OOB  PT equipment None recommended by PT  AM-PAC PT "6 Clicks" Mobility Outcome Measure (Version 2)  Help needed turning from your back to your side while in a flat bed without using bedrails? 3  Help needed moving from lying on your back to sitting on the side of a flat bed without using bedrails? 3  Help needed moving to and from a bed to a chair (including a wheelchair)? 3  Help needed standing up from a chair using your arms (e.g., wheelchair or bedside chair)? 3  Help needed to walk in hospital room? 3  Help needed climbing 3-5 steps with a railing?  3  6 Click Score 18  Consider Recommendation of Discharge To: Home with [facility]  PT Goal Progression  Progress towards PT goals Progressing toward goals  Acute Rehab PT Goals  PT Goal Formulation With patient  Time For Goal Achievement 09/05/18  Potential to Achieve Goals Good  PT Time Calculation  PT Start Time (ACUTE ONLY) 1204  PT Stop Time (ACUTE ONLY) 1217  PT Time Calculation (min) (ACUTE ONLY) 13 min  PT General Charges  $$ ACUTE PT VISIT 1 Visit  PT Treatments  $Gait Training 8-22 mins  Kenyon Ana, PT  Pager: [phone] Acute Rehab Dept [facility]): [phone]   08/31/2018

## 2018-08-31 NOTE — Plan of Care (Signed)
Pt stable at this time. Pt to d/c home with family. Pt has all necessary dme at home. Pt reported mild and tolerable pain.

## 2018-08-31 NOTE — Plan of Care (Signed)
  Problem: Clinical Measurements: Goal: Ability to maintain clinical measurements within normal limits will improve Outcome: Progressing   Problem: Activity: Goal: Risk for activity intolerance will decrease Outcome: Progressing   Problem: Pain Managment: Goal: General experience of comfort will improve Outcome: Progressing   Problem: Activity: Goal: Range of joint motion will improve Outcome: Progressing   Problem: Pain Management: Goal: Pain level will decrease with appropriate interventions Outcome: Progressing

## 2018-09-01 ENCOUNTER — Encounter (HOSPITAL_COMMUNITY): Payer: Self-pay | Admitting: Orthopedic Surgery

## 2018-09-01 DIAGNOSIS — I10 Essential (primary) hypertension: Secondary | ICD-10-CM

## 2018-09-01 DIAGNOSIS — K5900 Constipation, unspecified: Secondary | ICD-10-CM

## 2018-11-11 ENCOUNTER — Other Ambulatory Visit: Payer: Self-pay

## 2018-11-11 DIAGNOSIS — Z20822 Contact with and (suspected) exposure to covid-19: Secondary | ICD-10-CM

## 2018-11-12 LAB — NOVEL CORONAVIRUS, NAA: SARS-CoV-2, NAA: NOT DETECTED

## 2019-04-13 ENCOUNTER — Ambulatory Visit: Payer: Medicare Other | Attending: Internal Medicine

## 2019-04-13 DIAGNOSIS — Z23 Encounter for immunization: Secondary | ICD-10-CM

## 2019-04-13 NOTE — Progress Notes (Signed)
   Covid-19 Vaccination Clinic  Name:  Isabella Oliver    MRN: [id number] DOB: 1950/11/01  04/13/2019  Isabella Oliver was observed post Covid-19 immunization for 15 minutes without incident. She was provided with Vaccine Information Sheet and instruction to access the V-Safe system.   Isabella Oliver was instructed to call 911 with any severe reactions post vaccine: Marland Kitchen Difficulty breathing  . Swelling of face and throat  . A fast heartbeat  . A bad rash all over body  . Dizziness and weakness   Immunizations Administered    Name Date Dose VIS Date Route   Pfizer COVID-19 Vaccine 04/13/2019 12:51 PM 0.3 mL 12/19/2018 Intramuscular   Manufacturer: [employer], [employer]   Lot: [id number]   NDC: [address]-5

## 2019-05-05 ENCOUNTER — Ambulatory Visit: Payer: Medicare Other | Attending: Internal Medicine

## 2019-05-05 DIAGNOSIS — Z23 Encounter for immunization: Secondary | ICD-10-CM

## 2019-05-05 NOTE — Progress Notes (Signed)
   Covid-19 Vaccination Clinic  Name:  Isabella Oliver    MRN: [id number] DOB: 22-May-1950  05/05/2019  Ms. Isabella Oliver was observed post Covid-19 immunization for 15 minutes without incident. She was provided with Vaccine Information Sheet and instruction to access the V-Safe system.   Ms. Isabella Oliver was instructed to call 911 with any severe reactions post vaccine: Marland Kitchen Difficulty breathing  . Swelling of face and throat  . A fast heartbeat  . A bad rash all over body  . Dizziness and weakness   Immunizations Administered    Name Date Dose VIS Date Route   Pfizer COVID-19 Vaccine 05/05/2019 12:29 PM 0.3 mL 03/04/2018 Intramuscular   Manufacturer: [employer], [employer]   Lot: [id number]   NDC: [address]-8

## 2019-05-26 ENCOUNTER — Other Ambulatory Visit: Payer: Self-pay | Admitting: Internal Medicine

## 2019-05-26 ENCOUNTER — Ambulatory Visit
Admission: RE | Admit: 2019-05-26 | Discharge: 2019-05-26 | Disposition: A | Discharge status: Home / Self Care | Payer: Medicare Other | Source: Ambulatory Visit | Attending: Internal Medicine | Admitting: Internal Medicine

## 2019-05-26 DIAGNOSIS — M25571 Pain in right ankle and joints of right foot: Secondary | ICD-10-CM

## 2019-07-16 ENCOUNTER — Other Ambulatory Visit: Payer: Self-pay | Admitting: Internal Medicine

## 2019-07-16 DIAGNOSIS — Z1231 Encounter for screening mammogram for malignant neoplasm of breast: Secondary | ICD-10-CM

## 2019-08-06 ENCOUNTER — Ambulatory Visit
Admission: RE | Admit: 2019-08-06 | Discharge: 2019-08-06 | Disposition: A | Discharge status: Home / Self Care | Payer: Medicare Other | Source: Ambulatory Visit | Attending: Internal Medicine | Admitting: Internal Medicine

## 2019-08-06 ENCOUNTER — Other Ambulatory Visit: Payer: Self-pay

## 2019-08-06 DIAGNOSIS — Z1231 Encounter for screening mammogram for malignant neoplasm of breast: Secondary | ICD-10-CM

## 2019-08-12 ENCOUNTER — Other Ambulatory Visit: Payer: Self-pay | Admitting: Family Medicine

## 2019-08-12 ENCOUNTER — Other Ambulatory Visit: Payer: Self-pay | Admitting: Internal Medicine

## 2019-08-12 DIAGNOSIS — R928 Other abnormal and inconclusive findings on diagnostic imaging of breast: Secondary | ICD-10-CM

## 2019-08-21 ENCOUNTER — Ambulatory Visit
Admission: RE | Admit: 2019-08-21 | Discharge: 2019-08-21 | Disposition: A | Discharge status: Home / Self Care | Payer: Medicare Other | Source: Ambulatory Visit | Attending: Internal Medicine | Admitting: Internal Medicine

## 2019-08-21 ENCOUNTER — Other Ambulatory Visit: Payer: Self-pay

## 2019-08-21 DIAGNOSIS — R928 Other abnormal and inconclusive findings on diagnostic imaging of breast: Secondary | ICD-10-CM

## 2019-10-23 ENCOUNTER — Emergency Department (HOSPITAL_COMMUNITY): Payer: Medicare Other

## 2019-10-23 ENCOUNTER — Encounter (HOSPITAL_COMMUNITY): Payer: Self-pay | Admitting: Emergency Medicine

## 2019-10-23 ENCOUNTER — Emergency Department (HOSPITAL_COMMUNITY)
Admission: EM | Admit: 2019-10-23 | Discharge: 2019-10-24 | Disposition: A | Discharge status: Home / Self Care | Payer: Medicare Other | Attending: Emergency Medicine | Admitting: Emergency Medicine

## 2019-10-23 DIAGNOSIS — Z96653 Presence of artificial knee joint, bilateral: Secondary | ICD-10-CM | POA: Diagnosis not present

## 2019-10-23 DIAGNOSIS — Z7982 Long term (current) use of aspirin: Secondary | ICD-10-CM | POA: Diagnosis not present

## 2019-10-23 DIAGNOSIS — J4521 Mild intermittent asthma with (acute) exacerbation: Secondary | ICD-10-CM | POA: Insufficient documentation

## 2019-10-23 DIAGNOSIS — Z79899 Other long term (current) drug therapy: Secondary | ICD-10-CM | POA: Diagnosis not present

## 2019-10-23 DIAGNOSIS — Z7984 Long term (current) use of oral hypoglycemic drugs: Secondary | ICD-10-CM | POA: Insufficient documentation

## 2019-10-23 DIAGNOSIS — E119 Type 2 diabetes mellitus without complications: Secondary | ICD-10-CM | POA: Diagnosis not present

## 2019-10-23 DIAGNOSIS — J189 Pneumonia, unspecified organism: Secondary | ICD-10-CM

## 2019-10-23 DIAGNOSIS — R059 Cough, unspecified: Secondary | ICD-10-CM | POA: Diagnosis present

## 2019-10-23 DIAGNOSIS — I10 Essential (primary) hypertension: Secondary | ICD-10-CM | POA: Insufficient documentation

## 2019-10-23 DIAGNOSIS — Z20822 Contact with and (suspected) exposure to covid-19: Secondary | ICD-10-CM | POA: Insufficient documentation

## 2019-10-23 DIAGNOSIS — Z87891 Personal history of nicotine dependence: Secondary | ICD-10-CM | POA: Insufficient documentation

## 2019-10-23 LAB — BASIC METABOLIC PANEL
Anion gap: 14 (ref 5–15)
BUN: 13 mg/dL (ref 8–23)
CO2: 27 mmol/L (ref 22–32)
Calcium: 9.4 mg/dL (ref 8.9–10.3)
Chloride: 97 mmol/L — ABNORMAL LOW (ref 98–111)
Creatinine, Ser: 1.19 mg/dL — ABNORMAL HIGH (ref 0.44–1.00)
GFR, Estimated: 46 mL/min — ABNORMAL LOW (ref >60)
Glucose, Bld: 161 mg/dL — ABNORMAL HIGH (ref 70–99)
Potassium: 3.2 mmol/L — ABNORMAL LOW (ref 3.5–5.1)
Sodium: 138 mmol/L (ref 135–145)

## 2019-10-23 LAB — CBC
HCT: 36.7 % (ref 36.0–46.0)
Hemoglobin: 12.1 g/dL (ref 12.0–15.0)
MCH: 31.7 pg (ref 26.0–34.0)
MCHC: 33 g/dL (ref 30.0–36.0)
MCV: 96.1 fL (ref 80.0–100.0)
Platelets: 308 10*3/uL (ref 150–400)
RBC: 3.82 MIL/uL — ABNORMAL LOW (ref 3.87–5.11)
RDW: 14.8 % (ref 11.5–15.5)
WBC: 7 10*3/uL (ref 4.0–10.5)
nRBC: 0 % (ref 0.0–0.2)

## 2019-10-23 NOTE — ED Triage Notes (Signed)
Pt reports flying back from [place] recently, started having cough, stopped up ears, sore throat on the 11th, now having some sob. Pt states she has had covid vaccines but has been unable to get flu shot yet. A/ox4, resp e/u, nad.

## 2019-10-24 LAB — RESPIRATORY PANEL BY RT PCR (FLU A&B, COVID)
Influenza A by PCR: NEGATIVE
Influenza B by PCR: NEGATIVE
SARS Coronavirus 2 by RT PCR: NEGATIVE

## 2019-10-24 MED ORDER — DEXAMETHASONE SODIUM PHOSPHATE 10 MG/ML IJ SOLN
10.0000 mg | Freq: Once | INTRAMUSCULAR | Status: AC
  Administered 2019-10-24: 10 mg via INTRAVENOUS
  Filled 2019-10-24: qty 1

## 2019-10-24 MED ORDER — DOXYCYCLINE HYCLATE 100 MG PO TABS
100.0000 mg | ORAL_TABLET | Freq: Once | ORAL | Status: AC
  Administered 2019-10-24: 100 mg via ORAL
  Filled 2019-10-24: qty 1

## 2019-10-24 MED ORDER — ALBUTEROL SULFATE HFA 108 (90 BASE) MCG/ACT IN AERS
4.0000 | INHALATION_SPRAY | Freq: Once | RESPIRATORY_TRACT | Status: AC
  Administered 2019-10-24: 4 via RESPIRATORY_TRACT
  Filled 2019-10-24: qty 6.7

## 2019-10-24 MED ORDER — DOXYCYCLINE HYCLATE 100 MG PO CAPS: 100.0000 mg | ORAL_CAPSULE | Freq: Two times a day (BID) | ORAL | 0 refills | Status: DC

## 2019-10-24 MED ORDER — IPRATROPIUM-ALBUTEROL 0.5-2.5 (3) MG/3ML IN SOLN
3.0000 mL | Freq: Once | RESPIRATORY_TRACT | Status: AC
  Administered 2019-10-24: 3 mL via RESPIRATORY_TRACT
  Filled 2019-10-24: qty 3

## 2019-10-24 NOTE — ED Provider Notes (Addendum)
[facility] EMERGENCY DEPARTMENT Provider Note   CSN: [id number] Arrival date & time: 10/23/19  1434     History Chief Complaint  Patient presents with  . Shortness of Breath  . Cough    Isabella Oliver is a 69 y.o. female.  HPI     This is a 69 year old female with a history of asthma, diabetes, hypertension, hyperlipidemia who presents with shortness of breath. Patient reports that she has had increasing shortness of breath since October 11. She did recently travel to [facility]. She has had some cough that has been nonproductive. No fevers or chills. She is fully vaccinated against COVID-19; however, has had loss of sense of taste and smell. She denies chest pain or leg swelling. No history of blood clots. She has used her albuterol inhaler at night with some relief. She is also former smoker.  Past Medical History:  Diagnosis Date  . Allergy   . Arthritis   . Asthma   . Diabetes mellitus without complication (HCC)   . GERD (gastroesophageal reflux disease)   . Heart murmur    Grade I/VI 12/2013 at [facility] office  . History of carpal tunnel syndrome    Bilateral  . History of iron deficiency anemia   . Hyperlipidemia   . Hypertension   . Pneumonia     Patient Active Problem List   Diagnosis Date Noted  . Uncontrolled hypertension   . Constipation   . Primary osteoarthritis of right knee 08/29/2018  . Pre-operative cardiovascular examination 07/17/2018  . Mild aortic stenosis 07/17/2018  . Primary osteoarthritis of left knee 02/20/2016  . Arthritis 10/30/2013  . Type 2 diabetes mellitus without complication (HCC) 10/30/2013  . Essential hypertension 10/30/2013  . Hyperlipidemia 10/30/2013    Past Surgical History:  Procedure Laterality Date  . BILATERAL CARPAL TUNNEL RELEASE    . COLONOSCOPY    . TOTAL KNEE ARTHROPLASTY Left 02/20/2016   Procedure: TOTAL KNEE ARTHROPLASTY;  Surgeon: Jodi Geralds, MD;  Location: [facility] OR;  Service:  Orthopedics;  Laterality: Left;  . TOTAL KNEE ARTHROPLASTY Right 08/29/2018   Procedure: TOTAL KNEE ARTHROPLASTY;  Surgeon: Jodi Geralds, MD;  Location: [facility] ORS;  Service: Orthopedics;  Laterality: Right;  . TUBAL LIGATION    . TUBAL LIGATION       OB History   No obstetric history on file.     Family History  Problem Relation Age of Onset  . Lung disease Mother   . Hypertension Father   . Cancer Sister   . Diabetes Sister   . Cancer Maternal Grandmother   . Cancer Maternal Grandfather     Social History   Tobacco Use  . Smoking status: Former Smoker    Packs/day: 0.50    Years: 20.00    Pack years: 10.00    Types: Cigarettes    Quit date: 2001    Years since quitting: 20.8  . Smokeless tobacco: Never Used  Vaping Use  . Vaping Use: Never used  Substance Use Topics  . Alcohol use: Not Currently    Comment: seldom  . Drug use: No    Home Medications Prior to Admission medications   Medication Sig Start Date End Date Taking? Authorizing Provider  acetaminophen (TYLENOL) 500 MG tablet Take 1,000 mg by mouth every 6 (six) hours as needed for moderate pain.    [provider]  albuterol (PROVENTIL HFA;VENTOLIN HFA) 108 (90 Base) MCG/ACT inhaler Inhale 1-2 puffs into the lungs every 6 (six)  hours as needed for wheezing or shortness of breath.    [provider]  amLODipine (NORVASC) 10 MG tablet Take 10 mg by mouth daily.    [provider]  aspirin EC 325 MG tablet Take 1 tablet (325 mg total) by mouth 2 (two) times daily after a meal. Take x 1 month post op to decrease risk of blood clots. 08/29/18   Marshia Ly, PA-C  betamethasone dipropionate 0.05 % lotion Apply 1 application topically daily as needed (scalp irritation).    [provider]  chlorthalidone (HYGROTON) 50 MG tablet Take 2 tablets (100 mg total) by mouth daily. 09/01/18   Rodolph Bong, MD  Cholecalciferol (VITAMIN D3) 50 MCG (2000 UT) TABS Take 2,000 Units by mouth  daily.     [provider]  Ciclopirox 1 % shampoo Apply 1 application topically 2 (two) times a week.    [provider]  doxycycline (VIBRAMYCIN) 100 MG capsule Take 1 capsule (100 mg total) by mouth 2 (two) times daily. 10/24/19   Livy Ross, Mayer Masker, MD  empagliflozin (JARDIANCE) 25 MG TABS tablet Take 25 mg by mouth daily.    [provider]  ezetimibe (ZETIA) 10 MG tablet Take 10 mg by mouth daily.    [provider]  glimepiride (AMARYL) 4 MG tablet Take 2 mg by mouth daily with breakfast.     [provider]  losartan (COZAAR) 100 MG tablet Take 100 mg by mouth daily.    [provider]  oxyCODONE-acetaminophen (PERCOCET/ROXICET) 5-325 MG tablet Take 1-2 tablets by mouth every 6 (six) hours as needed for severe pain. 08/29/18   Marshia Ly, PA-C  pantoprazole (PROTONIX) 40 MG tablet Take 40 mg by mouth daily.    [provider]  potassium chloride (MICRO-K) 10 MEQ CR capsule Take 10 mEq by mouth daily.    [provider]  senna-docusate (SENOKOT-S) 8.6-50 MG tablet Take 2 tablets by mouth 2 (two) times daily. 08/31/18   Rodolph Bong, MD  tiZANidine (ZANAFLEX) 2 MG tablet Take 1 tablet (2 mg total) by mouth every 8 (eight) hours as needed for muscle spasms. 08/29/18   Marshia Ly, PA-C    Allergies    Food, Codeine, Methocarbamol, Tramadol, and Voltaren [diclofenac sodium]  Review of Systems   Review of Systems  Constitutional: Positive for chills. Negative for fever.  Respiratory: Positive for cough and shortness of breath.   Cardiovascular: Negative for chest pain and leg swelling.  Gastrointestinal: Negative for abdominal pain.  Genitourinary: Negative for dysuria.  All other systems reviewed and are negative.   Physical Exam Updated Vital Signs BP (!) 146/71   Pulse 89   Temp 99.6 F (37.6 C) (Oral)   Resp 17   Ht 1.6 m (5\' 3" )   Wt 89.4 kg   SpO2 100%   BMI 34.90 kg/m   Physical  Exam Vitals and nursing note reviewed.  Constitutional:      Appearance: She is well-developed. She is obese.  HENT:     Head: Normocephalic and atraumatic.  Eyes:     Pupils: Pupils are equal, round, and reactive to light.  Cardiovascular:     Rate and Rhythm: Normal rate and regular rhythm.     Heart sounds: Normal heart sounds.  Pulmonary:     Effort: Pulmonary effort is normal. No tachypnea, accessory muscle usage or respiratory distress.     Breath sounds: Wheezing present.     Comments: Fair air movement Chest:  Chest wall: No tenderness.  Abdominal:     General: Bowel sounds are normal.     Palpations: Abdomen is soft.  Musculoskeletal:     Cervical back: Neck supple.     Right lower leg: No tenderness. No edema.     Left lower leg: No tenderness. No edema.  Skin:    General: Skin is warm and dry.  Neurological:     Mental Status: She is alert and oriented to person, place, and time.  Psychiatric:        Mood and Affect: Mood normal.     ED Results / Procedures / Treatments   Labs (all labs ordered are listed, but only abnormal results are displayed) Labs Reviewed  CBC - Abnormal; Notable for the following components:      Result Value   RBC 3.82 (*)    All other components within normal limits  BASIC METABOLIC PANEL - Abnormal; Notable for the following components:   Potassium 3.2 (*)    Chloride 97 (*)    Glucose, Bld 161 (*)    Creatinine, Ser 1.19 (*)    GFR, Estimated 46 (*)    All other components within normal limits  RESPIRATORY PANEL BY RT PCR (FLU A&B, COVID)    EKG EKG Interpretation  Date/Time:  Friday October 23 2019 14:39:18 EDT Ventricular Rate:  92 PR Interval:  182 QRS Duration: 122 QT Interval:  386 QTC Calculation: 477 R Axis:   -55 Text Interpretation: Normal sinus rhythm Right bundle branch block Left anterior fascicular block Bifascicular block  Abnormal ECG Confirmed by Ross Marcus ([id number]) on 10/24/2019 9:25:41  AM   Radiology DG Chest Portable 1 View  Result Date: 10/23/2019 CLINICAL DATA:  Cough, shortness of breath. EXAM: PORTABLE CHEST 1 VIEW COMPARISON:  08/22/2018 chest radiograph and prior. FINDINGS: Patchy bibasilar opacities. No pneumothorax or pleural effusion. Cardiomediastinal silhouette within normal limits. No acute osseous abnormality. Multilevel spondylosis. IMPRESSION: Bibasilar opacities, which could reflect atelectasis or infection. Electronically Signed   By: Stana Bunting M.D.   On: 10/23/2019 16:10    Procedures Procedures (including critical care time)   CRITICAL CARE Performed by: Shon Baton   Total critical care time: 45 minutes  Critical care time was exclusive of separately billable procedures and treating other patients.  Critical care was necessary to treat or prevent imminent or life-threatening deterioration.  Critical care was time spent personally by me on the following activities: development of treatment plan with patient and/or surrogate as well as nursing, discussions with consultants, evaluation of patient's response to treatment, examination of patient, obtaining history from patient or surrogate, ordering and performing treatments and interventions, ordering and review of laboratory studies, ordering and review of radiographic studies, pulse oximetry and re-evaluation of patient's condition.  Medications Ordered in ED Medications  albuterol (VENTOLIN HFA) 108 (90 Base) MCG/ACT inhaler 4 puff (4 puffs Inhalation Given 10/24/19 0813)  dexamethasone (DECADRON) injection 10 mg (10 mg Intravenous Given 10/24/19 0814)  ipratropium-albuterol (DUONEB) 0.5-2.5 (3) MG/3ML nebulizer solution 3 mL (3 mLs Nebulization Given 10/24/19 1022)  doxycycline (VIBRA-TABS) tablet 100 mg (100 mg Oral Given 10/24/19 1021)  ipratropium-albuterol (DUONEB) 0.5-2.5 (3) MG/3ML nebulizer solution 3 mL (3 mLs Nebulization Given 10/24/19 1239)    ED Course  I have  reviewed the triage vital signs and the nursing notes.  Pertinent labs & imaging results that were available during my care of the patient were reviewed by me and considered in my medical decision making (see  chart for details).  Clinical Course as of Oct 23 1329  Sat Oct 24, 2019  0900 Patient Covid negative.  On reassessment, she continues to have wheezing and desaturates while sleeping.  She was given a DuoNeb and doxycycline for presumed pneumonia.  Will reevaluate.   [CH]  1112 Patient is now awake and eating.  She appears much more comfortable.  She reports she feels significantly improved and wheezing is improved on exam.  Will ambulate with pulse ox.   [CH]  1112 Basic metabolic panel(!) [CH]  1200 Patient ambulated with pulse ox.  Maintain O2 sats greater than 89%.  She did become more dyspneic.  Discussed with her options including observation admission for frequent duo nebs versus continued treatment in the ED with an attempt to get her home.  Patient would like to go home if at all possible.  I feel the majority of her symptoms are related to wheezing as she continues to have some wheezing on exam although her aeration has generally improved.  We will reassess after an additional DuoNeb.   [CH]  1331 Patient states she feels much better.  O2 sats 100% on my evaluation.  Discussed with her frequent nebulizer treatment over the next 6 to 8 hours every 2 hours given ongoing wheezing and marginal O2 sats.  She will be given a prescription for doxycycline.  Decadron on board.  She was given strict return precautions.   [CH]    Clinical Course User Index [CH] Tyqwan Pink, Mayer Maskerourtney F, MD   MDM Rules/Calculators/A&P                          Patient presents with shortness of breath.  She is overall nontoxic-appearing and vital signs are largely reassuring.  She is wheezing on exam but appears comfortable and without respiratory distress.  Given loss of sense of taste and smell, will obtain a  COVID-19 test.  Chest x-ray reviewed by myself and shows bibasilar opacities which could reflect pneumonia.  Lab work reviewed from triage.  No significant leukocytosis.  No significant metabolic derangement.  Generally mild AKI with creatinine 1.19.  Baseline appears closer to 0.9-1.  She is not having any chest pain and EKG shows no evidence of acute ischemia or arrhythmia.  Doubt ACS.  See clinical course above.  Patient with incremental improvement.  Covid testing is negative.  She is fully vaccinated.  Will treat symptomatically and discharged with a prescription for doxycycline.  After history, exam, and medical workup I feel the patient has been appropriately medically screened and is safe for discharge home. Pertinent diagnoses were discussed with the patient. Patient was given return precautions.  Final Clinical Impression(s) / ED Diagnoses Final diagnoses:  Community acquired pneumonia, unspecified laterality  Mild intermittent asthma with acute exacerbation    Rx / DC Orders ED Discharge Orders         Ordered    doxycycline (VIBRAMYCIN) 100 MG capsule  2 times daily        10/24/19 1110           Asra Gambrel, Mayer Maskerourtney F, MD 10/24/19 1117    Shon BatonHorton, Bellamia Ferch F, MD 10/24/19 1331

## 2019-10-24 NOTE — ED Notes (Signed)
Ambulated pt 20 feet in hall.  SpO2 dropped from 95% at rest to 89-90% while ambulating.   HR increased from 90 to 110.  RR increased from 20 to 30-34.  Pt unable to speak d/t dyspnea, denies dizziness, CP. Upon return to bed SpO2 recovered to 92-93%RA within 3-5 minutes.  Dr. Wilkie Aye made aware.

## 2019-10-24 NOTE — ED Notes (Signed)
Dr. Horton at bedside. 

## 2019-10-24 NOTE — Discharge Instructions (Addendum)
You were seen today for shortness of breath.  This is likely a combination of your asthma and early pneumonia.  Take antibiotics as prescribed.  Use your inhaler every 4 hours as needed for wheezing.  Additionally, you were given steroids while in the emergency room.  These will help you over the next 12 to 24 hours.  If you develop any new or worsening symptoms you should be reevaluated.

## 2019-11-16 ENCOUNTER — Ambulatory Visit
Admission: RE | Admit: 2019-11-16 | Discharge: 2019-11-16 | Disposition: A | Discharge status: Home / Self Care | Payer: Medicare Other | Source: Ambulatory Visit | Attending: Internal Medicine | Admitting: Internal Medicine

## 2019-11-16 ENCOUNTER — Other Ambulatory Visit: Payer: Self-pay | Admitting: Internal Medicine

## 2019-11-16 DIAGNOSIS — Z8701 Personal history of pneumonia (recurrent): Secondary | ICD-10-CM

## 2020-02-02 DIAGNOSIS — M79643 Pain in unspecified hand: Secondary | ICD-10-CM | POA: Diagnosis not present

## 2020-02-02 DIAGNOSIS — M545 Low back pain, unspecified: Secondary | ICD-10-CM | POA: Diagnosis not present

## 2020-02-02 DIAGNOSIS — M549 Dorsalgia, unspecified: Secondary | ICD-10-CM | POA: Diagnosis not present

## 2020-02-02 DIAGNOSIS — Z79899 Other long term (current) drug therapy: Secondary | ICD-10-CM | POA: Diagnosis not present

## 2020-02-02 DIAGNOSIS — M0579 Rheumatoid arthritis with rheumatoid factor of multiple sites without organ or systems involvement: Secondary | ICD-10-CM | POA: Diagnosis not present

## 2020-02-02 DIAGNOSIS — M1990 Unspecified osteoarthritis, unspecified site: Secondary | ICD-10-CM | POA: Diagnosis not present

## 2020-02-04 DIAGNOSIS — H2513 Age-related nuclear cataract, bilateral: Secondary | ICD-10-CM | POA: Diagnosis not present

## 2020-02-04 DIAGNOSIS — H18413 Arcus senilis, bilateral: Secondary | ICD-10-CM | POA: Diagnosis not present

## 2020-02-04 DIAGNOSIS — H2511 Age-related nuclear cataract, right eye: Secondary | ICD-10-CM | POA: Diagnosis not present

## 2020-02-04 DIAGNOSIS — H43822 Vitreomacular adhesion, left eye: Secondary | ICD-10-CM | POA: Diagnosis not present

## 2020-02-04 DIAGNOSIS — H40023 Open angle with borderline findings, high risk, bilateral: Secondary | ICD-10-CM | POA: Diagnosis not present

## 2020-02-04 DIAGNOSIS — H25043 Posterior subcapsular polar age-related cataract, bilateral: Secondary | ICD-10-CM | POA: Diagnosis not present

## 2020-02-05 DIAGNOSIS — E7800 Pure hypercholesterolemia, unspecified: Secondary | ICD-10-CM | POA: Diagnosis not present

## 2020-02-05 DIAGNOSIS — J4521 Mild intermittent asthma with (acute) exacerbation: Secondary | ICD-10-CM | POA: Diagnosis not present

## 2020-02-05 DIAGNOSIS — I10 Essential (primary) hypertension: Secondary | ICD-10-CM | POA: Diagnosis not present

## 2020-02-05 DIAGNOSIS — J4520 Mild intermittent asthma, uncomplicated: Secondary | ICD-10-CM | POA: Diagnosis not present

## 2020-02-05 DIAGNOSIS — E1169 Type 2 diabetes mellitus with other specified complication: Secondary | ICD-10-CM | POA: Diagnosis not present

## 2020-02-05 DIAGNOSIS — M179 Osteoarthritis of knee, unspecified: Secondary | ICD-10-CM | POA: Diagnosis not present

## 2020-02-05 DIAGNOSIS — M0579 Rheumatoid arthritis with rheumatoid factor of multiple sites without organ or systems involvement: Secondary | ICD-10-CM | POA: Diagnosis not present

## 2020-02-05 DIAGNOSIS — K219 Gastro-esophageal reflux disease without esophagitis: Secondary | ICD-10-CM | POA: Diagnosis not present

## 2020-02-05 DIAGNOSIS — M1711 Unilateral primary osteoarthritis, right knee: Secondary | ICD-10-CM | POA: Diagnosis not present

## 2020-02-23 DIAGNOSIS — M0579 Rheumatoid arthritis with rheumatoid factor of multiple sites without organ or systems involvement: Secondary | ICD-10-CM | POA: Diagnosis not present

## 2020-02-23 DIAGNOSIS — M064 Inflammatory polyarthropathy: Secondary | ICD-10-CM | POA: Diagnosis not present

## 2020-02-23 DIAGNOSIS — M1990 Unspecified osteoarthritis, unspecified site: Secondary | ICD-10-CM | POA: Diagnosis not present

## 2020-03-02 DIAGNOSIS — H2513 Age-related nuclear cataract, bilateral: Secondary | ICD-10-CM | POA: Diagnosis not present

## 2020-03-02 DIAGNOSIS — H2511 Age-related nuclear cataract, right eye: Secondary | ICD-10-CM | POA: Diagnosis not present

## 2020-03-03 DIAGNOSIS — H2512 Age-related nuclear cataract, left eye: Secondary | ICD-10-CM | POA: Diagnosis not present

## 2020-03-07 DIAGNOSIS — E7800 Pure hypercholesterolemia, unspecified: Secondary | ICD-10-CM | POA: Diagnosis not present

## 2020-03-07 DIAGNOSIS — J4520 Mild intermittent asthma, uncomplicated: Secondary | ICD-10-CM | POA: Diagnosis not present

## 2020-03-07 DIAGNOSIS — M0579 Rheumatoid arthritis with rheumatoid factor of multiple sites without organ or systems involvement: Secondary | ICD-10-CM | POA: Diagnosis not present

## 2020-03-07 DIAGNOSIS — I10 Essential (primary) hypertension: Secondary | ICD-10-CM | POA: Diagnosis not present

## 2020-03-07 DIAGNOSIS — M179 Osteoarthritis of knee, unspecified: Secondary | ICD-10-CM | POA: Diagnosis not present

## 2020-03-07 DIAGNOSIS — E1169 Type 2 diabetes mellitus with other specified complication: Secondary | ICD-10-CM | POA: Diagnosis not present

## 2020-03-07 DIAGNOSIS — K219 Gastro-esophageal reflux disease without esophagitis: Secondary | ICD-10-CM | POA: Diagnosis not present

## 2020-03-07 DIAGNOSIS — J4521 Mild intermittent asthma with (acute) exacerbation: Secondary | ICD-10-CM | POA: Diagnosis not present

## 2020-03-15 DIAGNOSIS — I10 Essential (primary) hypertension: Secondary | ICD-10-CM | POA: Diagnosis not present

## 2020-03-15 DIAGNOSIS — K219 Gastro-esophageal reflux disease without esophagitis: Secondary | ICD-10-CM | POA: Diagnosis not present

## 2020-03-15 DIAGNOSIS — E1169 Type 2 diabetes mellitus with other specified complication: Secondary | ICD-10-CM | POA: Diagnosis not present

## 2020-03-15 DIAGNOSIS — J4520 Mild intermittent asthma, uncomplicated: Secondary | ICD-10-CM | POA: Diagnosis not present

## 2020-03-16 DIAGNOSIS — H2512 Age-related nuclear cataract, left eye: Secondary | ICD-10-CM | POA: Diagnosis not present

## 2020-04-02 ENCOUNTER — Encounter (HOSPITAL_COMMUNITY): Payer: Self-pay

## 2020-04-02 ENCOUNTER — Other Ambulatory Visit: Payer: Self-pay

## 2020-04-02 ENCOUNTER — Ambulatory Visit (HOSPITAL_COMMUNITY)
Admission: EM | Admit: 2020-04-02 | Discharge: 2020-04-02 | Disposition: A | Discharge status: Home / Self Care | Payer: Medicare Other | Attending: Student | Admitting: Student

## 2020-04-02 DIAGNOSIS — E118 Type 2 diabetes mellitus with unspecified complications: Secondary | ICD-10-CM | POA: Diagnosis not present

## 2020-04-02 DIAGNOSIS — Z7984 Long term (current) use of oral hypoglycemic drugs: Secondary | ICD-10-CM

## 2020-04-02 DIAGNOSIS — J301 Allergic rhinitis due to pollen: Secondary | ICD-10-CM

## 2020-04-02 DIAGNOSIS — E119 Type 2 diabetes mellitus without complications: Secondary | ICD-10-CM

## 2020-04-02 DIAGNOSIS — J4521 Mild intermittent asthma with (acute) exacerbation: Secondary | ICD-10-CM

## 2020-04-02 MED ORDER — CETIRIZINE HCL 10 MG PO TABS: 10.0000 mg | ORAL_TABLET | Freq: Every day | ORAL | 2 refills | Status: DC

## 2020-04-02 MED ORDER — PREDNISONE 20 MG PO TABS: 40.0000 mg | ORAL_TABLET | Freq: Every day | ORAL | 0 refills | Status: AC

## 2020-04-02 NOTE — Discharge Instructions (Addendum)
-  For your asthma, start the prednisone.  Take 2 pills by mouth daily for 5 days.  Try taking this earlier in the day as this can give you energy. -Keep checking your sugars at home.  If your fasting sugars are over 140, stop the prednisone and follow-up with [place] or your primary care if you are still having symptoms. -Continue albuterol inhaler as needed for shortness of breath and wheezing. -Try the allergy medication-Zyrtec (cetirizine), 1 pill daily.  Try this for about a week, if it is helping you can continue it longer. -Seek additional immediate medical attention if your shortness of breath gets worse instead of better despite treatment, if you develop chest pain, dizziness, new fever/chills, etc.

## 2020-04-02 NOTE — ED Triage Notes (Addendum)
Pt in with c/o sob, wheezing and coughing that started last night.  Pt states she used her inhaler with minimal relief  Lung sound Clear upon auscultation

## 2020-04-02 NOTE — ED Provider Notes (Signed)
[facility]    CSN: [id number] Arrival date & time: 04/02/20  1637      History   Chief Complaint Chief Complaint  Patient presents with  . Shortness of Breath    HPI Isabella Oliver is a 70 y.o. female presenting with asthma attack x1 day.  History of asthma that is typically well controlled on albuterol. History allergic rhinitis that is currently poorly controlled on no medications. Also with history of heart murmur, mild aortic stenosis, hypertension, hyperlipidemia, GERD, diabetes, allergies.  Endorses shortness of breath, wheezing x1 day. nasal congestion for about 1 month.  Epidural provides relief for about 4 hours but then shortness of breath and wheezing returns. occ dry cough. Denies fevers/chills, n/v/d, chest pain, , facial pain, teeth pain, headaches, sore throat, loss of taste/smell, swollen lymph nodes, ear pain. Fasting sugars running 110 nonfasting sugars running 150s. Adamantly denies any chest pain, dizziness.  HPI  Past Medical History:  Diagnosis Date  . Allergy   . Arthritis   . Asthma   . Diabetes mellitus without complication (HCC)   . GERD (gastroesophageal reflux disease)   . Heart murmur    [facility]  . History of carpal tunnel syndrome    Bilateral  . History of iron deficiency anemia   . Hyperlipidemia   . Hypertension   . Pneumonia     Patient Active Problem List   Diagnosis Date Noted  . Uncontrolled hypertension   . Constipation   . Primary osteoarthritis of right knee 08/29/2018  . Pre-operative cardiovascular examination 07/17/2018  . Mild aortic stenosis 07/17/2018  . Primary osteoarthritis of left knee 02/20/2016  . Arthritis 10/30/2013  . Type 2 diabetes mellitus without complication (HCC) 10/30/2013  . Essential hypertension 10/30/2013  . Hyperlipidemia 10/30/2013    Past Surgical History:  Procedure Laterality Date  . BILATERAL CARPAL TUNNEL RELEASE    . COLONOSCOPY    . TOTAL  KNEE ARTHROPLASTY Left 02/20/2016   Procedure: TOTAL KNEE ARTHROPLASTY;  Surgeon: Jodi GeraldsJohn Graves, MD;  Location: [facility];  Service: Orthopedics;  Laterality: Left;  . TOTAL KNEE ARTHROPLASTY Right 08/29/2018   Procedure: TOTAL KNEE ARTHROPLASTY;  Surgeon: Jodi GeraldsGraves, John, MD;  Location: [facility];  Service: Orthopedics;  Laterality: Right;  . TUBAL LIGATION    . TUBAL LIGATION      OB History   No obstetric history on file.      Home Medications    Prior to Admission medications   Medication Sig Start Date End Date Taking? Authorizing Provider  cetirizine (ZYRTEC ALLERGY) 10 MG tablet Take 1 tablet (10 mg total) by mouth daily for 30 doses. 04/02/20 05/02/20 Yes Rhys MartiniGraham, Mara Favero E, PA-C  predniSONE (DELTASONE) 20 MG tablet Take 2 tablets (40 mg total) by mouth daily for 5 days. 04/02/20 04/07/20 Yes Rhys MartiniGraham, Charnele Semple E, PA-C  acetaminophen (TYLENOL) 500 MG tablet Take 1,000 mg by mouth every 6 (six) hours as needed for moderate pain.    [provider]  albuterol (PROVENTIL HFA;VENTOLIN HFA) 108 (90 Base) MCG/ACT inhaler Inhale 1-2 puffs into the lungs every 6 (six) hours as needed for wheezing or shortness of breath.    [provider]  amLODipine (NORVASC) 10 MG tablet Take 10 mg by mouth daily.    [provider]  aspirin EC 325 MG tablet Take 1 tablet (325 mg total) by mouth 2 (two) times daily after a meal. Take x 1 month post op to decrease risk of blood  clots. 08/29/18   Marshia Ly, PA-C  betamethasone dipropionate 0.05 % lotion Apply 1 application topically daily as needed (scalp irritation).    [provider]  chlorthalidone (HYGROTON) 50 MG tablet Take 2 tablets (100 mg total) by mouth daily. 09/01/18   Rodolph Bong, MD  Cholecalciferol (VITAMIN D3) 50 MCG (2000 UT) TABS Take 2,000 Units by mouth daily.     [provider]  Ciclopirox 1 % shampoo Apply 1 application topically 2 (two) times a week.    [provider]  doxycycline  (VIBRAMYCIN) 100 MG capsule Take 1 capsule (100 mg total) by mouth 2 (two) times daily. 10/24/19   Horton, Mayer Masker, MD  empagliflozin (JARDIANCE) 25 MG TABS tablet Take 25 mg by mouth daily.    [provider]  ezetimibe (ZETIA) 10 MG tablet Take 10 mg by mouth daily.    [provider]  glimepiride (AMARYL) 4 MG tablet Take 2 mg by mouth daily with breakfast.     [provider]  losartan (COZAAR) 100 MG tablet Take 100 mg by mouth daily.    [provider]  oxyCODONE-acetaminophen (PERCOCET/ROXICET) 5-325 MG tablet Take 1-2 tablets by mouth every 6 (six) hours as needed for severe pain. 08/29/18   Marshia Ly, PA-C  pantoprazole (PROTONIX) 40 MG tablet Take 40 mg by mouth daily.    [provider]  potassium chloride (MICRO-K) 10 MEQ CR capsule Take 10 mEq by mouth daily.    [provider]  senna-docusate (SENOKOT-S) 8.6-50 MG tablet Take 2 tablets by mouth 2 (two) times daily. 08/31/18   Rodolph Bong, MD  tiZANidine (ZANAFLEX) 2 MG tablet Take 1 tablet (2 mg total) by mouth every 8 (eight) hours as needed for muscle spasms. 08/29/18   Marshia Ly, PA-C    Family History Family History  Problem Relation Age of Onset  . Lung disease Mother   . Hypertension Father   . Cancer Sister   . Diabetes Sister   . Cancer Maternal Grandmother   . Cancer Maternal Grandfather     Social History Social History   Tobacco Use  . Smoking status: Former Smoker    Packs/day: 0.50    Years: 20.00    Pack years: 10.00    Types: Cigarettes    Quit date: 2001    Years since quitting: 21.2  . Smokeless tobacco: Never Used  Vaping Use  . Vaping Use: Never used  Substance Use Topics  . Alcohol use: Not Currently    Comment: seldom  . Drug use: No     Allergies   Food, Codeine, Methocarbamol, Tramadol, and Voltaren [diclofenac sodium]   Review of Systems Review of Systems  Constitutional: Negative for appetite change, chills  and fever.  HENT: Negative for congestion, ear pain, rhinorrhea, sinus pressure, sinus pain and sore throat.   Eyes: Negative for redness and visual disturbance.  Respiratory: Positive for shortness of breath and wheezing. Negative for cough and chest tightness.   Cardiovascular: Negative for chest pain and palpitations.  Gastrointestinal: Negative for abdominal pain, constipation, diarrhea, nausea and vomiting.  Genitourinary: Negative for dysuria, frequency and urgency.  Musculoskeletal: Negative for myalgias.  Neurological: Negative for dizziness, weakness and headaches.  Psychiatric/Behavioral: Negative for confusion.  All other systems reviewed and are negative.    Physical Exam Triage Vital Signs ED Triage Vitals  Enc Vitals Group     BP      Pulse      Resp  Temp      Temp src      SpO2      Weight      Height      Head Circumference      Peak Flow      Pain Score      Pain Loc      Pain Edu?      Excl. in GC?    No data found.  Updated Vital Signs BP (!) 155/68   Pulse 75   Temp 99.6 F (37.6 C)   Resp (!) 22   SpO2 97%   Visual Acuity Right Eye Distance:   Left Eye Distance:   Bilateral Distance:    Right Eye Near:   Left Eye Near:    Bilateral Near:     Physical Exam Vitals reviewed.  Constitutional:      General: She is not in acute distress.    Appearance: Normal appearance. She is not ill-appearing.  HENT:     Head: Normocephalic and atraumatic.     Right Ear: Hearing, tympanic membrane, ear canal and external ear normal. No swelling or tenderness. There is no impacted cerumen. No mastoid tenderness. Tympanic membrane is not perforated, erythematous, retracted or bulging.     Left Ear: Hearing, tympanic membrane, ear canal and external ear normal. No swelling or tenderness. There is no impacted cerumen. No mastoid tenderness. Tympanic membrane is not perforated, erythematous, retracted or bulging.     Nose:     Right Sinus: No maxillary  sinus tenderness or frontal sinus tenderness.     Left Sinus: No maxillary sinus tenderness or frontal sinus tenderness.     Mouth/Throat:     Mouth: Mucous membranes are moist.     Pharynx: Uvula midline. No oropharyngeal exudate or posterior oropharyngeal erythema.     Tonsils: No tonsillar exudate.  Cardiovascular:     Rate and Rhythm: Normal rate and regular rhythm.     Heart sounds: Normal heart sounds.  Pulmonary:     Effort: Tachypnea present. No accessory muscle usage, prolonged expiration or respiratory distress.     Breath sounds: Normal air entry. Decreased breath sounds present. No wheezing, rhonchi or rales.     Comments: Decreased breath sounds throughout Chest:     Chest wall: No tenderness.  Abdominal:     General: Abdomen is flat. Bowel sounds are normal.     Tenderness: There is no abdominal tenderness. There is no guarding or rebound.  Lymphadenopathy:     Cervical: No cervical adenopathy.  Skin:    Capillary Refill: Capillary refill takes less than 2 seconds.  Neurological:     General: No focal deficit present.     Mental Status: She is alert and oriented to person, place, and time.  Psychiatric:        Attention and Perception: Attention and perception normal.        Mood and Affect: Mood and affect normal.        Behavior: Behavior normal. Behavior is cooperative.        Thought Content: Thought content normal.        Judgment: Judgment normal.      UC Treatments / Results  Labs (all labs ordered are listed, but only abnormal results are displayed) Labs Reviewed - No data to display  EKG   Radiology No results found.  Procedures Procedures (including critical care time)  Medications Ordered in UC Medications - No data to display  Initial Impression /  Assessment and Plan / UC Course  I have reviewed the triage vital signs and the nursing notes.  Pertinent labs & imaging results that were available during my care of the patient were reviewed  by me and considered in my medical decision making (see chart for details).     This patient is a 70 year old female presenting with asthma exacerbation due to seasonal allergies. Today this pt is mildly tachypneic at 22 but afebrile nontachycardic , oxygenating well on room air, decreased breath sounds throughout but no wheezes rhonchi or rales. Appears comfortable.  This patient is a diabetic, fasting sugars 110s and nonfasting sugars running 150s at home. Continue glimepiride and jardiance.  Plan to treat with short course of prednisone as below. She understands to check her sugars daily while on this medication. If they rise >140 fasting, stop prednisone and f/u with [place] or PCP.   Continue albuterol inhaler prn.   Trial of zyrtec for allergic rhinitis component.   Strict return precautions discussed.  This chart was dictated using voice recognition software, Dragon. Despite the best efforts of this provider to proofread and correct errors, errors may still occur which can change documentation meaning.  Final Clinical Impressions(s) / UC Diagnoses   Final diagnoses:  Mild intermittent asthma with acute exacerbation  Type 2 diabetes mellitus without complication, without long-term current use of insulin (HCC)  Diabetes mellitus treated with oral medication (HCC)  Seasonal allergic rhinitis due to pollen     Discharge Instructions     -For your asthma, start the prednisone.  Take 2 pills by mouth daily for 5 days.  Try taking this earlier in the day as this can give you energy. -Keep checking your sugars at home.  If your fasting sugars are over 140, stop the prednisone and follow-up with [place] or your primary care if you are still having symptoms. -Continue albuterol inhaler as needed for shortness of breath and wheezing. -Try the allergy medication-Zyrtec (cetirizine), 1 pill daily.  Try this for about a week, if it is helping you can continue it longer. -Seek additional immediate  medical attention if your shortness of breath gets worse instead of better despite treatment, if you develop chest pain, dizziness, new fever/chills, etc.    ED Prescriptions    Medication Sig Dispense Auth. Provider   predniSONE (DELTASONE) 20 MG tablet Take 2 tablets (40 mg total) by mouth daily for 5 days. 10 tablet Rhys Martini, PA-C   cetirizine (ZYRTEC ALLERGY) 10 MG tablet Take 1 tablet (10 mg total) by mouth daily for 30 doses. 30 tablet Rhys Martini, PA-C     PDMP not reviewed this encounter.   Rhys Martini, PA-C 04/02/20 1709

## 2020-04-04 DIAGNOSIS — R059 Cough, unspecified: Secondary | ICD-10-CM | POA: Diagnosis not present

## 2020-04-04 DIAGNOSIS — M1990 Unspecified osteoarthritis, unspecified site: Secondary | ICD-10-CM | POA: Diagnosis not present

## 2020-04-04 DIAGNOSIS — Z79899 Other long term (current) drug therapy: Secondary | ICD-10-CM | POA: Diagnosis not present

## 2020-04-04 DIAGNOSIS — M0579 Rheumatoid arthritis with rheumatoid factor of multiple sites without organ or systems involvement: Secondary | ICD-10-CM | POA: Diagnosis not present

## 2020-04-04 DIAGNOSIS — M79643 Pain in unspecified hand: Secondary | ICD-10-CM | POA: Diagnosis not present

## 2020-04-04 DIAGNOSIS — M549 Dorsalgia, unspecified: Secondary | ICD-10-CM | POA: Diagnosis not present

## 2020-04-07 DIAGNOSIS — M179 Osteoarthritis of knee, unspecified: Secondary | ICD-10-CM | POA: Diagnosis not present

## 2020-04-07 DIAGNOSIS — K219 Gastro-esophageal reflux disease without esophagitis: Secondary | ICD-10-CM | POA: Diagnosis not present

## 2020-04-07 DIAGNOSIS — I10 Essential (primary) hypertension: Secondary | ICD-10-CM | POA: Diagnosis not present

## 2020-04-07 DIAGNOSIS — E7800 Pure hypercholesterolemia, unspecified: Secondary | ICD-10-CM | POA: Diagnosis not present

## 2020-04-07 DIAGNOSIS — M0579 Rheumatoid arthritis with rheumatoid factor of multiple sites without organ or systems involvement: Secondary | ICD-10-CM | POA: Diagnosis not present

## 2020-04-07 DIAGNOSIS — J4520 Mild intermittent asthma, uncomplicated: Secondary | ICD-10-CM | POA: Diagnosis not present

## 2020-04-07 DIAGNOSIS — E1169 Type 2 diabetes mellitus with other specified complication: Secondary | ICD-10-CM | POA: Diagnosis not present

## 2020-04-07 DIAGNOSIS — J4521 Mild intermittent asthma with (acute) exacerbation: Secondary | ICD-10-CM | POA: Diagnosis not present

## 2020-04-07 DIAGNOSIS — M1711 Unilateral primary osteoarthritis, right knee: Secondary | ICD-10-CM | POA: Diagnosis not present

## 2020-05-05 DIAGNOSIS — M1711 Unilateral primary osteoarthritis, right knee: Secondary | ICD-10-CM | POA: Diagnosis not present

## 2020-05-05 DIAGNOSIS — E1169 Type 2 diabetes mellitus with other specified complication: Secondary | ICD-10-CM | POA: Diagnosis not present

## 2020-05-05 DIAGNOSIS — J4521 Mild intermittent asthma with (acute) exacerbation: Secondary | ICD-10-CM | POA: Diagnosis not present

## 2020-05-05 DIAGNOSIS — I10 Essential (primary) hypertension: Secondary | ICD-10-CM | POA: Diagnosis not present

## 2020-05-05 DIAGNOSIS — E7800 Pure hypercholesterolemia, unspecified: Secondary | ICD-10-CM | POA: Diagnosis not present

## 2020-05-05 DIAGNOSIS — K219 Gastro-esophageal reflux disease without esophagitis: Secondary | ICD-10-CM | POA: Diagnosis not present

## 2020-05-05 DIAGNOSIS — M0579 Rheumatoid arthritis with rheumatoid factor of multiple sites without organ or systems involvement: Secondary | ICD-10-CM | POA: Diagnosis not present

## 2020-05-05 DIAGNOSIS — J4520 Mild intermittent asthma, uncomplicated: Secondary | ICD-10-CM | POA: Diagnosis not present

## 2020-06-07 DIAGNOSIS — M79643 Pain in unspecified hand: Secondary | ICD-10-CM | POA: Diagnosis not present

## 2020-06-07 DIAGNOSIS — M1990 Unspecified osteoarthritis, unspecified site: Secondary | ICD-10-CM | POA: Diagnosis not present

## 2020-06-07 DIAGNOSIS — M549 Dorsalgia, unspecified: Secondary | ICD-10-CM | POA: Diagnosis not present

## 2020-06-07 DIAGNOSIS — Z79899 Other long term (current) drug therapy: Secondary | ICD-10-CM | POA: Diagnosis not present

## 2020-06-07 DIAGNOSIS — M0579 Rheumatoid arthritis with rheumatoid factor of multiple sites without organ or systems involvement: Secondary | ICD-10-CM | POA: Diagnosis not present

## 2020-06-07 DIAGNOSIS — R700 Elevated erythrocyte sedimentation rate: Secondary | ICD-10-CM | POA: Diagnosis not present

## 2020-07-06 DIAGNOSIS — I10 Essential (primary) hypertension: Secondary | ICD-10-CM | POA: Diagnosis not present

## 2020-07-06 DIAGNOSIS — M179 Osteoarthritis of knee, unspecified: Secondary | ICD-10-CM | POA: Diagnosis not present

## 2020-07-06 DIAGNOSIS — M1711 Unilateral primary osteoarthritis, right knee: Secondary | ICD-10-CM | POA: Diagnosis not present

## 2020-07-06 DIAGNOSIS — E1169 Type 2 diabetes mellitus with other specified complication: Secondary | ICD-10-CM | POA: Diagnosis not present

## 2020-07-06 DIAGNOSIS — M0579 Rheumatoid arthritis with rheumatoid factor of multiple sites without organ or systems involvement: Secondary | ICD-10-CM | POA: Diagnosis not present

## 2020-07-06 DIAGNOSIS — J4521 Mild intermittent asthma with (acute) exacerbation: Secondary | ICD-10-CM | POA: Diagnosis not present

## 2020-07-06 DIAGNOSIS — E7800 Pure hypercholesterolemia, unspecified: Secondary | ICD-10-CM | POA: Diagnosis not present

## 2020-07-06 DIAGNOSIS — K219 Gastro-esophageal reflux disease without esophagitis: Secondary | ICD-10-CM | POA: Diagnosis not present

## 2020-07-06 DIAGNOSIS — J4520 Mild intermittent asthma, uncomplicated: Secondary | ICD-10-CM | POA: Diagnosis not present

## 2020-07-07 DIAGNOSIS — L110 Acquired keratosis follicularis: Secondary | ICD-10-CM | POA: Diagnosis not present

## 2020-07-07 DIAGNOSIS — M79672 Pain in left foot: Secondary | ICD-10-CM | POA: Diagnosis not present

## 2020-07-07 DIAGNOSIS — E1140 Type 2 diabetes mellitus with diabetic neuropathy, unspecified: Secondary | ICD-10-CM | POA: Diagnosis not present

## 2020-07-07 DIAGNOSIS — M79675 Pain in left toe(s): Secondary | ICD-10-CM | POA: Diagnosis not present

## 2020-07-07 DIAGNOSIS — M79671 Pain in right foot: Secondary | ICD-10-CM | POA: Diagnosis not present

## 2020-07-07 DIAGNOSIS — M79674 Pain in right toe(s): Secondary | ICD-10-CM | POA: Diagnosis not present

## 2020-07-07 DIAGNOSIS — I739 Peripheral vascular disease, unspecified: Secondary | ICD-10-CM | POA: Diagnosis not present

## 2020-07-19 DIAGNOSIS — Z1389 Encounter for screening for other disorder: Secondary | ICD-10-CM | POA: Diagnosis not present

## 2020-07-19 DIAGNOSIS — I700 Atherosclerosis of aorta: Secondary | ICD-10-CM | POA: Diagnosis not present

## 2020-07-19 DIAGNOSIS — I10 Essential (primary) hypertension: Secondary | ICD-10-CM | POA: Diagnosis not present

## 2020-07-19 DIAGNOSIS — E1169 Type 2 diabetes mellitus with other specified complication: Secondary | ICD-10-CM | POA: Diagnosis not present

## 2020-07-19 DIAGNOSIS — Z789 Other specified health status: Secondary | ICD-10-CM | POA: Diagnosis not present

## 2020-07-19 DIAGNOSIS — Z0000 Encounter for general adult medical examination without abnormal findings: Secondary | ICD-10-CM | POA: Diagnosis not present

## 2020-07-19 DIAGNOSIS — J4521 Mild intermittent asthma with (acute) exacerbation: Secondary | ICD-10-CM | POA: Diagnosis not present

## 2020-07-19 DIAGNOSIS — G720 Drug-induced myopathy: Secondary | ICD-10-CM | POA: Diagnosis not present

## 2020-07-19 DIAGNOSIS — G4733 Obstructive sleep apnea (adult) (pediatric): Secondary | ICD-10-CM | POA: Diagnosis not present

## 2020-07-19 DIAGNOSIS — E7800 Pure hypercholesterolemia, unspecified: Secondary | ICD-10-CM | POA: Diagnosis not present

## 2020-07-19 DIAGNOSIS — M0579 Rheumatoid arthritis with rheumatoid factor of multiple sites without organ or systems involvement: Secondary | ICD-10-CM | POA: Diagnosis not present

## 2020-07-19 DIAGNOSIS — Z7984 Long term (current) use of oral hypoglycemic drugs: Secondary | ICD-10-CM | POA: Diagnosis not present

## 2020-07-22 ENCOUNTER — Other Ambulatory Visit: Payer: Self-pay | Admitting: Internal Medicine

## 2020-07-22 DIAGNOSIS — Z1231 Encounter for screening mammogram for malignant neoplasm of breast: Secondary | ICD-10-CM

## 2020-07-28 DIAGNOSIS — J4520 Mild intermittent asthma, uncomplicated: Secondary | ICD-10-CM | POA: Diagnosis not present

## 2020-07-28 DIAGNOSIS — K219 Gastro-esophageal reflux disease without esophagitis: Secondary | ICD-10-CM | POA: Diagnosis not present

## 2020-07-28 DIAGNOSIS — I10 Essential (primary) hypertension: Secondary | ICD-10-CM | POA: Diagnosis not present

## 2020-07-28 DIAGNOSIS — M0579 Rheumatoid arthritis with rheumatoid factor of multiple sites without organ or systems involvement: Secondary | ICD-10-CM | POA: Diagnosis not present

## 2020-07-28 DIAGNOSIS — E1169 Type 2 diabetes mellitus with other specified complication: Secondary | ICD-10-CM | POA: Diagnosis not present

## 2020-07-28 DIAGNOSIS — E7800 Pure hypercholesterolemia, unspecified: Secondary | ICD-10-CM | POA: Diagnosis not present

## 2020-07-28 DIAGNOSIS — J4521 Mild intermittent asthma with (acute) exacerbation: Secondary | ICD-10-CM | POA: Diagnosis not present

## 2020-08-08 ENCOUNTER — Other Ambulatory Visit: Payer: Self-pay

## 2020-08-08 ENCOUNTER — Ambulatory Visit
Admission: RE | Admit: 2020-08-08 | Discharge: 2020-08-08 | Disposition: A | Discharge status: Home / Self Care | Payer: Medicare Other | Source: Ambulatory Visit | Attending: Internal Medicine | Admitting: Internal Medicine

## 2020-08-08 DIAGNOSIS — Z1231 Encounter for screening mammogram for malignant neoplasm of breast: Secondary | ICD-10-CM | POA: Diagnosis not present

## 2020-09-01 DIAGNOSIS — L299 Pruritus, unspecified: Secondary | ICD-10-CM | POA: Diagnosis not present

## 2020-09-01 DIAGNOSIS — L309 Dermatitis, unspecified: Secondary | ICD-10-CM | POA: Diagnosis not present

## 2020-09-14 DIAGNOSIS — M1990 Unspecified osteoarthritis, unspecified site: Secondary | ICD-10-CM | POA: Diagnosis not present

## 2020-09-14 DIAGNOSIS — R700 Elevated erythrocyte sedimentation rate: Secondary | ICD-10-CM | POA: Diagnosis not present

## 2020-09-14 DIAGNOSIS — Z79899 Other long term (current) drug therapy: Secondary | ICD-10-CM | POA: Diagnosis not present

## 2020-09-14 DIAGNOSIS — M549 Dorsalgia, unspecified: Secondary | ICD-10-CM | POA: Diagnosis not present

## 2020-09-14 DIAGNOSIS — M0579 Rheumatoid arthritis with rheumatoid factor of multiple sites without organ or systems involvement: Secondary | ICD-10-CM | POA: Diagnosis not present

## 2020-09-14 DIAGNOSIS — M79643 Pain in unspecified hand: Secondary | ICD-10-CM | POA: Diagnosis not present

## 2020-09-29 DIAGNOSIS — M79674 Pain in right toe(s): Secondary | ICD-10-CM | POA: Diagnosis not present

## 2020-09-29 DIAGNOSIS — M79672 Pain in left foot: Secondary | ICD-10-CM | POA: Diagnosis not present

## 2020-09-29 DIAGNOSIS — L110 Acquired keratosis follicularis: Secondary | ICD-10-CM | POA: Diagnosis not present

## 2020-09-29 DIAGNOSIS — M79671 Pain in right foot: Secondary | ICD-10-CM | POA: Diagnosis not present

## 2020-09-29 DIAGNOSIS — M79675 Pain in left toe(s): Secondary | ICD-10-CM | POA: Diagnosis not present

## 2020-09-29 DIAGNOSIS — I739 Peripheral vascular disease, unspecified: Secondary | ICD-10-CM | POA: Diagnosis not present

## 2020-09-29 DIAGNOSIS — E1140 Type 2 diabetes mellitus with diabetic neuropathy, unspecified: Secondary | ICD-10-CM | POA: Diagnosis not present

## 2020-11-07 DIAGNOSIS — I10 Essential (primary) hypertension: Secondary | ICD-10-CM | POA: Diagnosis not present

## 2020-11-07 DIAGNOSIS — J4521 Mild intermittent asthma with (acute) exacerbation: Secondary | ICD-10-CM | POA: Diagnosis not present

## 2020-11-07 DIAGNOSIS — E7800 Pure hypercholesterolemia, unspecified: Secondary | ICD-10-CM | POA: Diagnosis not present

## 2020-11-07 DIAGNOSIS — K219 Gastro-esophageal reflux disease without esophagitis: Secondary | ICD-10-CM | POA: Diagnosis not present

## 2020-11-07 DIAGNOSIS — E1169 Type 2 diabetes mellitus with other specified complication: Secondary | ICD-10-CM | POA: Diagnosis not present

## 2020-11-07 DIAGNOSIS — M0579 Rheumatoid arthritis with rheumatoid factor of multiple sites without organ or systems involvement: Secondary | ICD-10-CM | POA: Diagnosis not present

## 2020-11-07 DIAGNOSIS — M1711 Unilateral primary osteoarthritis, right knee: Secondary | ICD-10-CM | POA: Diagnosis not present

## 2020-11-07 DIAGNOSIS — M179 Osteoarthritis of knee, unspecified: Secondary | ICD-10-CM | POA: Diagnosis not present

## 2020-11-07 DIAGNOSIS — J4520 Mild intermittent asthma, uncomplicated: Secondary | ICD-10-CM | POA: Diagnosis not present

## 2020-11-17 DIAGNOSIS — J4520 Mild intermittent asthma, uncomplicated: Secondary | ICD-10-CM | POA: Diagnosis not present

## 2020-11-17 DIAGNOSIS — M0579 Rheumatoid arthritis with rheumatoid factor of multiple sites without organ or systems involvement: Secondary | ICD-10-CM | POA: Diagnosis not present

## 2020-11-17 DIAGNOSIS — M1711 Unilateral primary osteoarthritis, right knee: Secondary | ICD-10-CM | POA: Diagnosis not present

## 2020-11-17 DIAGNOSIS — K219 Gastro-esophageal reflux disease without esophagitis: Secondary | ICD-10-CM | POA: Diagnosis not present

## 2020-11-17 DIAGNOSIS — I10 Essential (primary) hypertension: Secondary | ICD-10-CM | POA: Diagnosis not present

## 2020-11-17 DIAGNOSIS — E7800 Pure hypercholesterolemia, unspecified: Secondary | ICD-10-CM | POA: Diagnosis not present

## 2020-11-17 DIAGNOSIS — J4521 Mild intermittent asthma with (acute) exacerbation: Secondary | ICD-10-CM | POA: Diagnosis not present

## 2020-11-17 DIAGNOSIS — E1169 Type 2 diabetes mellitus with other specified complication: Secondary | ICD-10-CM | POA: Diagnosis not present

## 2020-12-08 DIAGNOSIS — E1169 Type 2 diabetes mellitus with other specified complication: Secondary | ICD-10-CM | POA: Diagnosis not present

## 2020-12-08 DIAGNOSIS — M0579 Rheumatoid arthritis with rheumatoid factor of multiple sites without organ or systems involvement: Secondary | ICD-10-CM | POA: Diagnosis not present

## 2020-12-08 DIAGNOSIS — I10 Essential (primary) hypertension: Secondary | ICD-10-CM | POA: Diagnosis not present

## 2020-12-08 DIAGNOSIS — Z23 Encounter for immunization: Secondary | ICD-10-CM | POA: Diagnosis not present

## 2020-12-08 DIAGNOSIS — G4733 Obstructive sleep apnea (adult) (pediatric): Secondary | ICD-10-CM | POA: Diagnosis not present

## 2020-12-14 DIAGNOSIS — M1990 Unspecified osteoarthritis, unspecified site: Secondary | ICD-10-CM | POA: Diagnosis not present

## 2020-12-14 DIAGNOSIS — Z79899 Other long term (current) drug therapy: Secondary | ICD-10-CM | POA: Diagnosis not present

## 2020-12-14 DIAGNOSIS — M549 Dorsalgia, unspecified: Secondary | ICD-10-CM | POA: Diagnosis not present

## 2020-12-14 DIAGNOSIS — Z13820 Encounter for screening for osteoporosis: Secondary | ICD-10-CM | POA: Diagnosis not present

## 2020-12-14 DIAGNOSIS — M0579 Rheumatoid arthritis with rheumatoid factor of multiple sites without organ or systems involvement: Secondary | ICD-10-CM | POA: Diagnosis not present

## 2020-12-14 DIAGNOSIS — M79643 Pain in unspecified hand: Secondary | ICD-10-CM | POA: Diagnosis not present

## 2020-12-14 DIAGNOSIS — R700 Elevated erythrocyte sedimentation rate: Secondary | ICD-10-CM | POA: Diagnosis not present

## 2020-12-14 DIAGNOSIS — Z780 Asymptomatic menopausal state: Secondary | ICD-10-CM | POA: Diagnosis not present

## 2020-12-19 DIAGNOSIS — R10814 Left lower quadrant abdominal tenderness: Secondary | ICD-10-CM | POA: Diagnosis not present

## 2020-12-19 DIAGNOSIS — R109 Unspecified abdominal pain: Secondary | ICD-10-CM | POA: Diagnosis not present

## 2020-12-29 DIAGNOSIS — I739 Peripheral vascular disease, unspecified: Secondary | ICD-10-CM | POA: Diagnosis not present

## 2020-12-29 DIAGNOSIS — M79672 Pain in left foot: Secondary | ICD-10-CM | POA: Diagnosis not present

## 2020-12-29 DIAGNOSIS — L110 Acquired keratosis follicularis: Secondary | ICD-10-CM | POA: Diagnosis not present

## 2020-12-29 DIAGNOSIS — M79675 Pain in left toe(s): Secondary | ICD-10-CM | POA: Diagnosis not present

## 2020-12-29 DIAGNOSIS — M79671 Pain in right foot: Secondary | ICD-10-CM | POA: Diagnosis not present

## 2020-12-29 DIAGNOSIS — M79674 Pain in right toe(s): Secondary | ICD-10-CM | POA: Diagnosis not present

## 2021-01-25 DIAGNOSIS — M0579 Rheumatoid arthritis with rheumatoid factor of multiple sites without organ or systems involvement: Secondary | ICD-10-CM | POA: Diagnosis not present

## 2021-01-25 DIAGNOSIS — E7800 Pure hypercholesterolemia, unspecified: Secondary | ICD-10-CM | POA: Diagnosis not present

## 2021-01-25 DIAGNOSIS — J4521 Mild intermittent asthma with (acute) exacerbation: Secondary | ICD-10-CM | POA: Diagnosis not present

## 2021-01-25 DIAGNOSIS — M1711 Unilateral primary osteoarthritis, right knee: Secondary | ICD-10-CM | POA: Diagnosis not present

## 2021-01-25 DIAGNOSIS — J4520 Mild intermittent asthma, uncomplicated: Secondary | ICD-10-CM | POA: Diagnosis not present

## 2021-01-25 DIAGNOSIS — E1169 Type 2 diabetes mellitus with other specified complication: Secondary | ICD-10-CM | POA: Diagnosis not present

## 2021-01-25 DIAGNOSIS — I10 Essential (primary) hypertension: Secondary | ICD-10-CM | POA: Diagnosis not present

## 2021-01-25 DIAGNOSIS — K219 Gastro-esophageal reflux disease without esophagitis: Secondary | ICD-10-CM | POA: Diagnosis not present

## 2021-01-30 DIAGNOSIS — Z23 Encounter for immunization: Secondary | ICD-10-CM | POA: Diagnosis not present

## 2021-03-06 DIAGNOSIS — I10 Essential (primary) hypertension: Secondary | ICD-10-CM | POA: Diagnosis not present

## 2021-03-06 DIAGNOSIS — E1169 Type 2 diabetes mellitus with other specified complication: Secondary | ICD-10-CM | POA: Diagnosis not present

## 2021-03-06 DIAGNOSIS — E7800 Pure hypercholesterolemia, unspecified: Secondary | ICD-10-CM | POA: Diagnosis not present

## 2021-03-16 DIAGNOSIS — M79674 Pain in right toe(s): Secondary | ICD-10-CM | POA: Diagnosis not present

## 2021-03-16 DIAGNOSIS — M79675 Pain in left toe(s): Secondary | ICD-10-CM | POA: Diagnosis not present

## 2021-03-16 DIAGNOSIS — M79671 Pain in right foot: Secondary | ICD-10-CM | POA: Diagnosis not present

## 2021-03-16 DIAGNOSIS — M79672 Pain in left foot: Secondary | ICD-10-CM | POA: Diagnosis not present

## 2021-03-16 DIAGNOSIS — L110 Acquired keratosis follicularis: Secondary | ICD-10-CM | POA: Diagnosis not present

## 2021-03-16 DIAGNOSIS — I739 Peripheral vascular disease, unspecified: Secondary | ICD-10-CM | POA: Diagnosis not present

## 2021-03-20 DIAGNOSIS — M0579 Rheumatoid arthritis with rheumatoid factor of multiple sites without organ or systems involvement: Secondary | ICD-10-CM | POA: Diagnosis not present

## 2021-03-20 DIAGNOSIS — J4520 Mild intermittent asthma, uncomplicated: Secondary | ICD-10-CM | POA: Diagnosis not present

## 2021-03-20 DIAGNOSIS — M1711 Unilateral primary osteoarthritis, right knee: Secondary | ICD-10-CM | POA: Diagnosis not present

## 2021-03-20 DIAGNOSIS — I10 Essential (primary) hypertension: Secondary | ICD-10-CM | POA: Diagnosis not present

## 2021-03-20 DIAGNOSIS — E1169 Type 2 diabetes mellitus with other specified complication: Secondary | ICD-10-CM | POA: Diagnosis not present

## 2021-03-20 DIAGNOSIS — E7800 Pure hypercholesterolemia, unspecified: Secondary | ICD-10-CM | POA: Diagnosis not present

## 2021-04-05 DIAGNOSIS — Z13820 Encounter for screening for osteoporosis: Secondary | ICD-10-CM | POA: Diagnosis not present

## 2021-04-05 DIAGNOSIS — M1990 Unspecified osteoarthritis, unspecified site: Secondary | ICD-10-CM | POA: Diagnosis not present

## 2021-04-05 DIAGNOSIS — M79643 Pain in unspecified hand: Secondary | ICD-10-CM | POA: Diagnosis not present

## 2021-04-05 DIAGNOSIS — R700 Elevated erythrocyte sedimentation rate: Secondary | ICD-10-CM | POA: Diagnosis not present

## 2021-04-05 DIAGNOSIS — Z79899 Other long term (current) drug therapy: Secondary | ICD-10-CM | POA: Diagnosis not present

## 2021-04-05 DIAGNOSIS — M0579 Rheumatoid arthritis with rheumatoid factor of multiple sites without organ or systems involvement: Secondary | ICD-10-CM | POA: Diagnosis not present

## 2021-04-05 DIAGNOSIS — M549 Dorsalgia, unspecified: Secondary | ICD-10-CM | POA: Diagnosis not present

## 2021-04-13 DIAGNOSIS — M0579 Rheumatoid arthritis with rheumatoid factor of multiple sites without organ or systems involvement: Secondary | ICD-10-CM | POA: Diagnosis not present

## 2021-04-13 DIAGNOSIS — K219 Gastro-esophageal reflux disease without esophagitis: Secondary | ICD-10-CM | POA: Diagnosis not present

## 2021-04-13 DIAGNOSIS — E1169 Type 2 diabetes mellitus with other specified complication: Secondary | ICD-10-CM | POA: Diagnosis not present

## 2021-04-13 DIAGNOSIS — I10 Essential (primary) hypertension: Secondary | ICD-10-CM | POA: Diagnosis not present

## 2021-04-21 DIAGNOSIS — J4520 Mild intermittent asthma, uncomplicated: Secondary | ICD-10-CM | POA: Diagnosis not present

## 2021-04-21 DIAGNOSIS — E7800 Pure hypercholesterolemia, unspecified: Secondary | ICD-10-CM | POA: Diagnosis not present

## 2021-04-21 DIAGNOSIS — E1169 Type 2 diabetes mellitus with other specified complication: Secondary | ICD-10-CM | POA: Diagnosis not present

## 2021-04-21 DIAGNOSIS — M0579 Rheumatoid arthritis with rheumatoid factor of multiple sites without organ or systems involvement: Secondary | ICD-10-CM | POA: Diagnosis not present

## 2021-04-21 DIAGNOSIS — I10 Essential (primary) hypertension: Secondary | ICD-10-CM | POA: Diagnosis not present

## 2021-04-21 DIAGNOSIS — K219 Gastro-esophageal reflux disease without esophagitis: Secondary | ICD-10-CM | POA: Diagnosis not present

## 2021-06-22 DIAGNOSIS — M79672 Pain in left foot: Secondary | ICD-10-CM | POA: Diagnosis not present

## 2021-06-22 DIAGNOSIS — L110 Acquired keratosis follicularis: Secondary | ICD-10-CM | POA: Diagnosis not present

## 2021-06-22 DIAGNOSIS — M79671 Pain in right foot: Secondary | ICD-10-CM | POA: Diagnosis not present

## 2021-06-22 DIAGNOSIS — M79675 Pain in left toe(s): Secondary | ICD-10-CM | POA: Diagnosis not present

## 2021-06-22 DIAGNOSIS — M79674 Pain in right toe(s): Secondary | ICD-10-CM | POA: Diagnosis not present

## 2021-06-22 DIAGNOSIS — I739 Peripheral vascular disease, unspecified: Secondary | ICD-10-CM | POA: Diagnosis not present

## 2021-07-06 DIAGNOSIS — K219 Gastro-esophageal reflux disease without esophagitis: Secondary | ICD-10-CM | POA: Diagnosis not present

## 2021-07-06 DIAGNOSIS — I10 Essential (primary) hypertension: Secondary | ICD-10-CM | POA: Diagnosis not present

## 2021-07-06 DIAGNOSIS — E1169 Type 2 diabetes mellitus with other specified complication: Secondary | ICD-10-CM | POA: Diagnosis not present

## 2021-07-06 DIAGNOSIS — E7800 Pure hypercholesterolemia, unspecified: Secondary | ICD-10-CM | POA: Diagnosis not present

## 2021-07-21 DIAGNOSIS — Z0000 Encounter for general adult medical examination without abnormal findings: Secondary | ICD-10-CM | POA: Diagnosis not present

## 2021-07-21 DIAGNOSIS — M0579 Rheumatoid arthritis with rheumatoid factor of multiple sites without organ or systems involvement: Secondary | ICD-10-CM | POA: Diagnosis not present

## 2021-07-21 DIAGNOSIS — G4733 Obstructive sleep apnea (adult) (pediatric): Secondary | ICD-10-CM | POA: Diagnosis not present

## 2021-07-21 DIAGNOSIS — E7800 Pure hypercholesterolemia, unspecified: Secondary | ICD-10-CM | POA: Diagnosis not present

## 2021-07-21 DIAGNOSIS — I700 Atherosclerosis of aorta: Secondary | ICD-10-CM | POA: Diagnosis not present

## 2021-07-21 DIAGNOSIS — E1169 Type 2 diabetes mellitus with other specified complication: Secondary | ICD-10-CM | POA: Diagnosis not present

## 2021-07-21 DIAGNOSIS — R197 Diarrhea, unspecified: Secondary | ICD-10-CM | POA: Diagnosis not present

## 2021-07-21 DIAGNOSIS — I10 Essential (primary) hypertension: Secondary | ICD-10-CM | POA: Diagnosis not present

## 2021-07-25 DIAGNOSIS — R197 Diarrhea, unspecified: Secondary | ICD-10-CM | POA: Diagnosis not present

## 2021-08-03 DIAGNOSIS — K219 Gastro-esophageal reflux disease without esophagitis: Secondary | ICD-10-CM | POA: Diagnosis not present

## 2021-08-03 DIAGNOSIS — E7800 Pure hypercholesterolemia, unspecified: Secondary | ICD-10-CM | POA: Diagnosis not present

## 2021-08-03 DIAGNOSIS — J4520 Mild intermittent asthma, uncomplicated: Secondary | ICD-10-CM | POA: Diagnosis not present

## 2021-08-03 DIAGNOSIS — J4521 Mild intermittent asthma with (acute) exacerbation: Secondary | ICD-10-CM | POA: Diagnosis not present

## 2021-08-03 DIAGNOSIS — M0579 Rheumatoid arthritis with rheumatoid factor of multiple sites without organ or systems involvement: Secondary | ICD-10-CM | POA: Diagnosis not present

## 2021-08-03 DIAGNOSIS — E1169 Type 2 diabetes mellitus with other specified complication: Secondary | ICD-10-CM | POA: Diagnosis not present

## 2021-08-03 DIAGNOSIS — I10 Essential (primary) hypertension: Secondary | ICD-10-CM | POA: Diagnosis not present

## 2021-08-15 DIAGNOSIS — E7800 Pure hypercholesterolemia, unspecified: Secondary | ICD-10-CM | POA: Diagnosis not present

## 2021-08-15 DIAGNOSIS — J4521 Mild intermittent asthma with (acute) exacerbation: Secondary | ICD-10-CM | POA: Diagnosis not present

## 2021-08-15 DIAGNOSIS — K219 Gastro-esophageal reflux disease without esophagitis: Secondary | ICD-10-CM | POA: Diagnosis not present

## 2021-08-15 DIAGNOSIS — I10 Essential (primary) hypertension: Secondary | ICD-10-CM | POA: Diagnosis not present

## 2021-08-15 DIAGNOSIS — E1169 Type 2 diabetes mellitus with other specified complication: Secondary | ICD-10-CM | POA: Diagnosis not present

## 2021-09-18 ENCOUNTER — Other Ambulatory Visit: Payer: Self-pay | Admitting: Internal Medicine

## 2021-09-18 DIAGNOSIS — Z1231 Encounter for screening mammogram for malignant neoplasm of breast: Secondary | ICD-10-CM

## 2021-09-18 DIAGNOSIS — E1169 Type 2 diabetes mellitus with other specified complication: Secondary | ICD-10-CM | POA: Diagnosis not present

## 2021-09-18 DIAGNOSIS — K219 Gastro-esophageal reflux disease without esophagitis: Secondary | ICD-10-CM | POA: Diagnosis not present

## 2021-09-18 DIAGNOSIS — I10 Essential (primary) hypertension: Secondary | ICD-10-CM | POA: Diagnosis not present

## 2021-09-18 DIAGNOSIS — E7800 Pure hypercholesterolemia, unspecified: Secondary | ICD-10-CM | POA: Diagnosis not present

## 2021-09-21 DIAGNOSIS — L110 Acquired keratosis follicularis: Secondary | ICD-10-CM | POA: Diagnosis not present

## 2021-09-21 DIAGNOSIS — M79675 Pain in left toe(s): Secondary | ICD-10-CM | POA: Diagnosis not present

## 2021-09-21 DIAGNOSIS — M79672 Pain in left foot: Secondary | ICD-10-CM | POA: Diagnosis not present

## 2021-09-21 DIAGNOSIS — I739 Peripheral vascular disease, unspecified: Secondary | ICD-10-CM | POA: Diagnosis not present

## 2021-09-21 DIAGNOSIS — M79674 Pain in right toe(s): Secondary | ICD-10-CM | POA: Diagnosis not present

## 2021-09-21 DIAGNOSIS — M79671 Pain in right foot: Secondary | ICD-10-CM | POA: Diagnosis not present

## 2021-10-06 ENCOUNTER — Ambulatory Visit: Payer: Medicare Other

## 2021-11-02 ENCOUNTER — Ambulatory Visit: Payer: Medicare Other

## 2021-11-07 DIAGNOSIS — E1169 Type 2 diabetes mellitus with other specified complication: Secondary | ICD-10-CM | POA: Diagnosis not present

## 2021-11-07 DIAGNOSIS — M1711 Unilateral primary osteoarthritis, right knee: Secondary | ICD-10-CM | POA: Diagnosis not present

## 2021-11-07 DIAGNOSIS — I10 Essential (primary) hypertension: Secondary | ICD-10-CM | POA: Diagnosis not present

## 2021-11-07 DIAGNOSIS — K219 Gastro-esophageal reflux disease without esophagitis: Secondary | ICD-10-CM | POA: Diagnosis not present

## 2021-11-07 DIAGNOSIS — E7800 Pure hypercholesterolemia, unspecified: Secondary | ICD-10-CM | POA: Diagnosis not present

## 2021-11-21 DIAGNOSIS — I10 Essential (primary) hypertension: Secondary | ICD-10-CM | POA: Diagnosis not present

## 2021-11-21 DIAGNOSIS — K219 Gastro-esophageal reflux disease without esophagitis: Secondary | ICD-10-CM | POA: Diagnosis not present

## 2021-11-21 DIAGNOSIS — E7800 Pure hypercholesterolemia, unspecified: Secondary | ICD-10-CM | POA: Diagnosis not present

## 2021-11-21 DIAGNOSIS — E1169 Type 2 diabetes mellitus with other specified complication: Secondary | ICD-10-CM | POA: Diagnosis not present

## 2021-11-22 ENCOUNTER — Ambulatory Visit
Admission: RE | Admit: 2021-11-22 | Discharge: 2021-11-22 | Disposition: A | Discharge status: Home / Self Care | Payer: Medicare Other | Source: Ambulatory Visit | Attending: Internal Medicine | Admitting: Internal Medicine

## 2021-11-22 DIAGNOSIS — Z1231 Encounter for screening mammogram for malignant neoplasm of breast: Secondary | ICD-10-CM

## 2021-12-11 DIAGNOSIS — R040 Epistaxis: Secondary | ICD-10-CM | POA: Diagnosis not present

## 2021-12-11 DIAGNOSIS — E119 Type 2 diabetes mellitus without complications: Secondary | ICD-10-CM | POA: Diagnosis not present

## 2021-12-11 DIAGNOSIS — R0609 Other forms of dyspnea: Secondary | ICD-10-CM | POA: Diagnosis not present

## 2021-12-11 DIAGNOSIS — E1169 Type 2 diabetes mellitus with other specified complication: Secondary | ICD-10-CM | POA: Diagnosis not present

## 2021-12-11 DIAGNOSIS — G4733 Obstructive sleep apnea (adult) (pediatric): Secondary | ICD-10-CM | POA: Diagnosis not present

## 2021-12-11 DIAGNOSIS — I10 Essential (primary) hypertension: Secondary | ICD-10-CM | POA: Diagnosis not present

## 2021-12-11 DIAGNOSIS — E559 Vitamin D deficiency, unspecified: Secondary | ICD-10-CM | POA: Diagnosis not present

## 2021-12-14 DIAGNOSIS — M79675 Pain in left toe(s): Secondary | ICD-10-CM | POA: Diagnosis not present

## 2021-12-14 DIAGNOSIS — M79671 Pain in right foot: Secondary | ICD-10-CM | POA: Diagnosis not present

## 2021-12-14 DIAGNOSIS — M79672 Pain in left foot: Secondary | ICD-10-CM | POA: Diagnosis not present

## 2021-12-14 DIAGNOSIS — I739 Peripheral vascular disease, unspecified: Secondary | ICD-10-CM | POA: Diagnosis not present

## 2021-12-14 DIAGNOSIS — L110 Acquired keratosis follicularis: Secondary | ICD-10-CM | POA: Diagnosis not present

## 2021-12-14 DIAGNOSIS — M79674 Pain in right toe(s): Secondary | ICD-10-CM | POA: Diagnosis not present

## 2021-12-20 DIAGNOSIS — Z13820 Encounter for screening for osteoporosis: Secondary | ICD-10-CM | POA: Diagnosis not present

## 2021-12-20 DIAGNOSIS — M1990 Unspecified osteoarthritis, unspecified site: Secondary | ICD-10-CM | POA: Diagnosis not present

## 2021-12-20 DIAGNOSIS — R700 Elevated erythrocyte sedimentation rate: Secondary | ICD-10-CM | POA: Diagnosis not present

## 2021-12-20 DIAGNOSIS — M7060 Trochanteric bursitis, unspecified hip: Secondary | ICD-10-CM | POA: Diagnosis not present

## 2021-12-20 DIAGNOSIS — M0579 Rheumatoid arthritis with rheumatoid factor of multiple sites without organ or systems involvement: Secondary | ICD-10-CM | POA: Diagnosis not present

## 2021-12-20 DIAGNOSIS — M25559 Pain in unspecified hip: Secondary | ICD-10-CM | POA: Diagnosis not present

## 2021-12-20 DIAGNOSIS — M549 Dorsalgia, unspecified: Secondary | ICD-10-CM | POA: Diagnosis not present

## 2021-12-20 DIAGNOSIS — Z79899 Other long term (current) drug therapy: Secondary | ICD-10-CM | POA: Diagnosis not present

## 2021-12-20 DIAGNOSIS — M79643 Pain in unspecified hand: Secondary | ICD-10-CM | POA: Diagnosis not present

## 2021-12-21 ENCOUNTER — Encounter: Payer: Self-pay | Admitting: Cardiology

## 2021-12-21 ENCOUNTER — Ambulatory Visit: Payer: Medicare Other | Admitting: Cardiology

## 2021-12-21 VITALS — BP 149/59 | HR 84 | Resp 16 | Ht 63.0 in | Wt 211.0 lb

## 2021-12-21 DIAGNOSIS — R079 Chest pain, unspecified: Secondary | ICD-10-CM | POA: Diagnosis not present

## 2021-12-21 DIAGNOSIS — R0602 Shortness of breath: Secondary | ICD-10-CM

## 2021-12-21 DIAGNOSIS — R0609 Other forms of dyspnea: Secondary | ICD-10-CM | POA: Diagnosis not present

## 2021-12-21 MED ORDER — METOPROLOL SUCCINATE ER 25 MG PO TB24: 25.0000 mg | ORAL_TABLET | Freq: Every day | ORAL | 3 refills | Status: DC

## 2021-12-21 MED ORDER — NITROGLYCERIN 0.4 MG SL SUBL: 0.4000 mg | SUBLINGUAL_TABLET | SUBLINGUAL | 3 refills | Status: DC | PRN

## 2021-12-21 NOTE — Progress Notes (Signed)
Patient referred by Lavone Orn, MD for dyspnea on exertion  Subjective:   Isabella Oliver, female    DOB: 01-26-1950, 71 y.o.   MRN: [id number]   Chief Complaint  Patient presents with   Shortness of Breath   New Patient (Initial Visit)     HPI  71 y.o. African American female with hypertension, hyperlipidemia, type 2 DM, b/l carpal tunnel syndrome, with exertional chest pain and dyspnea  I last saw the patient in 2020.  More recently, patient has developed exertional chest tightness and dyspnea with minimal activity such as walking from parking lot to our office.  She denies any significant leg edema, orthopnea, PND symptoms.  Blood pressure is elevated today, and also remains elevated at home.   Past Medical History:  Diagnosis Date   Allergy    Arthritis    Asthma    Diabetes mellitus without complication (HCC)    GERD (gastroesophageal reflux disease)    Heart murmur    Grade I/VI 12/2013 at [facility] office   History of carpal tunnel syndrome    Bilateral   History of iron deficiency anemia    Hyperlipidemia    Hypertension    Pneumonia      Past Surgical History:  Procedure Laterality Date   BILATERAL CARPAL TUNNEL RELEASE     COLONOSCOPY     TOTAL KNEE ARTHROPLASTY Left 02/20/2016   Procedure: TOTAL KNEE ARTHROPLASTY;  Surgeon: Dorna Leitz, MD;  Location: [place];  Service: Orthopedics;  Laterality: Left;   TOTAL KNEE ARTHROPLASTY Right 08/29/2018   Procedure: TOTAL KNEE ARTHROPLASTY;  Surgeon: Dorna Leitz, MD;  Location: [facility];  Service: Orthopedics;  Laterality: Right;   TUBAL LIGATION     TUBAL LIGATION       Social History   Tobacco Use  Smoking Status Former   Packs/day: 0.50   Years: 20.00   Total pack years: 10.00   Types: Cigarettes   Quit date: 2001   Years since quitting: 22.9  Smokeless Tobacco Never    Social History   Substance and Sexual Activity  Alcohol Use Not Currently   Comment: seldom     Family History   Problem Relation Age of Onset   Lung disease Mother    Hypertension Father    Cancer Sister    Diabetes Sister    Cancer Maternal Grandmother    Cancer Maternal Grandfather    Breast cancer Neg Hx       Current Outpatient Medications:    acetaminophen (TYLENOL) 500 MG tablet, Take 1,000 mg by mouth every 6 (six) hours as needed for moderate pain., Disp: , Rfl:    albuterol (PROVENTIL HFA;VENTOLIN HFA) 108 (90 Base) MCG/ACT inhaler, Inhale 1-2 puffs into the lungs every 6 (six) hours as needed for wheezing or shortness of breath., Disp: , Rfl:    amLODipine (NORVASC) 10 MG tablet, Take 10 mg by mouth daily., Disp: , Rfl:    betamethasone dipropionate 0.05 % lotion, Apply 1 application topically daily as needed (scalp irritation)., Disp: , Rfl:    chlorthalidone (HYGROTON) 50 MG tablet, Take 2 tablets (100 mg total) by mouth daily., Disp: 60 tablet, Rfl: 2   Cholecalciferol (VITAMIN D3) 50 MCG (2000 UT) TABS, Take 2,000 Units by mouth daily. , Disp: , Rfl:    Ciclopirox 1 % shampoo, Apply 1 application topically 2 (two) times a week., Disp: , Rfl:    Dulaglutide (TRULICITY) 1.5 JJ/0.0XF SOPN, Inject into the skin., Disp: , Rfl:  empagliflozin (JARDIANCE) 25 MG TABS tablet, Take 25 mg by mouth daily., Disp: , Rfl:    Evolocumab (REPATHA SURECLICK) 735 MG/ML SOAJ, Inject into the skin., Disp: , Rfl:    folic acid (FOLVITE) 1 MG tablet, Take 1 mg by mouth daily., Disp: , Rfl:    methotrexate (RHEUMATREX) 2.5 MG tablet, Take 15 mg by mouth once a week., Disp: , Rfl:    mupirocin ointment (BACTROBAN) 2 %, Apply 1 Application topically daily., Disp: , Rfl:    potassium chloride (MICRO-K) 10 MEQ CR capsule, Take 10 mEq by mouth daily., Disp: , Rfl:    predniSONE (DELTASONE) 5 MG tablet, Take 5 mg by mouth daily with breakfast., Disp: , Rfl:    telmisartan (MICARDIS) 80 MG tablet, Take 80 mg by mouth daily., Disp: , Rfl:    Cardiovascular and other pertinent studies:  Reviewed external  labs and tests, independently interpreted  EKG 12/21/2021: Sinus rhythm 78 bpm Incomplete RBBB LAFB  Echocardiogram 08/15/2018: Left ventricle cavity is normal in size. Mild concentric hypertrophy of the left ventricle. Normal LV systolic function with EF 55%. Normal global wall motion. Doppler evidence of grade I (impaired) diastolic dysfunction, normal LAP. No significant valvular abnormalities.   Recent labs: 07/21/2021: Glucose 143, BUN/Cr 19/0.9. EGFR 63. K 4.0 HbA1C 6.6% Chol 186, TG 137, HDL 60, LDL 103 TSH 2.6 normal    Review of Systems  Cardiovascular:  Positive for chest pain and dyspnea on exertion. Negative for leg swelling, palpitations and syncope.         Vitals:   12/21/21 1311  BP: (!) 149/59  Pulse: 84  Resp: 16  SpO2: 97%     Body mass index is 37.38 kg/m. Filed Weights   12/21/21 1311  Weight: 211 lb (95.7 kg)     Objective:   Physical Exam Vitals and nursing note reviewed.  Constitutional:      General: She is not in acute distress. Neck:     Vascular: No JVD.  Cardiovascular:     Rate and Rhythm: Normal rate and regular rhythm.     Heart sounds: Murmur heard.     High-pitched blowing holosystolic murmur is present with a grade of 2/6 at the apex.  Pulmonary:     Effort: Pulmonary effort is normal.     Breath sounds: Normal breath sounds. No wheezing or rales.  Musculoskeletal:     Right lower leg: No tenderness.     Left lower leg: No tenderness.          Visit diagnoses:   ICD-10-CM   1. Shortness of breath  R06.02 EKG 12-Lead    2. Exertional dyspnea  R06.09 PCV ECHOCARDIOGRAM COMPLETE    PCV MYOCARDIAL PERFUSION WITH LEXISCAN    metoprolol succinate (TOPROL-XL) 25 MG 24 hr tablet    3. Exertional chest pain  R07.9 PCV ECHOCARDIOGRAM COMPLETE    PCV MYOCARDIAL PERFUSION WITH LEXISCAN    metoprolol succinate (TOPROL-XL) 25 MG 24 hr tablet       Orders Placed This Encounter  Procedures   EKG 12-Lead       Assessment & Recommendations:    71 y.o. African American female with hypertension, hyperlipidemia, type 2 DM, b/l carpal tunnel syndrome, with exertional chest pain and dyspnea  Dyspnea on exertion, exertional chest pain: Symptoms concerning for possible CAD and/or heart failure. Uncontrolled hypertension could also be contributing. Recommend echocardiogram, Lexiscan nuclear stress test. Ideally, would like to add aspirin 81 mg daily, but may need to  check with PCP regarding ongoing use of methotrexate. Continue statin. Added metoprolol succinate 25 mg daily, and as needed sublingual nitroglycerin. Discussed reasons to call 911, if exertional chest pain does not reduce/resolve with chest and 2 sublingual nitroglycerin..   Thank you for referring the patient to [place]. Please feel free to contact with any questions.   Nigel Mormon, MD Pager: [phone] Office: [phone]

## 2021-12-27 ENCOUNTER — Ambulatory Visit: Payer: Medicare Other

## 2021-12-27 DIAGNOSIS — R0609 Other forms of dyspnea: Secondary | ICD-10-CM | POA: Diagnosis not present

## 2021-12-27 DIAGNOSIS — R079 Chest pain, unspecified: Secondary | ICD-10-CM | POA: Diagnosis not present

## 2022-01-15 ENCOUNTER — Ambulatory Visit: Payer: Medicare Other

## 2022-01-15 DIAGNOSIS — R0609 Other forms of dyspnea: Secondary | ICD-10-CM

## 2022-01-15 DIAGNOSIS — R079 Chest pain, unspecified: Secondary | ICD-10-CM

## 2022-01-16 LAB — PCV MYOCARDIAL PERFUSION WITH LEXISCAN: ST Depression (mm): 0 mm

## 2022-01-25 ENCOUNTER — Ambulatory Visit: Payer: Medicare Other | Admitting: Cardiology

## 2022-01-25 ENCOUNTER — Encounter: Payer: Self-pay | Admitting: Cardiology

## 2022-01-25 VITALS — BP 134/63 | HR 77 | Resp 16 | Ht 63.0 in | Wt 214.0 lb

## 2022-01-25 DIAGNOSIS — R0609 Other forms of dyspnea: Secondary | ICD-10-CM

## 2022-01-25 DIAGNOSIS — I10 Essential (primary) hypertension: Secondary | ICD-10-CM

## 2022-01-25 NOTE — Progress Notes (Signed)
Patient referred by Lavone Orn, MD for dyspnea on exertion  Subjective:   Isabella Oliver, female    DOB: May 26, 1950, 72 y.o.   MRN: [id number]   Chief Complaint  Patient presents with   Shortness of Breath   Follow-up    4 week     HPI  72 y.o. African American female with hypertension, hyperlipidemia, type 2 DM, b/l carpal tunnel syndrome, with exertional chest pain and dyspnea  Reviewed recent test results with the patient, details below. Dyspnea persists, but better. She reportedly has h/o asthma. She has not had any PFT recently. Chest pain is resolved.     Current Outpatient Medications:    acetaminophen (TYLENOL) 500 MG tablet, Take 1,000 mg by mouth every 6 (six) hours as needed for moderate pain., Disp: , Rfl:    albuterol (PROVENTIL HFA;VENTOLIN HFA) 108 (90 Base) MCG/ACT inhaler, Inhale 1-2 puffs into the lungs every 6 (six) hours as needed for wheezing or shortness of breath., Disp: , Rfl:    amLODipine (NORVASC) 10 MG tablet, Take 10 mg by mouth daily., Disp: , Rfl:    betamethasone dipropionate 0.05 % lotion, Apply 1 application topically daily as needed (scalp irritation)., Disp: , Rfl:    chlorthalidone (HYGROTON) 50 MG tablet, Take 2 tablets (100 mg total) by mouth daily., Disp: 60 tablet, Rfl: 2   Cholecalciferol (VITAMIN D3) 50 MCG (2000 UT) TABS, Take 2,000 Units by mouth daily. , Disp: , Rfl:    Ciclopirox 1 % shampoo, Apply 1 application topically 2 (two) times a week., Disp: , Rfl:    Dulaglutide (TRULICITY) 1.5 AL/9.3XT SOPN, Inject into the skin., Disp: , Rfl:    empagliflozin (JARDIANCE) 25 MG TABS tablet, Take 25 mg by mouth daily., Disp: , Rfl:    Evolocumab (REPATHA SURECLICK) 024 MG/ML SOAJ, Inject into the skin., Disp: , Rfl:    folic acid (FOLVITE) 1 MG tablet, Take 1 mg by mouth daily., Disp: , Rfl:    methotrexate (RHEUMATREX) 2.5 MG tablet, Take 15 mg by mouth once a week., Disp: , Rfl:    metoprolol succinate (TOPROL-XL) 25 MG 24 hr  tablet, Take 1 tablet (25 mg total) by mouth daily. Take with or immediately following a meal., Disp: 30 tablet, Rfl: 3   mupirocin ointment (BACTROBAN) 2 %, Apply 1 Application topically daily., Disp: , Rfl:    nitroGLYCERIN (NITROSTAT) 0.4 MG SL tablet, Place 1 tablet (0.4 mg total) under the tongue every 5 (five) minutes as needed for chest pain., Disp: 30 tablet, Rfl: 3   potassium chloride (MICRO-K) 10 MEQ CR capsule, Take 10 mEq by mouth daily., Disp: , Rfl:    predniSONE (DELTASONE) 5 MG tablet, Take 5 mg by mouth daily with breakfast., Disp: , Rfl:    telmisartan (MICARDIS) 80 MG tablet, Take 80 mg by mouth daily., Disp: , Rfl:    Cardiovascular and other pertinent studies:  Reviewed external labs and tests, independently interpreted  Regadenoson  Nuclear stress test 01/15/2022: There is soft tissue attenuation artifact in the inferior wall without ischemia or scar. Overall LV systolic function is normal without regional wall motion abnormalities. Stress LV EF: 71%.  Nondiagnostic ECG stress. The heart rate response was consistent with Regadenoson.  No previous exam available for comparison. Low risk.   Echocardiogram 12/27/2021:  Left ventricle cavity is normal in size and wall thickness. Normal global  wall motion. Normal LV systolic function with EF 74%. Doppler evidence of  grade I (impaired) diastolic  dysfunction, normal LAP.  No significant valvular abnormality.  Estimated pulmonary artery systolic pressure 32 mmHg.  No significant change compared to previous study in 2020.   EKG 12/21/2021: Sinus rhythm 78 bpm Incomplete RBBB LAFB  Recent labs: 07/21/2021: Glucose 143, BUN/Cr 19/0.9. EGFR 63. K 4.0 HbA1C 6.6% Chol 186, TG 137, HDL 60, LDL 103 TSH 2.6 normal    Review of Systems  Cardiovascular:  Positive for dyspnea on exertion. Negative for chest pain, leg swelling, palpitations and syncope.         Vitals:   01/25/22 1444  BP: 134/63  Pulse: 77  Resp:  16  SpO2: 96%     Body mass index is 37.91 kg/m. Filed Weights   01/25/22 1444  Weight: 214 lb (97.1 kg)     Objective:   Physical Exam Vitals and nursing note reviewed.  Constitutional:      General: She is not in acute distress. Neck:     Vascular: No JVD.  Cardiovascular:     Rate and Rhythm: Normal rate and regular rhythm.     Heart sounds: Murmur heard.     High-pitched blowing holosystolic murmur is present with a grade of 2/6 at the apex.  Pulmonary:     Effort: Pulmonary effort is normal.     Breath sounds: Normal breath sounds. No wheezing or rales.  Musculoskeletal:     Right lower leg: No tenderness.     Left lower leg: No tenderness.          Visit diagnoses:   ICD-10-CM   1. Exertional dyspnea  R06.09     2. Essential hypertension  I10          Assessment & Recommendations:    72 y.o. African American female with hypertension, hyperlipidemia, type 2 DM, b/l carpal tunnel syndrome, with exertional chest pain and dyspnea  Dyspnea on exertion, exertional chest pain: Chest pain resolved. Dyspnea persists, but better. No ischemia on stress testing.  Echocardiogram unremarkable. Consider workup for asthma. In future, if symptoms no better in spite of treatment for possible asthma, could consider LHC/RHC. Given her epistaxis, stopped Aspirin.  Thank you for referring the patient to [place]. Please feel free to contact with any questions.   Nigel Mormon, MD Pager: [phone] Office: [phone]

## 2022-03-06 ENCOUNTER — Other Ambulatory Visit (HOSPITAL_BASED_OUTPATIENT_CLINIC_OR_DEPARTMENT_OTHER): Payer: Self-pay

## 2022-03-06 DIAGNOSIS — R0609 Other forms of dyspnea: Secondary | ICD-10-CM

## 2022-03-08 ENCOUNTER — Ambulatory Visit (INDEPENDENT_AMBULATORY_CARE_PROVIDER_SITE_OTHER): Payer: Medicare Other | Admitting: Internal Medicine

## 2022-03-08 DIAGNOSIS — R0609 Other forms of dyspnea: Secondary | ICD-10-CM | POA: Diagnosis not present

## 2022-03-08 LAB — PULMONARY FUNCTION TEST
DL/VA % pred: 120 %
DL/VA: 5 ml/min/mmHg/L
DLCO cor % pred: 73 %
DLCO cor: 13.68 ml/min/mmHg
DLCO unc % pred: 73 %
DLCO unc: 13.68 ml/min/mmHg
FEF 25-75 Pre: 0.72 L/sec
FEF2575-%Pred-Pre: 42 %
FEV1-%Pred-Pre: 45 %
FEV1-Pre: 0.94 L
FEV1FVC-%Pred-Pre: 103 %
FEV6-%Pred-Pre: 45 %
FEV6-Pre: 1.2 L
FEV6FVC-%Pred-Pre: 105 %
FVC-%Pred-Pre: 43 %
FVC-Pre: 1.2 L
Pre FEV1/FVC ratio: 78 %
Pre FEV6/FVC Ratio: 100 %

## 2022-03-08 NOTE — Patient Instructions (Signed)
Spiro/ DLCO performed today. Patient unable to do DLCO to meet ATS standards.

## 2022-03-16 DIAGNOSIS — I700 Atherosclerosis of aorta: Secondary | ICD-10-CM | POA: Diagnosis not present

## 2022-03-16 DIAGNOSIS — D84821 Immunodeficiency due to drugs: Secondary | ICD-10-CM | POA: Diagnosis not present

## 2022-03-16 DIAGNOSIS — E119 Type 2 diabetes mellitus without complications: Secondary | ICD-10-CM | POA: Diagnosis not present

## 2022-03-16 DIAGNOSIS — J984 Other disorders of lung: Secondary | ICD-10-CM | POA: Diagnosis not present

## 2022-03-19 ENCOUNTER — Other Ambulatory Visit: Payer: Self-pay | Admitting: Internal Medicine

## 2022-03-19 DIAGNOSIS — J984 Other disorders of lung: Secondary | ICD-10-CM

## 2022-03-20 DIAGNOSIS — K219 Gastro-esophageal reflux disease without esophagitis: Secondary | ICD-10-CM | POA: Diagnosis not present

## 2022-03-20 DIAGNOSIS — E7800 Pure hypercholesterolemia, unspecified: Secondary | ICD-10-CM | POA: Diagnosis not present

## 2022-03-20 DIAGNOSIS — I10 Essential (primary) hypertension: Secondary | ICD-10-CM | POA: Diagnosis not present

## 2022-03-20 DIAGNOSIS — E1169 Type 2 diabetes mellitus with other specified complication: Secondary | ICD-10-CM | POA: Diagnosis not present

## 2022-04-09 DIAGNOSIS — M79671 Pain in right foot: Secondary | ICD-10-CM | POA: Diagnosis not present

## 2022-04-09 DIAGNOSIS — M79672 Pain in left foot: Secondary | ICD-10-CM | POA: Diagnosis not present

## 2022-04-09 DIAGNOSIS — M79674 Pain in right toe(s): Secondary | ICD-10-CM | POA: Diagnosis not present

## 2022-04-09 DIAGNOSIS — M79675 Pain in left toe(s): Secondary | ICD-10-CM | POA: Diagnosis not present

## 2022-04-09 DIAGNOSIS — I739 Peripheral vascular disease, unspecified: Secondary | ICD-10-CM | POA: Diagnosis not present

## 2022-04-09 DIAGNOSIS — L110 Acquired keratosis follicularis: Secondary | ICD-10-CM | POA: Diagnosis not present

## 2022-04-10 ENCOUNTER — Encounter: Payer: Self-pay | Admitting: Pulmonary Disease

## 2022-04-10 ENCOUNTER — Ambulatory Visit: Payer: Medicare Other | Admitting: Pulmonary Disease

## 2022-04-10 VITALS — BP 120/64 | HR 67 | Temp 98.1°F | Ht 63.0 in | Wt 207.8 lb

## 2022-04-10 DIAGNOSIS — M0579 Rheumatoid arthritis with rheumatoid factor of multiple sites without organ or systems involvement: Secondary | ICD-10-CM

## 2022-04-10 DIAGNOSIS — J984 Other disorders of lung: Secondary | ICD-10-CM

## 2022-04-10 DIAGNOSIS — Z79631 Long term (current) use of antimetabolite agent: Secondary | ICD-10-CM | POA: Diagnosis not present

## 2022-04-10 MED ORDER — FLUTICASONE-SALMETEROL 115-21 MCG/ACT IN AERO: 2.0000 | INHALATION_SPRAY | Freq: Two times a day (BID) | RESPIRATORY_TRACT | 12 refills | Status: DC

## 2022-04-10 NOTE — Patient Instructions (Addendum)
You are scheduled for high resolution CT Chest scan on 4/6, we will follow these results up  We will request records from Dr. Kathlene November at [facility] for review.  Start advair inhaler 2 puffs twice daily - rinse mouth out after each use  We will have you follow up in 1 month with complete pulmonary function tests

## 2022-04-10 NOTE — Progress Notes (Signed)
Synopsis: Referred in April 2024 for Restrictive Lung Disease by Rochele Raring, MD  Subjective:   PATIENT ID: Isabella Oliver: female DOB: 03-16-1950, MRN: [id number]  HPI  Chief Complaint  Patient presents with   Consult    Pft 2/29, sob    Isabella Oliver is a 72 year old woman, former smoker with DMII, hypertension, hyperlipidemia and rheumatoid arthritis who is referred to pulmonary clinic for restrictive lung disease.   Spirometry on 03/08/22 was suggestive of restrictive pattern and mild diffusion defect.   She was seen by cardiology 01/25/22, note reviewed. She had low risk Nuclear stress test. Echo 12/28/22 showed LV EF 74% and grade I diastolic dysfunction.   She has progressive dyspnea with exertion. She has stopped bowling due to shortness of breath. She used to go for walks and has stopped doing this. She has a cough that is dry. The cough has woken her up from sleep in the past but not recently. No sinus congestion currently. She does have wheezing with the shortness of breath. She reports history of asthma, diagnosed as an adult.   She quit smoking in 2011. She smoked half a pack per day for 30 to 40 years. She reports second hand smoke exposure from her parents in childhood. She worked in [employer] in the past, [employer] and 3 other companies. No seasonal allergies. She has GERD which is under control at this time. Her mother had lung disease, she was a smoker. No pets at home.   Her mother had RA and maternal grandmother had RA. She was diganosed with RA 6-7 years ago and sees Dr. Kathlene November at [facility]. She takes methotrexate 15mg  weekly for a year and a half which has helped her joint pains significantly.   Past Medical History:  Diagnosis Date   Allergy    Arthritis    Asthma    Diabetes mellitus without complication    GERD (gastroesophageal reflux disease)    Heart murmur    Grade I/VI 12/2013 at [facility] office   History of carpal tunnel  syndrome    Bilateral   History of iron deficiency anemia    Hyperlipidemia    Hypertension    Pneumonia      Family History  Problem Relation Age of Onset   Lung disease Mother    Hypertension Father    Cancer Sister    Diabetes Sister    Cancer Maternal Grandmother    Cancer Maternal Grandfather    Breast cancer Neg Hx      Social History   Socioeconomic History   Marital status: Divorced    Spouse name: Not on file   Number of children: 2   Years of education: Not on file   Highest education level: Not on file  Occupational History   Not on file  Tobacco Use   Smoking status: Former    Packs/day: 0.50    Years: 20.00    Additional pack years: 0.00    Total pack years: 10.00    Types: Cigarettes    Quit date: 2001    Years since quitting: 23.2   Smokeless tobacco: Never  Vaping Use   Vaping Use: Never used  Substance and Sexual Activity   Alcohol use: Not Currently    Comment: seldom   Drug use: No   Sexual activity: Not on file  Other Topics Concern   Not on file  Social History Narrative   Not on file  Social Determinants of Health   Financial Resource Strain: Not on file  Food Insecurity: Not on file  Transportation Needs: Not on file  Physical Activity: Not on file  Stress: Not on file  Social Connections: Not on file  Intimate Partner Violence: Not on file     Allergies  Allergen Reactions   Food Swelling and Other (See Comments)    CUCUMBERS TONGUE SWELLING   Codeine Nausea Only   Methocarbamol Nausea Only   Tramadol Nausea Only    Patient states she tolerates   Voltaren [Diclofenac Sodium] Rash     Outpatient Medications Prior to Visit  Medication Sig Dispense Refill   acetaminophen (TYLENOL) 500 MG tablet Take 1,000 mg by mouth every 6 (six) hours as needed for moderate pain.     albuterol (PROVENTIL HFA;VENTOLIN HFA) 108 (90 Base) MCG/ACT inhaler Inhale 1-2 puffs into the lungs every 6 (six) hours as needed for wheezing or  shortness of breath.     amLODipine (NORVASC) 10 MG tablet Take 10 mg by mouth daily.     betamethasone dipropionate 0.05 % lotion Apply 1 application topically daily as needed (scalp irritation).     chlorthalidone (HYGROTON) 50 MG tablet Take 2 tablets (100 mg total) by mouth daily. 60 tablet 2   Cholecalciferol (VITAMIN D3) 50 MCG (2000 UT) TABS Take 2,000 Units by mouth daily.      Dulaglutide (TRULICITY) 1.5 0000000 SOPN Inject into the skin.     empagliflozin (JARDIANCE) 25 MG TABS tablet Take 25 mg by mouth daily.     Evolocumab (REPATHA SURECLICK) XX123456 MG/ML SOAJ Inject into the skin.     folic acid (FOLVITE) 1 MG tablet Take 1 mg by mouth daily.     methotrexate (RHEUMATREX) 2.5 MG tablet Take 15 mg by mouth once a week.     mupirocin ointment (BACTROBAN) 2 % Apply 1 Application topically daily.     potassium chloride (MICRO-K) 10 MEQ CR capsule Take 10 mEq by mouth daily.     telmisartan (MICARDIS) 80 MG tablet Take 80 mg by mouth daily.     metoprolol succinate (TOPROL-XL) 25 MG 24 hr tablet Take 1 tablet (25 mg total) by mouth daily. Take with or immediately following a meal. (Patient not taking: Reported on 01/25/2022) 30 tablet 3   nitroGLYCERIN (NITROSTAT) 0.4 MG SL tablet Place 1 tablet (0.4 mg total) under the tongue every 5 (five) minutes as needed for chest pain. 30 tablet 3   No facility-administered medications prior to visit.   Review of Systems  Constitutional:  Negative for chills, fever, malaise/fatigue and weight loss.  HENT:  Positive for sore throat. Negative for congestion and sinus pain.   Eyes: Negative.   Respiratory:  Positive for shortness of breath. Negative for cough, hemoptysis, sputum production and wheezing.   Cardiovascular:  Negative for chest pain, palpitations, orthopnea, claudication and leg swelling.  Gastrointestinal:  Positive for heartburn. Negative for abdominal pain, nausea and vomiting.  Genitourinary: Negative.   Musculoskeletal:  Positive  for joint pain. Negative for myalgias.  Skin:  Negative for rash.  Neurological:  Positive for headaches. Negative for weakness.  Endo/Heme/Allergies: Negative.   Psychiatric/Behavioral: Negative.     Objective:   Vitals:   04/10/22 1302  BP: 120/64  Pulse: 67  Temp: 98.1 F (36.7 C)  TempSrc: Oral  SpO2: 98%  Weight: 207 lb 12.8 oz (94.3 kg)  Height: 5\' 3"  (1.6 m)    Physical Exam Constitutional:  General: She is not in acute distress.    Appearance: She is obese. She is not ill-appearing.  HENT:     Head: Normocephalic and atraumatic.  Eyes:     General: No scleral icterus.    Conjunctiva/sclera: Conjunctivae normal.     Pupils: Pupils are equal, round, and reactive to light.  Cardiovascular:     Rate and Rhythm: Normal rate and regular rhythm.     Pulses: Normal pulses.     Heart sounds: Normal heart sounds. No murmur heard. Pulmonary:     Effort: Pulmonary effort is normal.     Breath sounds: Normal breath sounds. No wheezing, rhonchi or rales.  Abdominal:     General: Bowel sounds are normal.     Palpations: Abdomen is soft.  Musculoskeletal:     Right lower leg: No edema.     Left lower leg: No edema.  Lymphadenopathy:     Cervical: No cervical adenopathy.  Skin:    General: Skin is warm and dry.  Neurological:     General: No focal deficit present.     Mental Status: She is alert.  Psychiatric:        Mood and Affect: Mood normal.        Behavior: Behavior normal.        Thought Content: Thought content normal.        Judgment: Judgment normal.    CBC    Component Value Date/Time   WBC 7.0 10/23/2019 1451   RBC 3.82 (L) 10/23/2019 1451   HGB 12.1 10/23/2019 1451   HCT 36.7 10/23/2019 1451   PLT 308 10/23/2019 1451   MCV 96.1 10/23/2019 1451   MCV 92.8 10/30/2013 1614   MCH 31.7 10/23/2019 1451   MCHC 33.0 10/23/2019 1451   RDW 14.8 10/23/2019 1451   LYMPHSABS 2.0 08/22/2018 1022   MONOABS 0.6 08/22/2018 1022   EOSABS 0.4 08/22/2018  1022   BASOSABS 0.1 08/22/2018 1022      Latest Ref Rng & Units 10/23/2019    2:51 PM 08/31/2018    3:26 AM 08/30/2018   12:27 PM  BMP  Glucose 70 - 99 mg/dL 161  161  140   BUN 8 - 23 mg/dL 13  21  9    Creatinine 0.44 - 1.00 mg/dL 1.19  1.07  0.79   Sodium 135 - 145 mmol/L 138  133  135   Potassium 3.5 - 5.1 mmol/L 3.2  3.6  3.3   Chloride 98 - 111 mmol/L 97  93  98   CO2 22 - 32 mmol/L 27  25  22    Calcium 8.9 - 10.3 mg/dL 9.4  9.5  9.1    Chest imaging:  PFT:    Latest Ref Rng & Units 03/08/2022    1:49 PM  PFT Results  FVC-Pre L 1.20   FVC-Predicted Pre % 43   Pre FEV1/FVC % % 78   FEV1-Pre L 0.94   FEV1-Predicted Pre % 45   DLCO uncorrected ml/min/mmHg 13.68   DLCO UNC% % 73   DLCO corrected ml/min/mmHg 13.68   DLCO COR %Predicted % 73   DLVA Predicted % 120     Labs:  Path:  Echo:  Heart Catheterization:    Assessment & Plan:   Restrictive lung disease - Plan: Pulmonary Function Test, fluticasone-salmeterol (ADVAIR HFA) 115-21 MCG/ACT inhaler  Rheumatoid arthritis involving multiple sites with positive rheumatoid factor  Methotrexate, long term, current use  Discussion: Isabella Oliver is a 72  year old woman, former smoker with DMII, hypertension, hyperlipidemia and rheumatoid arthritis who is referred to pulmonary clinic for restrictive lung disease.   Her PFTs are suggestive of restrictive lung disease. We will schedule her for complete PFTs at follow up visit for further evaluation.  She is scheduled for HRCT Chest on 4/8, we will follow up on these results.   Differential for her dyspnea includes interstitial lung disease related to rheumatoid arthritis vs methotrexate lung toxicity vs uncontrolled asthma vs obesity/deconditioning.  She is to start advair 115-55mcg 2 puffs twice daily and continue as needed albuterol.   Follow up in 1 month with PFTs.   Freda Jackson, MD [facility] Office: [phone]   Current  Outpatient Medications:    acetaminophen (TYLENOL) 500 MG tablet, Take 1,000 mg by mouth every 6 (six) hours as needed for moderate pain., Disp: , Rfl:    albuterol (PROVENTIL HFA;VENTOLIN HFA) 108 (90 Base) MCG/ACT inhaler, Inhale 1-2 puffs into the lungs every 6 (six) hours as needed for wheezing or shortness of breath., Disp: , Rfl:    amLODipine (NORVASC) 10 MG tablet, Take 10 mg by mouth daily., Disp: , Rfl:    betamethasone dipropionate 0.05 % lotion, Apply 1 application topically daily as needed (scalp irritation)., Disp: , Rfl:    chlorthalidone (HYGROTON) 50 MG tablet, Take 2 tablets (100 mg total) by mouth daily., Disp: 60 tablet, Rfl: 2   Cholecalciferol (VITAMIN D3) 50 MCG (2000 UT) TABS, Take 2,000 Units by mouth daily. , Disp: , Rfl:    Dulaglutide (TRULICITY) 1.5 0000000 SOPN, Inject into the skin., Disp: , Rfl:    empagliflozin (JARDIANCE) 25 MG TABS tablet, Take 25 mg by mouth daily., Disp: , Rfl:    Evolocumab (REPATHA SURECLICK) XX123456 MG/ML SOAJ, Inject into the skin., Disp: , Rfl:    fluticasone-salmeterol (ADVAIR HFA) 115-21 MCG/ACT inhaler, Inhale 2 puffs into the lungs 2 (two) times daily., Disp: 1 each, Rfl: 12   folic acid (FOLVITE) 1 MG tablet, Take 1 mg by mouth daily., Disp: , Rfl:    methotrexate (RHEUMATREX) 2.5 MG tablet, Take 15 mg by mouth once a week., Disp: , Rfl:    mupirocin ointment (BACTROBAN) 2 %, Apply 1 Application topically daily., Disp: , Rfl:    potassium chloride (MICRO-K) 10 MEQ CR capsule, Take 10 mEq by mouth daily., Disp: , Rfl:    telmisartan (MICARDIS) 80 MG tablet, Take 80 mg by mouth daily., Disp: , Rfl:    metoprolol succinate (TOPROL-XL) 25 MG 24 hr tablet, Take 1 tablet (25 mg total) by mouth daily. Take with or immediately following a meal. (Patient not taking: Reported on 01/25/2022), Disp: 30 tablet, Rfl: 3   nitroGLYCERIN (NITROSTAT) 0.4 MG SL tablet, Place 1 tablet (0.4 mg total) under the tongue every 5 (five) minutes as needed for chest  pain., Disp: 30 tablet, Rfl: 3

## 2022-04-16 ENCOUNTER — Ambulatory Visit
Admission: RE | Admit: 2022-04-16 | Discharge: 2022-04-16 | Disposition: A | Discharge status: Home / Self Care | Payer: Medicare Other | Source: Ambulatory Visit | Attending: Internal Medicine | Admitting: Internal Medicine

## 2022-04-16 DIAGNOSIS — K449 Diaphragmatic hernia without obstruction or gangrene: Secondary | ICD-10-CM | POA: Diagnosis not present

## 2022-04-16 DIAGNOSIS — I3139 Other pericardial effusion (noninflammatory): Secondary | ICD-10-CM | POA: Diagnosis not present

## 2022-04-16 DIAGNOSIS — R918 Other nonspecific abnormal finding of lung field: Secondary | ICD-10-CM | POA: Diagnosis not present

## 2022-04-16 DIAGNOSIS — I700 Atherosclerosis of aorta: Secondary | ICD-10-CM | POA: Diagnosis not present

## 2022-04-16 DIAGNOSIS — J984 Other disorders of lung: Secondary | ICD-10-CM

## 2022-04-30 DIAGNOSIS — M0579 Rheumatoid arthritis with rheumatoid factor of multiple sites without organ or systems involvement: Secondary | ICD-10-CM | POA: Diagnosis not present

## 2022-04-30 DIAGNOSIS — R700 Elevated erythrocyte sedimentation rate: Secondary | ICD-10-CM | POA: Diagnosis not present

## 2022-04-30 DIAGNOSIS — M25559 Pain in unspecified hip: Secondary | ICD-10-CM | POA: Diagnosis not present

## 2022-04-30 DIAGNOSIS — Z13820 Encounter for screening for osteoporosis: Secondary | ICD-10-CM | POA: Diagnosis not present

## 2022-04-30 DIAGNOSIS — M7060 Trochanteric bursitis, unspecified hip: Secondary | ICD-10-CM | POA: Diagnosis not present

## 2022-04-30 DIAGNOSIS — M79643 Pain in unspecified hand: Secondary | ICD-10-CM | POA: Diagnosis not present

## 2022-04-30 DIAGNOSIS — Z79899 Other long term (current) drug therapy: Secondary | ICD-10-CM | POA: Diagnosis not present

## 2022-04-30 DIAGNOSIS — M1990 Unspecified osteoarthritis, unspecified site: Secondary | ICD-10-CM | POA: Diagnosis not present

## 2022-04-30 DIAGNOSIS — M549 Dorsalgia, unspecified: Secondary | ICD-10-CM | POA: Diagnosis not present

## 2022-05-14 ENCOUNTER — Ambulatory Visit (INDEPENDENT_AMBULATORY_CARE_PROVIDER_SITE_OTHER): Payer: Medicare Other | Admitting: Pulmonary Disease

## 2022-05-14 DIAGNOSIS — J984 Other disorders of lung: Secondary | ICD-10-CM | POA: Diagnosis not present

## 2022-05-14 LAB — PULMONARY FUNCTION TEST
DL/VA % pred: 118 %
DL/VA: 4.94 ml/min/mmHg/L
DLCO cor % pred: 66 %
DLCO cor: 11.98 ml/min/mmHg
DLCO unc % pred: 66 %
DLCO unc: 11.98 ml/min/mmHg
FEF 25-75 Post: 1.19 L/sec
FEF 25-75 Pre: 1.36 L/sec
FEF2575-%Change-Post: -12 %
FEF2575-%Pred-Post: 72 %
FEF2575-%Pred-Pre: 82 %
FEV1-%Change-Post: -1 %
FEV1-%Pred-Post: 54 %
FEV1-%Pred-Pre: 55 %
FEV1-Post: 1.08 L
FEV1-Pre: 1.1 L
FEV1FVC-%Change-Post: -4 %
FEV1FVC-%Pred-Pre: 114 %
FEV6-%Change-Post: 2 %
FEV6-%Pred-Post: 52 %
FEV6-%Pred-Pre: 50 %
FEV6-Post: 1.31 L
FEV6-Pre: 1.27 L
FEV6FVC-%Pred-Post: 105 %
FEV6FVC-%Pred-Pre: 105 %
FVC-%Change-Post: 2 %
FVC-%Pred-Post: 49 %
FVC-%Pred-Pre: 48 %
FVC-Post: 1.31 L
FVC-Pre: 1.27 L
Post FEV1/FVC ratio: 82 %
Post FEV6/FVC ratio: 100 %
Pre FEV1/FVC ratio: 86 %
Pre FEV6/FVC Ratio: 100 %
RV % pred: 127 %
RV: 2.75 L
TLC % pred: 92 %
TLC: 4.39 L

## 2022-05-14 NOTE — Progress Notes (Signed)
Full PFT Performed Today  

## 2022-05-14 NOTE — Patient Instructions (Signed)
Full PFT Performed Today  

## 2022-06-11 ENCOUNTER — Ambulatory Visit: Payer: Medicare Other | Admitting: Pulmonary Disease

## 2022-06-11 ENCOUNTER — Encounter: Payer: Self-pay | Admitting: Pulmonary Disease

## 2022-06-11 VITALS — BP 128/62 | HR 85 | Ht 62.0 in | Wt 210.0 lb

## 2022-06-11 DIAGNOSIS — J439 Emphysema, unspecified: Secondary | ICD-10-CM

## 2022-06-11 DIAGNOSIS — J984 Other disorders of lung: Secondary | ICD-10-CM | POA: Diagnosis not present

## 2022-06-11 DIAGNOSIS — E669 Obesity, unspecified: Secondary | ICD-10-CM

## 2022-06-11 DIAGNOSIS — R0683 Snoring: Secondary | ICD-10-CM | POA: Diagnosis not present

## 2022-06-11 NOTE — Progress Notes (Signed)
Synopsis: Referred in April 2024 for Restrictive Lung Disease by Hillard Danker, MD  Subjective:   PATIENT ID: Isabella Oliver: female DOB: 11/29/1950, MRN: [id number]  HPI  Chief Complaint  Patient presents with   Follow-up    F/U after PFT and CT. States she has been stable since last visit. Still having occasional nose bleeds.    Isabella Oliver is a 72 year old woman, former smoker with DMII, hypertension, hyperlipidemia and rheumatoid arthritis who returns to pulmonary clinic for restrictive lung disease.   She was started on advair HFA 115-31mcg 2 puffs twice daily and feels her breathing has been stable since. HRCT Chest scan does not indicate ILD findings. There was moderate air trapping noted.   PFTs showed mixed obstructive and restrictive defects.    Initial OV 04/10/22 Spirometry on 03/08/22 was suggestive of restrictive pattern and mild diffusion defect.   She was seen by cardiology 01/25/22, note reviewed. She had low risk Nuclear stress test. Echo 12/28/22 showed LV EF 74% and grade I diastolic dysfunction.   She has progressive dyspnea with exertion. She has stopped bowling due to shortness of breath. She used to go for walks and has stopped doing this. She has a cough that is dry. The cough has woken her up from sleep in the past but not recently. No sinus congestion currently. She does have wheezing with the shortness of breath. She reports history of asthma, diagnosed as an adult.   She quit smoking in 2011. She smoked half a pack per day for 30 to 40 years. She reports second hand smoke exposure from her parents in childhood. She worked in [employer], fashion/clothing in the past, [employer] and 3 other companies. No seasonal allergies. She has GERD which is under control at this time. Her mother had lung disease, she was a smoker. No pets at home.   Her mother had RA and maternal grandmother had RA. She was diganosed with RA 6-7 years ago and sees Dr. Deanne Coffer at [facility]. She takes methotrexate 15mg  weekly for a year and a half which has helped her joint pains significantly.   Past Medical History:  Diagnosis Date   Allergy    Arthritis    Asthma    Diabetes mellitus without complication (HCC)    GERD (gastroesophageal reflux disease)    Heart murmur    Grade I/VI 12/2013 at [facility] office   History of carpal tunnel syndrome    Bilateral   History of iron deficiency anemia    Hyperlipidemia    Hypertension    Pneumonia      Family History  Problem Relation Age of Onset   Lung disease Mother    Hypertension Father    Cancer Sister    Diabetes Sister    Cancer Maternal Grandmother    Cancer Maternal Grandfather    Breast cancer Neg Hx      Social History   Socioeconomic History   Marital status: Divorced    Spouse name: Not on file   Number of children: 2   Years of education: Not on file   Highest education level: Not on file  Occupational History   Not on file  Tobacco Use   Smoking status: Former    Packs/day: 0.50    Years: 20.00    Additional pack years: 0.00    Total pack years: 10.00    Types: Cigarettes    Quit date: 2001    Years since quitting:  23.4   Smokeless tobacco: Never  Vaping Use   Vaping Use: Never used  Substance and Sexual Activity   Alcohol use: Not Currently    Comment: seldom   Drug use: No   Sexual activity: Not on file  Other Topics Concern   Not on file  Social History Narrative   Not on file   Social Determinants of Health   Financial Resource Strain: Not on file  Food Insecurity: Not on file  Transportation Needs: Not on file  Physical Activity: Not on file  Stress: Not on file  Social Connections: Not on file  Intimate Partner Violence: Not on file     Allergies  Allergen Reactions   Food Swelling and Other (See Comments)    CUCUMBERS TONGUE SWELLING   Codeine Nausea Only   Methocarbamol Nausea Only   Tramadol Nausea Only    Patient states she tolerates   Voltaren  [Diclofenac Sodium] Rash     Outpatient Medications Prior to Visit  Medication Sig Dispense Refill   acetaminophen (TYLENOL) 500 MG tablet Take 1,000 mg by mouth every 6 (six) hours as needed for moderate pain.     albuterol (PROVENTIL HFA;VENTOLIN HFA) 108 (90 Base) MCG/ACT inhaler Inhale 1-2 puffs into the lungs every 6 (six) hours as needed for wheezing or shortness of breath.     amLODipine (NORVASC) 10 MG tablet Take 10 mg by mouth daily.     betamethasone dipropionate 0.05 % lotion Apply 1 application topically daily as needed (scalp irritation).     chlorthalidone (HYGROTON) 50 MG tablet Take 2 tablets (100 mg total) by mouth daily. 60 tablet 2   Cholecalciferol (VITAMIN D3) 50 MCG (2000 UT) TABS Take 2,000 Units by mouth daily.      Dulaglutide (TRULICITY) 1.5 MG/0.5ML SOPN Inject into the skin.     empagliflozin (JARDIANCE) 25 MG TABS tablet Take 25 mg by mouth daily.     Evolocumab (REPATHA SURECLICK) 140 MG/ML SOAJ Inject into the skin.     fluticasone-salmeterol (ADVAIR HFA) 115-21 MCG/ACT inhaler Inhale 2 puffs into the lungs 2 (two) times daily. 1 each 12   folic acid (FOLVITE) 1 MG tablet Take 1 mg by mouth daily.     methotrexate (RHEUMATREX) 2.5 MG tablet Take 15 mg by mouth once a week.     metoprolol succinate (TOPROL-XL) 25 MG 24 hr tablet Take 1 tablet (25 mg total) by mouth daily. Take with or immediately following a meal. 30 tablet 3   mupirocin ointment (BACTROBAN) 2 % Apply 1 Application topically daily.     potassium chloride (MICRO-K) 10 MEQ CR capsule Take 10 mEq by mouth daily.     telmisartan (MICARDIS) 80 MG tablet Take 80 mg by mouth daily.     nitroGLYCERIN (NITROSTAT) 0.4 MG SL tablet Place 1 tablet (0.4 mg total) under the tongue every 5 (five) minutes as needed for chest pain. 30 tablet 3   No facility-administered medications prior to visit.   Review of Systems  Constitutional:  Negative for chills, fever, malaise/fatigue and weight loss.  HENT:   Negative for congestion, sinus pain and sore throat.   Eyes: Negative.   Respiratory:  Negative for cough, hemoptysis, sputum production, shortness of breath and wheezing.   Cardiovascular:  Negative for chest pain, palpitations, orthopnea, claudication and leg swelling.  Gastrointestinal:  Positive for heartburn. Negative for abdominal pain, nausea and vomiting.  Genitourinary: Negative.   Musculoskeletal:  Positive for joint pain. Negative for myalgias.  Skin:  Negative for rash.  Neurological:  Positive for headaches. Negative for weakness.  Endo/Heme/Allergies: Negative.   Psychiatric/Behavioral: Negative.     Objective:   Vitals:   06/11/22 1359  BP: 128/62  Pulse: 85  SpO2: 98%  Weight: 210 lb (95.3 kg)  Height: 5\' 2"  (1.575 m)     Physical Exam Constitutional:      General: She is not in acute distress.    Appearance: She is obese. She is not ill-appearing.  HENT:     Head: Normocephalic and atraumatic.  Eyes:     General: No scleral icterus.    Conjunctiva/sclera: Conjunctivae normal.  Cardiovascular:     Rate and Rhythm: Normal rate and regular rhythm.     Pulses: Normal pulses.     Heart sounds: Normal heart sounds. No murmur heard. Pulmonary:     Effort: Pulmonary effort is normal.     Breath sounds: Normal breath sounds. No wheezing, rhonchi or rales.  Musculoskeletal:     Right lower leg: No edema.     Left lower leg: No edema.  Skin:    General: Skin is warm and dry.  Neurological:     General: No focal deficit present.     Mental Status: She is alert.    CBC    Component Value Date/Time   WBC 7.0 10/23/2019 1451   RBC 3.82 (L) 10/23/2019 1451   HGB 12.1 10/23/2019 1451   HCT 36.7 10/23/2019 1451   PLT 308 10/23/2019 1451   MCV 96.1 10/23/2019 1451   MCV 92.8 10/30/2013 1614   MCH 31.7 10/23/2019 1451   MCHC 33.0 10/23/2019 1451   RDW 14.8 10/23/2019 1451   LYMPHSABS 2.0 08/22/2018 1022   MONOABS 0.6 08/22/2018 1022   EOSABS 0.4  08/22/2018 1022   BASOSABS 0.1 08/22/2018 1022      Latest Ref Rng & Units 10/23/2019    2:51 PM 08/31/2018    3:26 AM 08/30/2018   12:27 PM  BMP  Glucose 70 - 99 mg/dL 161  096  045   BUN 8 - 23 mg/dL 13  21  9    Creatinine 0.44 - 1.00 mg/dL 4.09  8.11  9.14   Sodium 135 - 145 mmol/L 138  133  135   Potassium 3.5 - 5.1 mmol/L 3.2  3.6  3.3   Chloride 98 - 111 mmol/L 97  93  98   CO2 22 - 32 mmol/L 27  25  22    Calcium 8.9 - 10.3 mg/dL 9.4  9.5  9.1    Chest imaging: HRCT Chest 04/16/22 1. No evidence of fibrotic interstitial lung disease. 2. Moderate air trapping, findings can be seen in the setting of small airways disease. 3. Mild lower lobe linear opacities, likely due to scarring or atelectasis. 4. Scattered small solid pulmonary nodules measuring up to 4 mm. No follow-up needed if patient is low-risk.This recommendation follows the consensus statement: Guidelines for Management of Incidental Pulmonary Nodules Detected on CT Images: From the Fleischner Society 2017; Radiology 2017; 284:228-243. 5. Severe left main and three-vessel coronary artery calcifications. 6. Dilated main pulmonary artery, findings can be seen in the setting of pulmonary arterial hypertension. 7. Aortic Atherosclerosis (ICD10-I70.0).  PFT:    Latest Ref Rng & Units 05/14/2022    3:50 PM 03/08/2022    1:49 PM  PFT Results  FVC-Pre L 1.27  1.20   FVC-Predicted Pre % 48  43   FVC-Post L 1.31    FVC-Predicted Post %  49    Pre FEV1/FVC % % 86  78   Post FEV1/FCV % % 82    FEV1-Pre L 1.10  0.94   FEV1-Predicted Pre % 55  45   FEV1-Post L 1.08    DLCO uncorrected ml/min/mmHg 11.98  13.68   DLCO UNC% % 66  73   DLCO corrected ml/min/mmHg 11.98  13.68   DLCO COR %Predicted % 66  73   DLVA Predicted % 118  120   TLC L 4.39    TLC % Predicted % 92    RV % Predicted % 127      Labs:  Path:  Echo:  Heart Catheterization:    Assessment & Plan:   Mixed restrictive and obstructive lung  disease (HCC) - Plan: Home sleep test  Obesity (BMI 30-39.9) - Plan: Home sleep test  Snoring - Plan: Home sleep test  Discussion: Isabella Oliver is a 72 year old woman, former smoker with DMII, hypertension, hyperlipidemia and rheumatoid arthritis who returns to pulmonary clinic for restrictive lung disease.   Her PFTs show mixed restrictive and obstructive defects. HRCT chest does not show evidence of ILD.   She is to continue advair 115-27mcg 2 puffs twice daily and continue as needed albuterol.   The restrictive defect is likely due to obesity.  We will check home sleep study  Follow up in 6 months.  Melody Comas, MD [facility] Office: [phone]   Current Outpatient Medications:    acetaminophen (TYLENOL) 500 MG tablet, Take 1,000 mg by mouth every 6 (six) hours as needed for moderate pain., Disp: , Rfl:    albuterol (PROVENTIL HFA;VENTOLIN HFA) 108 (90 Base) MCG/ACT inhaler, Inhale 1-2 puffs into the lungs every 6 (six) hours as needed for wheezing or shortness of breath., Disp: , Rfl:    amLODipine (NORVASC) 10 MG tablet, Take 10 mg by mouth daily., Disp: , Rfl:    betamethasone dipropionate 0.05 % lotion, Apply 1 application topically daily as needed (scalp irritation)., Disp: , Rfl:    chlorthalidone (HYGROTON) 50 MG tablet, Take 2 tablets (100 mg total) by mouth daily., Disp: 60 tablet, Rfl: 2   Cholecalciferol (VITAMIN D3) 50 MCG (2000 UT) TABS, Take 2,000 Units by mouth daily. , Disp: , Rfl:    Dulaglutide (TRULICITY) 1.5 MG/0.5ML SOPN, Inject into the skin., Disp: , Rfl:    empagliflozin (JARDIANCE) 25 MG TABS tablet, Take 25 mg by mouth daily., Disp: , Rfl:    Evolocumab (REPATHA SURECLICK) 140 MG/ML SOAJ, Inject into the skin., Disp: , Rfl:    fluticasone-salmeterol (ADVAIR HFA) 115-21 MCG/ACT inhaler, Inhale 2 puffs into the lungs 2 (two) times daily., Disp: 1 each, Rfl: 12   folic acid (FOLVITE) 1 MG tablet, Take 1 mg by mouth daily.,  Disp: , Rfl:    methotrexate (RHEUMATREX) 2.5 MG tablet, Take 15 mg by mouth once a week., Disp: , Rfl:    metoprolol succinate (TOPROL-XL) 25 MG 24 hr tablet, Take 1 tablet (25 mg total) by mouth daily. Take with or immediately following a meal., Disp: 30 tablet, Rfl: 3   mupirocin ointment (BACTROBAN) 2 %, Apply 1 Application topically daily., Disp: , Rfl:    potassium chloride (MICRO-K) 10 MEQ CR capsule, Take 10 mEq by mouth daily., Disp: , Rfl:    telmisartan (MICARDIS) 80 MG tablet, Take 80 mg by mouth daily., Disp: , Rfl:    nitroGLYCERIN (NITROSTAT) 0.4 MG SL tablet, Place 1 tablet (0.4 mg total) under  the tongue every 5 (five) minutes as needed for chest pain., Disp: 30 tablet, Rfl: 3

## 2022-06-11 NOTE — Patient Instructions (Signed)
Your breathing tests show a mixed restrictive and obstructive defect  The inhaler will help with the obstructive defect  Weight loss will help with the restrictive defect  Continue advair inhaler 2 puffs twice daily  Use albuterol inhaler 1-2 puffs every 4-6 hours as needed  We will check a home sleep study  Follow up in 6 months

## 2022-06-22 ENCOUNTER — Encounter: Payer: Self-pay | Admitting: Pulmonary Disease

## 2022-07-13 DIAGNOSIS — G4730 Sleep apnea, unspecified: Secondary | ICD-10-CM | POA: Diagnosis not present

## 2022-07-17 DIAGNOSIS — M79674 Pain in right toe(s): Secondary | ICD-10-CM | POA: Diagnosis not present

## 2022-07-17 DIAGNOSIS — M79671 Pain in right foot: Secondary | ICD-10-CM | POA: Diagnosis not present

## 2022-07-17 DIAGNOSIS — M79672 Pain in left foot: Secondary | ICD-10-CM | POA: Diagnosis not present

## 2022-07-17 DIAGNOSIS — M79675 Pain in left toe(s): Secondary | ICD-10-CM | POA: Diagnosis not present

## 2022-07-17 DIAGNOSIS — I739 Peripheral vascular disease, unspecified: Secondary | ICD-10-CM | POA: Diagnosis not present

## 2022-07-17 DIAGNOSIS — L110 Acquired keratosis follicularis: Secondary | ICD-10-CM | POA: Diagnosis not present

## 2022-07-26 ENCOUNTER — Ambulatory Visit: Payer: Medicare Other | Admitting: Cardiology

## 2022-07-26 DIAGNOSIS — E7800 Pure hypercholesterolemia, unspecified: Secondary | ICD-10-CM | POA: Diagnosis not present

## 2022-07-26 DIAGNOSIS — E1169 Type 2 diabetes mellitus with other specified complication: Secondary | ICD-10-CM | POA: Diagnosis not present

## 2022-07-26 DIAGNOSIS — I700 Atherosclerosis of aorta: Secondary | ICD-10-CM | POA: Diagnosis not present

## 2022-07-26 DIAGNOSIS — J984 Other disorders of lung: Secondary | ICD-10-CM | POA: Diagnosis not present

## 2022-07-26 DIAGNOSIS — Z7960 Long term (current) use of unspecified immunomodulators and immunosuppressants: Secondary | ICD-10-CM | POA: Diagnosis not present

## 2022-07-26 DIAGNOSIS — I10 Essential (primary) hypertension: Secondary | ICD-10-CM | POA: Diagnosis not present

## 2022-07-26 DIAGNOSIS — D84821 Immunodeficiency due to drugs: Secondary | ICD-10-CM | POA: Diagnosis not present

## 2022-07-26 DIAGNOSIS — M069 Rheumatoid arthritis, unspecified: Secondary | ICD-10-CM | POA: Diagnosis not present

## 2022-07-26 DIAGNOSIS — R040 Epistaxis: Secondary | ICD-10-CM | POA: Diagnosis not present

## 2022-07-26 DIAGNOSIS — E119 Type 2 diabetes mellitus without complications: Secondary | ICD-10-CM | POA: Diagnosis not present

## 2022-07-26 DIAGNOSIS — Z0000 Encounter for general adult medical examination without abnormal findings: Secondary | ICD-10-CM | POA: Diagnosis not present

## 2022-07-27 ENCOUNTER — Encounter (INDEPENDENT_AMBULATORY_CARE_PROVIDER_SITE_OTHER): Payer: Medicare Other

## 2022-07-27 DIAGNOSIS — G4733 Obstructive sleep apnea (adult) (pediatric): Secondary | ICD-10-CM

## 2022-07-27 DIAGNOSIS — J449 Chronic obstructive pulmonary disease, unspecified: Secondary | ICD-10-CM

## 2022-07-27 DIAGNOSIS — E669 Obesity, unspecified: Secondary | ICD-10-CM

## 2022-07-27 DIAGNOSIS — R0683 Snoring: Secondary | ICD-10-CM

## 2022-07-27 DIAGNOSIS — J984 Other disorders of lung: Secondary | ICD-10-CM

## 2022-07-30 DIAGNOSIS — Z79899 Other long term (current) drug therapy: Secondary | ICD-10-CM | POA: Diagnosis not present

## 2022-07-30 DIAGNOSIS — M79643 Pain in unspecified hand: Secondary | ICD-10-CM | POA: Diagnosis not present

## 2022-07-30 DIAGNOSIS — N1831 Chronic kidney disease, stage 3a: Secondary | ICD-10-CM | POA: Diagnosis not present

## 2022-07-30 DIAGNOSIS — Z13820 Encounter for screening for osteoporosis: Secondary | ICD-10-CM | POA: Diagnosis not present

## 2022-07-30 DIAGNOSIS — M1990 Unspecified osteoarthritis, unspecified site: Secondary | ICD-10-CM | POA: Diagnosis not present

## 2022-07-30 DIAGNOSIS — M0579 Rheumatoid arthritis with rheumatoid factor of multiple sites without organ or systems involvement: Secondary | ICD-10-CM | POA: Diagnosis not present

## 2022-07-30 DIAGNOSIS — R700 Elevated erythrocyte sedimentation rate: Secondary | ICD-10-CM | POA: Diagnosis not present

## 2022-07-30 DIAGNOSIS — M549 Dorsalgia, unspecified: Secondary | ICD-10-CM | POA: Diagnosis not present

## 2022-07-30 DIAGNOSIS — M25559 Pain in unspecified hip: Secondary | ICD-10-CM | POA: Diagnosis not present

## 2022-08-03 ENCOUNTER — Telehealth: Payer: Self-pay | Admitting: Pulmonary Disease

## 2022-08-03 NOTE — Telephone Encounter (Signed)
Please let patient know she has mild sleep apnea with significant drops in her oxygen levels. Recommend starting auto-CPAP therapy 5-15cmH2O with humidification and mask fitting session to determine appropriate mask. Please place DME orders if patient ok with starting CPAP.  Thanks, JD

## 2022-08-27 ENCOUNTER — Ambulatory Visit: Payer: Medicare Other | Admitting: Cardiology

## 2022-08-27 ENCOUNTER — Encounter: Payer: Self-pay | Admitting: Cardiology

## 2022-08-27 VITALS — BP 149/71 | HR 78 | Resp 16 | Ht 62.0 in | Wt 210.0 lb

## 2022-08-27 DIAGNOSIS — E782 Mixed hyperlipidemia: Secondary | ICD-10-CM | POA: Diagnosis not present

## 2022-08-27 DIAGNOSIS — I10 Essential (primary) hypertension: Secondary | ICD-10-CM | POA: Diagnosis not present

## 2022-08-27 DIAGNOSIS — R0609 Other forms of dyspnea: Secondary | ICD-10-CM

## 2022-08-27 NOTE — Progress Notes (Signed)
Patient referred by [facility] for dyspnea on exertion  Subjective:   Isabella Oliver, female    DOB: [birth year], 72 y.o.   MRN: [id number]   Chief Complaint  Patient presents with   Hypertension   Follow-up    6 months     HPI  72 y.o. African American female with hypertension, hyperlipidemia, type 2 DM, b/l carpal tunnel syndrome, with dyspnea at rest and exertion  Pulmonary workup showed mixed restrictive and obstructive pattern suggestive of COPD and obesity.  She also has mild sleep apnea with significant oxygen desaturations.  She is recommended CPAP by pulmonology.  She has not had any new chest pain.  Blood pressure is elevated today.  Lipid panel with significant improvement noted.     Current Outpatient Medications:    acetaminophen (TYLENOL) 500 MG tablet, Take 1,000 mg by mouth every 6 (six) hours as needed for moderate pain., Disp: , Rfl:    albuterol (PROVENTIL HFA;VENTOLIN HFA) 108 (90 Base) MCG/ACT inhaler, Inhale 1-2 puffs into the lungs every 6 (six) hours as needed for wheezing or shortness of breath., Disp: , Rfl:    amLODipine (NORVASC) 10 MG tablet, Take 10 mg by mouth daily., Disp: , Rfl:    betamethasone dipropionate 0.05 % lotion, Apply 1 application topically daily as needed (scalp irritation)., Disp: , Rfl:    chlorthalidone (HYGROTON) 50 MG tablet, Take 2 tablets (100 mg total) by mouth daily., Disp: 60 tablet, Rfl: 2   Cholecalciferol (VITAMIN D3) 50 MCG (2000 UT) TABS, Take 2,000 Units by mouth daily. , Disp: , Rfl:    Dulaglutide (TRULICITY) 1.5 MG/0.5ML SOPN, Inject into the skin., Disp: , Rfl:    empagliflozin (JARDIANCE) 25 MG TABS tablet, Take 25 mg by mouth daily., Disp: , Rfl:    Evolocumab (REPATHA SURECLICK) 140 MG/ML SOAJ, Inject into the skin., Disp: , Rfl:    fluticasone-salmeterol (ADVAIR HFA) 115-21 MCG/ACT inhaler, Inhale 2 puffs into the lungs 2 (two) times daily., Disp: 1 each, Rfl: 12   folic acid (FOLVITE) 1  MG tablet, Take 1 mg by mouth daily., Disp: , Rfl:    methotrexate (RHEUMATREX) 2.5 MG tablet, Take 15 mg by mouth once a week., Disp: , Rfl:    metoprolol succinate (TOPROL-XL) 25 MG 24 hr tablet, Take 1 tablet (25 mg total) by mouth daily. Take with or immediately following a meal., Disp: 30 tablet, Rfl: 3   mupirocin ointment (BACTROBAN) 2 %, Apply 1 Application topically daily., Disp: , Rfl:    nitroGLYCERIN (NITROSTAT) 0.4 MG SL tablet, Place 1 tablet (0.4 mg total) under the tongue every 5 (five) minutes as needed for chest pain., Disp: 30 tablet, Rfl: 3   potassium chloride (MICRO-K) 10 MEQ CR capsule, Take 10 mEq by mouth daily., Disp: , Rfl:    telmisartan (MICARDIS) 80 MG tablet, Take 80 mg by mouth daily., Disp: , Rfl:    Cardiovascular and other pertinent studies:  Reviewed external labs and tests, independently interpreted  EKG 08/27/2022: Sinus rhythm 78 bpm First degree A-V block  RBBB, LAFB  Regadenoson  Nuclear stress test 01/15/2022: There is soft tissue attenuation artifact in the inferior wall without ischemia or scar. Overall LV systolic function is normal without regional wall motion abnormalities. Stress LV EF: 71%.  Nondiagnostic ECG stress. The heart rate response was consistent with Regadenoson.  No previous exam available for comparison. Low risk.   Echocardiogram 12/27/2021:  Left ventricle cavity is normal in size and  wall thickness. Normal global  wall motion. Normal LV systolic function with EF 74%. Doppler evidence of  grade I (impaired) diastolic dysfunction, normal LAP.  No significant valvular abnormality.  Estimated pulmonary artery systolic pressure 32 mmHg.  No significant change compared to previous study in 2020.   EKG 12/21/2021: Sinus rhythm 78 bpm Incomplete RBBB LAFB  Recent labs: 07/22/2022: Glucose 140, BUN/Cr 19/0.9. EGFR 63. K 4.0 Hb 121. HbA1C 6.5% Chol 155, TG 198, HDL 55, LDL 67  07/21/2021: Glucose 143, BUN/Cr 19/0.9. EGFR  63. K 4.0 HbA1C 6.6% Chol 186, TG 137, HDL 60, LDL 103 TSH 2.6 normal    Review of Systems  Cardiovascular:  Positive for dyspnea on exertion. Negative for chest pain, leg swelling, palpitations and syncope.         Vitals:   08/27/22 1510  BP: (!) 149/71  Pulse: 78  Resp: 16  SpO2: 96%      Body mass index is 38.41 kg/m. Filed Weights   08/27/22 1510  Weight: 210 lb (95.3 kg)      Objective:   Physical Exam Vitals and nursing note reviewed.  Constitutional:      General: She is not in acute distress. Neck:     Vascular: No JVD.  Cardiovascular:     Rate and Rhythm: Normal rate and regular rhythm.     Heart sounds: Murmur heard.     High-pitched blowing holosystolic murmur is present with a grade of 2/6 at the apex.  Pulmonary:     Effort: Pulmonary effort is normal.     Breath sounds: Normal breath sounds. No wheezing or rales.  Musculoskeletal:     Right lower leg: No tenderness. Edema (Trace) present.     Left lower leg: No tenderness. Edema (Trace) present.          Visit diagnoses:   ICD-10-CM   1. Essential hypertension  I10 EKG 12-Lead    2. Mixed hyperlipidemia  E78.2     3. Exertional dyspnea  R06.09           Assessment & Recommendations:    72 y.o. African American female with hypertension, hyperlipidemia, type 2 DM, b/l carpal tunnel syndrome, with exertional chest pain and dyspnea  Dyspnea on exertion: Likely due to COPD, obesity, sleep apnea. Continue follow-up with pulmonology. Unless any new worsening of dyspnea, or chest pain, I do not suspect cardiac etiology for her symptoms. Continue risk factor modification.  Mixed hyperlipidemia: LDL down to 67 with Repatha.  Continue same.  Hypertension: Blood pressure elevated today, generally well-controlled. Keep blood pressure log and recheck in 6 weeks.  Will compare her home blood pressure readings with office readings.    F/u in 1 year   Isabella Negus,  MD Pager: [phone] Office: [phone]

## 2022-09-04 IMAGING — CR DG CHEST 1V PORT
1 series · 1 of 1 positions shown · non-contrast
Comparison: 08/22/2018 chest radiograph and prior.

CLINICAL DATA: Cough, shortness of breath.

EXAM:
PORTABLE CHEST 1 VIEW

[AP]
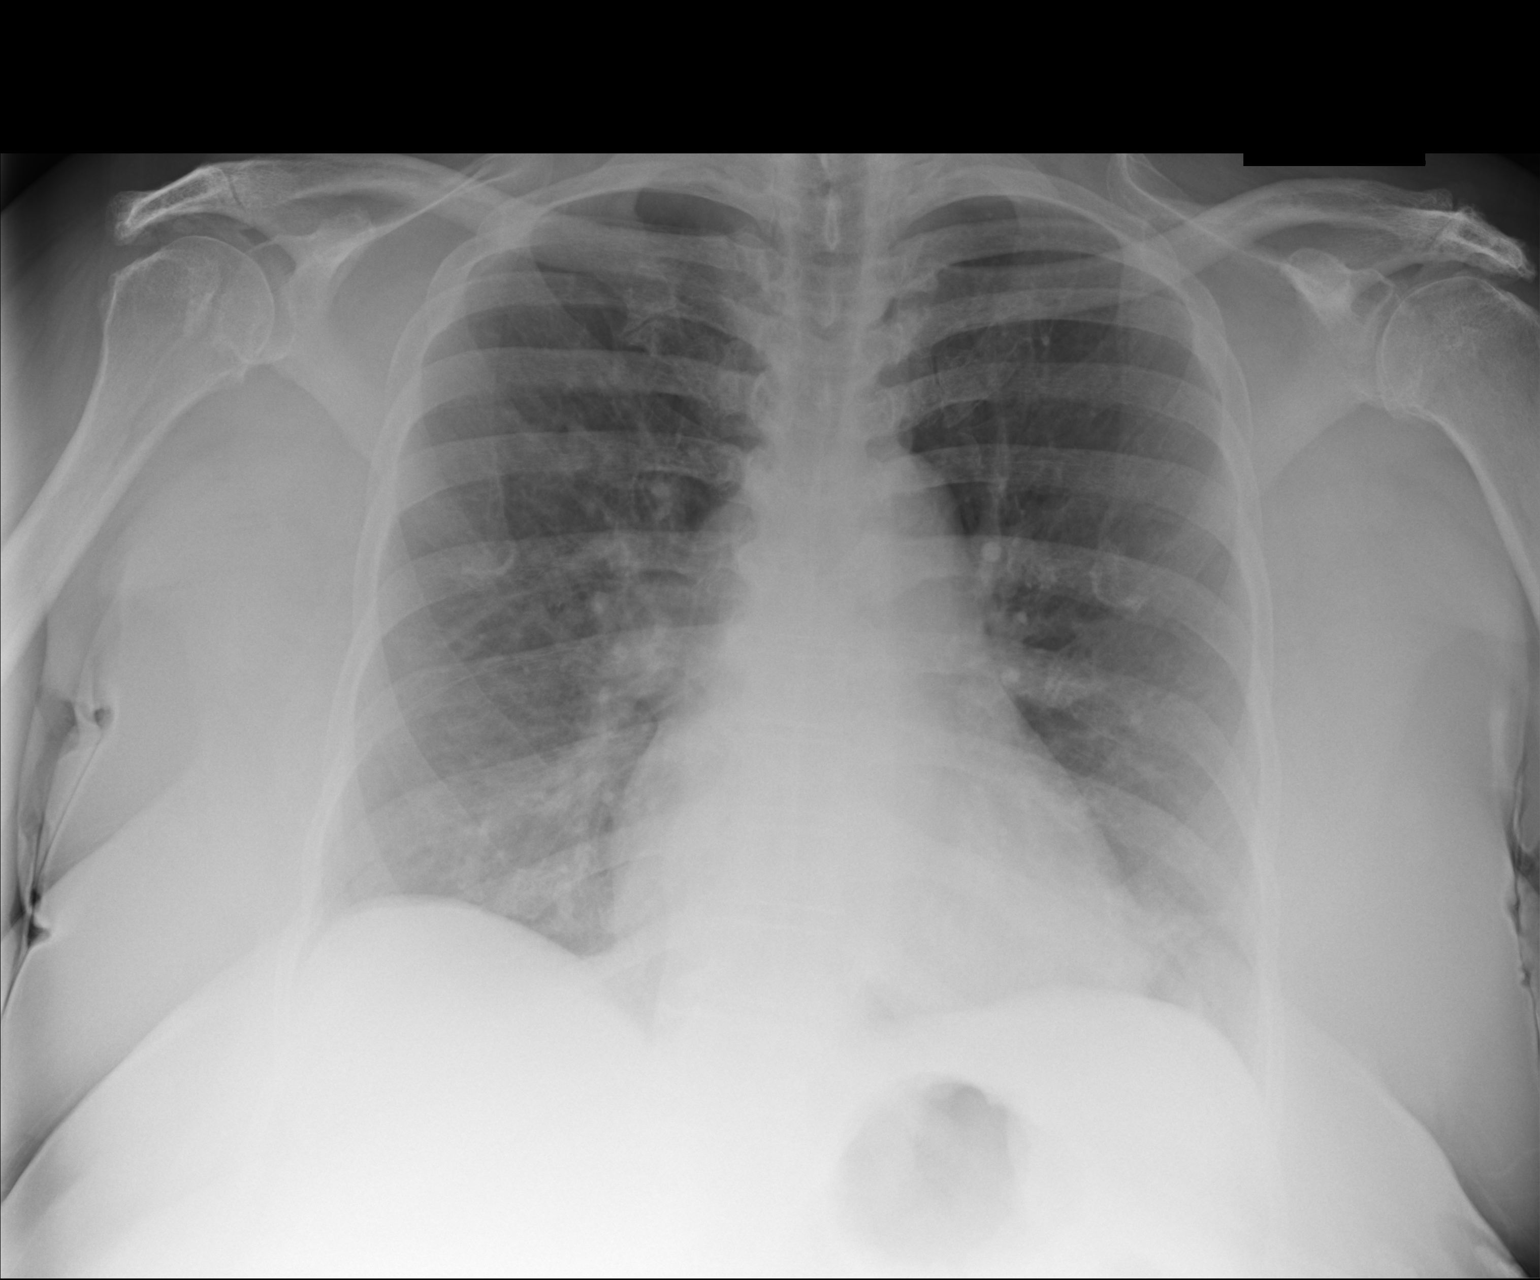

[1 of 1 positions shown; findings below may reference images not displayed]

FINDINGS: Patchy bibasilar opacities. No pneumothorax or pleural effusion.
Cardiomediastinal silhouette within normal limits. No acute osseous
abnormality. Multilevel spondylosis.
IMPRESSION: Bibasilar opacities, which could reflect atelectasis or infection.

## 2022-10-10 ENCOUNTER — Ambulatory Visit: Payer: Medicare Other | Admitting: Cardiology

## 2022-10-16 DIAGNOSIS — I739 Peripheral vascular disease, unspecified: Secondary | ICD-10-CM | POA: Diagnosis not present

## 2022-10-16 DIAGNOSIS — L110 Acquired keratosis follicularis: Secondary | ICD-10-CM | POA: Diagnosis not present

## 2022-10-16 DIAGNOSIS — M79674 Pain in right toe(s): Secondary | ICD-10-CM | POA: Diagnosis not present

## 2022-10-16 DIAGNOSIS — M79675 Pain in left toe(s): Secondary | ICD-10-CM | POA: Diagnosis not present

## 2022-10-16 DIAGNOSIS — M79671 Pain in right foot: Secondary | ICD-10-CM | POA: Diagnosis not present

## 2022-10-16 DIAGNOSIS — M79672 Pain in left foot: Secondary | ICD-10-CM | POA: Diagnosis not present

## 2022-10-30 DIAGNOSIS — M1990 Unspecified osteoarthritis, unspecified site: Secondary | ICD-10-CM | POA: Diagnosis not present

## 2022-10-30 DIAGNOSIS — M79643 Pain in unspecified hand: Secondary | ICD-10-CM | POA: Diagnosis not present

## 2022-10-30 DIAGNOSIS — M549 Dorsalgia, unspecified: Secondary | ICD-10-CM | POA: Diagnosis not present

## 2022-10-30 DIAGNOSIS — N1831 Chronic kidney disease, stage 3a: Secondary | ICD-10-CM | POA: Diagnosis not present

## 2022-10-30 DIAGNOSIS — M0579 Rheumatoid arthritis with rheumatoid factor of multiple sites without organ or systems involvement: Secondary | ICD-10-CM | POA: Diagnosis not present

## 2022-10-30 DIAGNOSIS — Z13820 Encounter for screening for osteoporosis: Secondary | ICD-10-CM | POA: Diagnosis not present

## 2022-10-30 DIAGNOSIS — Z79899 Other long term (current) drug therapy: Secondary | ICD-10-CM | POA: Diagnosis not present

## 2022-10-30 DIAGNOSIS — R700 Elevated erythrocyte sedimentation rate: Secondary | ICD-10-CM | POA: Diagnosis not present

## 2022-11-07 DIAGNOSIS — E7800 Pure hypercholesterolemia, unspecified: Secondary | ICD-10-CM | POA: Diagnosis not present

## 2022-11-07 DIAGNOSIS — Z23 Encounter for immunization: Secondary | ICD-10-CM | POA: Diagnosis not present

## 2022-11-07 DIAGNOSIS — R194 Change in bowel habit: Secondary | ICD-10-CM | POA: Diagnosis not present

## 2022-11-26 DIAGNOSIS — L2089 Other atopic dermatitis: Secondary | ICD-10-CM | POA: Diagnosis not present

## 2022-11-26 DIAGNOSIS — L309 Dermatitis, unspecified: Secondary | ICD-10-CM | POA: Diagnosis not present

## 2022-11-27 ENCOUNTER — Other Ambulatory Visit: Payer: Self-pay | Admitting: Internal Medicine

## 2022-11-27 DIAGNOSIS — Z1231 Encounter for screening mammogram for malignant neoplasm of breast: Secondary | ICD-10-CM

## 2022-11-28 DIAGNOSIS — E119 Type 2 diabetes mellitus without complications: Secondary | ICD-10-CM | POA: Diagnosis not present

## 2022-12-24 DIAGNOSIS — K529 Noninfective gastroenteritis and colitis, unspecified: Secondary | ICD-10-CM | POA: Diagnosis not present

## 2023-01-03 ENCOUNTER — Ambulatory Visit
Admission: RE | Admit: 2023-01-03 | Discharge: 2023-01-03 | Disposition: A | Discharge status: Home / Self Care | Payer: Medicare Other | Source: Ambulatory Visit | Attending: Internal Medicine | Admitting: Internal Medicine

## 2023-01-03 DIAGNOSIS — Z1231 Encounter for screening mammogram for malignant neoplasm of breast: Secondary | ICD-10-CM

## 2023-01-10 DIAGNOSIS — M79671 Pain in right foot: Secondary | ICD-10-CM | POA: Diagnosis not present

## 2023-01-10 DIAGNOSIS — M79672 Pain in left foot: Secondary | ICD-10-CM | POA: Diagnosis not present

## 2023-01-10 DIAGNOSIS — M79675 Pain in left toe(s): Secondary | ICD-10-CM | POA: Diagnosis not present

## 2023-01-10 DIAGNOSIS — L110 Acquired keratosis follicularis: Secondary | ICD-10-CM | POA: Diagnosis not present

## 2023-01-10 DIAGNOSIS — I739 Peripheral vascular disease, unspecified: Secondary | ICD-10-CM | POA: Diagnosis not present

## 2023-01-10 DIAGNOSIS — M79674 Pain in right toe(s): Secondary | ICD-10-CM | POA: Diagnosis not present

## 2023-01-30 DIAGNOSIS — M069 Rheumatoid arthritis, unspecified: Secondary | ICD-10-CM | POA: Diagnosis not present

## 2023-01-30 DIAGNOSIS — I10 Essential (primary) hypertension: Secondary | ICD-10-CM | POA: Diagnosis not present

## 2023-01-30 DIAGNOSIS — R490 Dysphonia: Secondary | ICD-10-CM | POA: Diagnosis not present

## 2023-01-30 DIAGNOSIS — D84821 Immunodeficiency due to drugs: Secondary | ICD-10-CM | POA: Diagnosis not present

## 2023-01-30 DIAGNOSIS — E1169 Type 2 diabetes mellitus with other specified complication: Secondary | ICD-10-CM | POA: Diagnosis not present

## 2023-01-30 DIAGNOSIS — Z7960 Long term (current) use of unspecified immunomodulators and immunosuppressants: Secondary | ICD-10-CM | POA: Diagnosis not present

## 2023-01-30 DIAGNOSIS — Z1211 Encounter for screening for malignant neoplasm of colon: Secondary | ICD-10-CM | POA: Diagnosis not present

## 2023-01-30 DIAGNOSIS — J984 Other disorders of lung: Secondary | ICD-10-CM | POA: Diagnosis not present

## 2023-01-30 DIAGNOSIS — J439 Emphysema, unspecified: Secondary | ICD-10-CM | POA: Diagnosis not present

## 2023-02-19 DIAGNOSIS — K219 Gastro-esophageal reflux disease without esophagitis: Secondary | ICD-10-CM | POA: Diagnosis not present

## 2023-02-19 DIAGNOSIS — R040 Epistaxis: Secondary | ICD-10-CM | POA: Diagnosis not present

## 2023-02-19 DIAGNOSIS — L299 Pruritus, unspecified: Secondary | ICD-10-CM | POA: Diagnosis not present

## 2023-03-07 DIAGNOSIS — Z79899 Other long term (current) drug therapy: Secondary | ICD-10-CM | POA: Diagnosis not present

## 2023-03-07 DIAGNOSIS — M549 Dorsalgia, unspecified: Secondary | ICD-10-CM | POA: Diagnosis not present

## 2023-03-07 DIAGNOSIS — M0579 Rheumatoid arthritis with rheumatoid factor of multiple sites without organ or systems involvement: Secondary | ICD-10-CM | POA: Diagnosis not present

## 2023-03-07 DIAGNOSIS — M79643 Pain in unspecified hand: Secondary | ICD-10-CM | POA: Diagnosis not present

## 2023-03-07 DIAGNOSIS — R700 Elevated erythrocyte sedimentation rate: Secondary | ICD-10-CM | POA: Diagnosis not present

## 2023-03-07 DIAGNOSIS — M1990 Unspecified osteoarthritis, unspecified site: Secondary | ICD-10-CM | POA: Diagnosis not present

## 2023-03-07 DIAGNOSIS — N1831 Chronic kidney disease, stage 3a: Secondary | ICD-10-CM | POA: Diagnosis not present

## 2023-03-21 DIAGNOSIS — M79674 Pain in right toe(s): Secondary | ICD-10-CM | POA: Diagnosis not present

## 2023-03-21 DIAGNOSIS — M79675 Pain in left toe(s): Secondary | ICD-10-CM | POA: Diagnosis not present

## 2023-03-21 DIAGNOSIS — M79671 Pain in right foot: Secondary | ICD-10-CM | POA: Diagnosis not present

## 2023-03-21 DIAGNOSIS — M79672 Pain in left foot: Secondary | ICD-10-CM | POA: Diagnosis not present

## 2023-03-21 DIAGNOSIS — I739 Peripheral vascular disease, unspecified: Secondary | ICD-10-CM | POA: Diagnosis not present

## 2023-03-21 DIAGNOSIS — L110 Acquired keratosis follicularis: Secondary | ICD-10-CM | POA: Diagnosis not present

## 2023-05-29 ENCOUNTER — Other Ambulatory Visit: Payer: Self-pay | Admitting: Pulmonary Disease

## 2023-05-29 DIAGNOSIS — J984 Other disorders of lung: Secondary | ICD-10-CM

## 2023-06-04 DIAGNOSIS — M79643 Pain in unspecified hand: Secondary | ICD-10-CM | POA: Diagnosis not present

## 2023-06-04 DIAGNOSIS — M1990 Unspecified osteoarthritis, unspecified site: Secondary | ICD-10-CM | POA: Diagnosis not present

## 2023-06-04 DIAGNOSIS — M549 Dorsalgia, unspecified: Secondary | ICD-10-CM | POA: Diagnosis not present

## 2023-06-04 DIAGNOSIS — Z79899 Other long term (current) drug therapy: Secondary | ICD-10-CM | POA: Diagnosis not present

## 2023-06-04 DIAGNOSIS — N1831 Chronic kidney disease, stage 3a: Secondary | ICD-10-CM | POA: Diagnosis not present

## 2023-06-04 DIAGNOSIS — R700 Elevated erythrocyte sedimentation rate: Secondary | ICD-10-CM | POA: Diagnosis not present

## 2023-06-04 DIAGNOSIS — M0579 Rheumatoid arthritis with rheumatoid factor of multiple sites without organ or systems involvement: Secondary | ICD-10-CM | POA: Diagnosis not present

## 2023-06-20 DIAGNOSIS — M79672 Pain in left foot: Secondary | ICD-10-CM | POA: Diagnosis not present

## 2023-06-20 DIAGNOSIS — I739 Peripheral vascular disease, unspecified: Secondary | ICD-10-CM | POA: Diagnosis not present

## 2023-06-20 DIAGNOSIS — M79674 Pain in right toe(s): Secondary | ICD-10-CM | POA: Diagnosis not present

## 2023-06-20 DIAGNOSIS — L110 Acquired keratosis follicularis: Secondary | ICD-10-CM | POA: Diagnosis not present

## 2023-06-20 DIAGNOSIS — M79675 Pain in left toe(s): Secondary | ICD-10-CM | POA: Diagnosis not present

## 2023-06-20 DIAGNOSIS — M79671 Pain in right foot: Secondary | ICD-10-CM | POA: Diagnosis not present

## 2023-07-08 DIAGNOSIS — E7800 Pure hypercholesterolemia, unspecified: Secondary | ICD-10-CM | POA: Diagnosis not present

## 2023-07-08 DIAGNOSIS — M069 Rheumatoid arthritis, unspecified: Secondary | ICD-10-CM | POA: Diagnosis not present

## 2023-07-08 DIAGNOSIS — I10 Essential (primary) hypertension: Secondary | ICD-10-CM | POA: Diagnosis not present

## 2023-07-29 DIAGNOSIS — D84821 Immunodeficiency due to drugs: Secondary | ICD-10-CM | POA: Diagnosis not present

## 2023-07-29 DIAGNOSIS — M542 Cervicalgia: Secondary | ICD-10-CM | POA: Diagnosis not present

## 2023-07-29 DIAGNOSIS — Z0000 Encounter for general adult medical examination without abnormal findings: Secondary | ICD-10-CM | POA: Diagnosis not present

## 2023-07-29 DIAGNOSIS — J984 Other disorders of lung: Secondary | ICD-10-CM | POA: Diagnosis not present

## 2023-07-29 DIAGNOSIS — N1832 Chronic kidney disease, stage 3b: Secondary | ICD-10-CM | POA: Diagnosis not present

## 2023-07-29 DIAGNOSIS — E7800 Pure hypercholesterolemia, unspecified: Secondary | ICD-10-CM | POA: Diagnosis not present

## 2023-07-29 DIAGNOSIS — J439 Emphysema, unspecified: Secondary | ICD-10-CM | POA: Diagnosis not present

## 2023-07-29 DIAGNOSIS — M069 Rheumatoid arthritis, unspecified: Secondary | ICD-10-CM | POA: Diagnosis not present

## 2023-07-29 DIAGNOSIS — Z7960 Long term (current) use of unspecified immunomodulators and immunosuppressants: Secondary | ICD-10-CM | POA: Diagnosis not present

## 2023-07-29 DIAGNOSIS — E1169 Type 2 diabetes mellitus with other specified complication: Secondary | ICD-10-CM | POA: Diagnosis not present

## 2023-07-29 DIAGNOSIS — I10 Essential (primary) hypertension: Secondary | ICD-10-CM | POA: Diagnosis not present

## 2023-07-29 DIAGNOSIS — Z23 Encounter for immunization: Secondary | ICD-10-CM | POA: Diagnosis not present

## 2023-08-08 DIAGNOSIS — I10 Essential (primary) hypertension: Secondary | ICD-10-CM | POA: Diagnosis not present

## 2023-08-08 DIAGNOSIS — M069 Rheumatoid arthritis, unspecified: Secondary | ICD-10-CM | POA: Diagnosis not present

## 2023-08-08 DIAGNOSIS — E7800 Pure hypercholesterolemia, unspecified: Secondary | ICD-10-CM | POA: Diagnosis not present

## 2023-08-29 DIAGNOSIS — M79674 Pain in right toe(s): Secondary | ICD-10-CM | POA: Diagnosis not present

## 2023-08-29 DIAGNOSIS — L110 Acquired keratosis follicularis: Secondary | ICD-10-CM | POA: Diagnosis not present

## 2023-08-29 DIAGNOSIS — M79675 Pain in left toe(s): Secondary | ICD-10-CM | POA: Diagnosis not present

## 2023-08-29 DIAGNOSIS — I739 Peripheral vascular disease, unspecified: Secondary | ICD-10-CM | POA: Diagnosis not present

## 2023-08-29 DIAGNOSIS — M79671 Pain in right foot: Secondary | ICD-10-CM | POA: Diagnosis not present

## 2023-08-29 DIAGNOSIS — M79672 Pain in left foot: Secondary | ICD-10-CM | POA: Diagnosis not present

## 2023-09-04 DIAGNOSIS — M1990 Unspecified osteoarthritis, unspecified site: Secondary | ICD-10-CM | POA: Diagnosis not present

## 2023-09-04 DIAGNOSIS — M0579 Rheumatoid arthritis with rheumatoid factor of multiple sites without organ or systems involvement: Secondary | ICD-10-CM | POA: Diagnosis not present

## 2023-09-04 DIAGNOSIS — N1831 Chronic kidney disease, stage 3a: Secondary | ICD-10-CM | POA: Diagnosis not present

## 2023-09-04 DIAGNOSIS — M79643 Pain in unspecified hand: Secondary | ICD-10-CM | POA: Diagnosis not present

## 2023-09-04 DIAGNOSIS — Z79899 Other long term (current) drug therapy: Secondary | ICD-10-CM | POA: Diagnosis not present

## 2023-09-04 DIAGNOSIS — M549 Dorsalgia, unspecified: Secondary | ICD-10-CM | POA: Diagnosis not present

## 2023-09-08 DIAGNOSIS — M069 Rheumatoid arthritis, unspecified: Secondary | ICD-10-CM | POA: Diagnosis not present

## 2023-09-08 DIAGNOSIS — E7800 Pure hypercholesterolemia, unspecified: Secondary | ICD-10-CM | POA: Diagnosis not present

## 2023-09-08 DIAGNOSIS — I10 Essential (primary) hypertension: Secondary | ICD-10-CM | POA: Diagnosis not present

## 2023-10-08 DIAGNOSIS — E7800 Pure hypercholesterolemia, unspecified: Secondary | ICD-10-CM | POA: Diagnosis not present

## 2023-10-08 DIAGNOSIS — M069 Rheumatoid arthritis, unspecified: Secondary | ICD-10-CM | POA: Diagnosis not present

## 2023-10-08 DIAGNOSIS — I10 Essential (primary) hypertension: Secondary | ICD-10-CM | POA: Diagnosis not present

## 2023-11-11 ENCOUNTER — Ambulatory Visit: Attending: Cardiology | Admitting: Cardiology

## 2023-11-11 ENCOUNTER — Encounter: Payer: Self-pay | Admitting: Cardiology

## 2023-11-11 ENCOUNTER — Ambulatory Visit: Attending: Cardiology

## 2023-11-11 ENCOUNTER — Other Ambulatory Visit (HOSPITAL_COMMUNITY): Payer: Self-pay

## 2023-11-11 VITALS — BP 170/76 | HR 69 | Wt 211.8 lb

## 2023-11-11 DIAGNOSIS — R002 Palpitations: Secondary | ICD-10-CM | POA: Diagnosis not present

## 2023-11-11 DIAGNOSIS — E782 Mixed hyperlipidemia: Secondary | ICD-10-CM

## 2023-11-11 DIAGNOSIS — I10 Essential (primary) hypertension: Secondary | ICD-10-CM

## 2023-11-11 DIAGNOSIS — R0609 Other forms of dyspnea: Secondary | ICD-10-CM

## 2023-11-11 MED ORDER — NITROGLYCERIN 0.4 MG SL SUBL: 0.4000 mg | SUBLINGUAL_TABLET | SUBLINGUAL | 3 refills | Status: AC | PRN

## 2023-11-11 MED ORDER — CARVEDILOL 12.5 MG PO TABS: 12.5000 mg | ORAL_TABLET | Freq: Two times a day (BID) | ORAL | 3 refills | Status: DC

## 2023-11-11 MED ORDER — METOPROLOL TARTRATE 100 MG PO TABS
100.0000 mg | ORAL_TABLET | Freq: Once | ORAL | 0 refills | Status: AC
Start: 2023-11-11 — End: 2023-11-28
  Filled 2023-11-11: qty 1, 1d supply, fill #0

## 2023-11-11 NOTE — Progress Notes (Signed)
 Cardiology Office Note:  .   Date:  11/11/2023  ID:  Isabella Oliver, DOB 1950/10/29, MRN [id number] PCP: [facility]  [facility] Providers Cardiologist:  Newman Lawrence, MD PCP: [facility]  Chief Complaint  Patient presents with   Exertional dyspnea     Isabella Oliver is a 73 y.o. female with hypertension, hyperlipidemia, type 2 DM, b/l carpal tunnel syndrome   Discussed the use of AI scribe software for clinical note transcription with the patient, who gave verbal consent to proceed.  History of Present Illness  Patient recently had increase exertional dyspnea and palpitation symptoms, denies any specific chest pain.  She is compliant with her medical therapy.  Blood pressure remains elevated.      Vitals:   11/11/23 1401  BP: (!) 170/76  Pulse: 69  SpO2: 94%      Review of Systems  Cardiovascular:  Positive for dyspnea on exertion and palpitations. Negative for chest pain, leg swelling and syncope.        Studies Reviewed: SABRA        EKG 11/11/2023: Normal sinus rhythm Right bundle branch block Left anterior fascicular block Bifascicular block Cannot rule out Inferior infarct , age undetermined Possible Anterior infarct , age undetermined When compared with ECG of 23-Oct-2019 14:39, No significant change was found     Regadenoson   Nuclear stress test 2024: There is soft tissue attenuation artifact in the inferior wall without ischemia or scar. Overall LV systolic function is normal without regional wall motion abnormalities. Stress LV EF: 71%.  Nondiagnostic ECG stress. The heart rate response was consistent with Regadenoson .  No previous exam available for comparison. Low risk.    Echocardiogram 2023:  Left ventricle cavity is normal in size and wall thickness. Normal global  wall motion. Normal LV systolic function with EF 74%. Doppler evidence of  grade I (impaired) diastolic  dysfunction, normal LAP.  No significant valvular abnormality.  Estimated pulmonary artery systolic pressure 32 mmHg.  No significant change compared to previous study in 2020.   Labs 07/2023: Chol 178, TG 144, HDL 56, LDL 97 HbA1C 6.9%  2023: Hb 12.1 Cr 1.0    Physical Exam Vitals and nursing note reviewed.  Constitutional:      General: She is not in acute distress. Neck:     Vascular: No JVD.  Cardiovascular:     Rate and Rhythm: Normal rate and regular rhythm.     Heart sounds: Normal heart sounds. No murmur heard. Pulmonary:     Effort: Pulmonary effort is normal.     Breath sounds: Normal breath sounds. No wheezing or rales.  Musculoskeletal:     Right lower leg: No edema.     Left lower leg: No edema.     VISIT DIAGNOSES:   ICD-10-CM   1. Essential hypertension  I10 EKG 12-Lead    Basic metabolic panel with GFR    2. Exertional dyspnea  R06.09 CT CORONARY MORPH W/CTA COR W/SCORE W/CA W/CM &/OR WO/CM    metoprolol  tartrate (LOPRESSOR ) 100 MG tablet    ECHOCARDIOGRAM COMPLETE    3. Mixed hyperlipidemia  E78.2 Lipid panel    4. Palpitations  R00.2 LONG TERM MONITOR (3-14 DAYS)    CANCELED: LONG TERM MONITOR (3-14 DAYS)       Isabella Oliver is a 73 y.o. female with hypertension, hyperlipidemia, type 2 DM, b/l carpal tunnel syndrome  Assessment & Plan  Dyspnea on exertion: Concerning  for cardiac etiology and presence of murmur, and risk factors for CAD. Check echocardiogram. Previously, had negative stress test in 2024. Recommend coronary CT angiogram.  Palpitations: Recommend to be cardiac telemetry.  Mixed hyperlipidemia: Patient had recently been noncompliant with Repatha due to technical issues.  She is now back on Repatha.  Check lipid panel.  Hypertension: Uncontrolled.  Increase carvedilol from 6.25 mg twice daily to 12.5 mg twice daily.  Stop metoprolol  succinate 25 mg daily due to duplication of beta-blocker therapy She also on  chlorthalidone  100 mg daily, amlodipine  10 mg daily, Gemzar and 80 mg daily.     Meds ordered this encounter  Medications   metoprolol  tartrate (LOPRESSOR ) 100 MG tablet    Sig: Take 1 tablet (100 mg total) by mouth once for 1 dose. Take 90-120 minutes prior to scan. Hold for SBP less than 110.    Dispense:  1 tablet    Refill:  0   carvedilol (COREG) 12.5 MG tablet    Sig: Take 1 tablet (12.5 mg total) by mouth 2 (two) times daily.    Dispense:  180 tablet    Refill:  3   nitroGLYCERIN  (NITROSTAT ) 0.4 MG SL tablet    Sig: Place 1 tablet (0.4 mg total) under the tongue every 5 (five) minutes as needed for chest pain.    Dispense:  25 tablet    Refill:  3     F/u in 6-8 weeks  Signed, Newman JINNY Lawrence, MD

## 2023-11-11 NOTE — Progress Notes (Unsigned)
 Enrolled for [employer] to mail a ZIO XT long term holter monitor to the patients address on file.

## 2023-11-11 NOTE — Patient Instructions (Addendum)
 Medication Instructions:  STOP Metoprolol  Succinate 25 mg INCREASE Carvedilol to 12.5 mg twice daily ONCE on the day of CT Metoprolol  Tartrate 100mg Take 1 tablet (100 mg total) by mouth once for 1 dose. Take 90-120 minutes prior to scan. Hold for SBP less than 110.   *If you need a refill on your cardiac medications before your next appointment, please call your pharmacy*  Lab Work: BMP, LIPID panel If you have labs (blood work) drawn today and your tests are completely normal, you will receive your results only by: MyChart Message (if you have MyChart) OR A paper copy in the mail If you have any lab test that is abnormal or we need to change your treatment, we will call you to review the results.  Testing/Procedures:  ECHOCARDIOGRAM  Your physician has requested that you have an echocardiogram. Echocardiography is a painless test that uses sound waves to create images of your heart. It provides your doctor with information about the size and shape of your heart and how well your heart's chambers and valves are working. This procedure takes approximately one hour. There are no restrictions for this procedure. Please do NOT wear cologne, perfume, aftershave, or lotions (deodorant is allowed). Please arrive 15 minutes prior to your appointment time.  Please note: We ask at that you not bring children with you during ultrasound (echo/ vascular) testing. Due to room size and safety concerns, children are not allowed in the ultrasound rooms during exams. Our front office staff cannot provide observation of children in our lobby area while testing is being conducted. An adult accompanying a patient to their appointment will only be allowed in the ultrasound room at the discretion of the ultrasound technician under special circumstances. We apologize for any inconvenience.   ZIO 14 DAY MONITOR  Your physician has requested that you wear a Zio heart monitor for __14___ days. This will be mailed to  your home with instructions on how to apply the monitor and how to return it when finished. Please allow 2 weeks after returning the heart monitor before our office calls you with the results.   Coronary CTA   Your physician has requested that you have cardiac CT. Cardiac computed tomography (CT) is a painless test that uses an x-ray machine to take clear, detailed pictures of your heart. For further information please visit [email or web address] Please follow instruction sheet as given.    Follow-Up: At [facility], you and your health needs are our priority.  As part of our continuing mission to provide you with exceptional heart care, our providers are all part of one team.  This team includes your primary Cardiologist (physician) and Advanced Practice Providers or APPs (Physician Assistants and Nurse Practitioners) who all work together to provide you with the care you need, when you need it.  Your next appointment:   6-8  week(s)  Provider:   Newman JINNY Lawrence, MD    We recommend signing up for the patient portal called MyChart.  Sign up information is provided on this After Visit Summary.  MyChart is used to connect with patients for Virtual Visits (Telemedicine).  Patients are able to view lab/test results, encounter notes, upcoming appointments, etc.  Non-urgent messages can be sent to your provider as well.   To learn more about what you can do with MyChart, go to [email or web address].   Other Instructions   Your cardiac CT will be scheduled at one of the below locations:   [facility] [address]  [place], [place] [address]   If scheduled at the [facility] and [facility] at [address], please enter the parking lot using the [address] entrance and use the FREE valet service at the patient drop-off area. Enter the building and check-in with registration on the main floor.   Please follow these instructions carefully  (unless otherwise directed):  An IV will be required for this test and Nitroglycerin  will be given.  Hold all erectile dysfunction medications at least 3 days (72 hrs) prior to test. (Ie viagra, cialis, sildenafil, tadalafil, etc)   On the Night Before the Test: Be sure to Drink plenty of water. Do not consume any caffeinated/decaffeinated beverages or chocolate 12 hours prior to your test. Do not take any antihistamines 12 hours prior to your test.  On the Day of the Test: Drink plenty of water until 1 hour prior to the test. Do not eat any food 1 hour prior to test. You may take your regular medications prior to the test.  Take metoprolol  (Lopressor ) two hours prior to test. If you take Furosemide/Hydrochlorothiazide/Spironolactone/Chlorthalidone , please HOLD on the morning of the test. Patients who wear a continuous glucose monitor MUST remove the device prior to scanning. FEMALES- please wear underwire-free bra if available, avoid dresses & tight clothing       After the Test: Drink plenty of water. After receiving IV contrast, you may experience a mild flushed feeling. This is normal. On occasion, you may experience a mild rash up to 24 hours after the test. This is not dangerous. If this occurs, you can take Benadryl  25 mg, Zyrtec , Claritin, or Allegra and increase your fluid intake. (Patients taking Tikosyn should avoid Benadryl , and may take Zyrtec , Claritin, or Allegra) If you experience trouble breathing, this can be serious. If it is severe call 911 IMMEDIATELY. If it is mild, please call our office.  We will call to schedule your test 2-4 weeks out understanding that some insurance companies will need an authorization prior to the service being performed.   For more information and frequently asked questions, please visit our website : [email or web address]  For non-scheduling related questions, please contact the cardiac imaging nurse navigator should you have  any questions/concerns: Cardiac Imaging Nurse Navigators Direct Office Dial: [phone]   For scheduling needs, including cancellations and rescheduling, please call Brittany, [phone].

## 2023-11-12 LAB — BASIC METABOLIC PANEL WITH GFR
BUN/Creatinine Ratio: 18 (ref 12–28)
BUN: 19 mg/dL (ref 8–27)
CO2: 26 mmol/L (ref 20–29)
Calcium: 10.3 mg/dL (ref 8.7–10.3)
Chloride: 100 mmol/L (ref 96–106)
Creatinine, Ser: 1.03 mg/dL — ABNORMAL HIGH (ref 0.57–1.00)
Glucose: 120 mg/dL — ABNORMAL HIGH (ref 70–99)
Potassium: 4.6 mmol/L (ref 3.5–5.2)
Sodium: 141 mmol/L (ref 134–144)
eGFR: 57 mL/min/1.73 — ABNORMAL LOW (ref >59)

## 2023-11-12 LAB — LIPID PANEL
Chol/HDL Ratio: 2.9 ratio (ref 0.0–4.4)
Cholesterol, Total: 172 mg/dL (ref 100–199)
HDL: 59 mg/dL (ref >39)
LDL Chol Calc (NIH): 87 mg/dL (ref 0–99)
Triglycerides: 148 mg/dL (ref 0–149)
VLDL Cholesterol Cal: 26 mg/dL (ref 5–40)

## 2023-11-28 ENCOUNTER — Encounter: Payer: Self-pay | Admitting: Pulmonary Disease

## 2023-11-28 ENCOUNTER — Ambulatory Visit: Admitting: Pulmonary Disease

## 2023-11-28 VITALS — BP 175/77 | HR 77 | Temp 98.3°F | Ht 62.0 in | Wt 208.4 lb

## 2023-11-28 DIAGNOSIS — J449 Chronic obstructive pulmonary disease, unspecified: Secondary | ICD-10-CM | POA: Diagnosis not present

## 2023-11-28 DIAGNOSIS — Z6838 Body mass index (BMI) 38.0-38.9, adult: Secondary | ICD-10-CM | POA: Diagnosis not present

## 2023-11-28 DIAGNOSIS — G4733 Obstructive sleep apnea (adult) (pediatric): Secondary | ICD-10-CM

## 2023-11-28 DIAGNOSIS — E669 Obesity, unspecified: Secondary | ICD-10-CM

## 2023-11-28 MED ORDER — BREO ELLIPTA 100-25 MCG/ACT IN AEPB: 1.0000 | INHALATION_SPRAY | Freq: Every day | RESPIRATORY_TRACT | 5 refills | Status: AC

## 2023-11-28 NOTE — Patient Instructions (Addendum)
 Start breo ellipta  1 puff daily - rinse mouth out after each use  Stop arnuity ellipta inhaler one on the breo  Use albuterol  inhaler 1-2 puffs every 4-6 hours as needed  Consider starting CPAP therapy for sleep apnea, you have mild sleep apnea  Follow up in 1 year, call sooner if needed

## 2023-11-28 NOTE — Progress Notes (Signed)
 Established Patient Pulmonology Office Visit   Subjective:  Patient ID: Isabella Oliver, female    DOB: Feb 22, 1950  MRN: [id number]  CC:  Chief Complaint  Patient presents with   Follow-up    6 month f/u    Discussed the use of AI scribe software for clinical note transcription with the patient, who gave verbal consent to proceed.  History of Present Illness Isabella Oliver is a 73 year old female with mixed obstructive and restrictive lung disease who presents for follow-up.  She experiences shortness of breath during ambulation, requiring pauses to catch her breath, with less pronounced symptoms at rest. Previously, Advair HFA improved her symptoms, but she discontinued it due to insurance issues and now uses Arnuity, which she finds less effective.  Past pulmonary function tests show moderate air trapping with mixed obstructive and restrictive defects. A high-resolution CT of the chest does not indicate interstitial lung disease.  A home sleep study from June of the previous year shows mild obstructive sleep apnea. CPAP therapy was discussed, but she does not wish to try CPAP at this time.  She has no recent respiratory infections or colds but has a persistent dry cough of unknown origin. She experiences slight leg swelling and mild shortness of breath when walking from the parking lot to the clinic, though it does not require stopping.        ROS    Current Outpatient Medications:    acetaminophen  (TYLENOL ) 500 MG tablet, Take 1,000 mg by mouth every 6 (six) hours as needed for moderate pain., Disp: , Rfl:    albuterol  (PROVENTIL  HFA;VENTOLIN  HFA) 108 (90 Base) MCG/ACT inhaler, Inhale 1-2 puffs into the lungs every 6 (six) hours as needed for wheezing or shortness of breath., Disp: , Rfl:    amLODipine  (NORVASC ) 10 MG tablet, Take 10 mg by mouth daily., Disp: , Rfl:    betamethasone dipropionate 0.05 % lotion, Apply 1 application topically daily as needed (scalp  irritation)., Disp: , Rfl:    carvedilol (COREG) 12.5 MG tablet, Take 1 tablet (12.5 mg total) by mouth 2 (two) times daily., Disp: 180 tablet, Rfl: 3   chlorthalidone  (HYGROTON ) 50 MG tablet, Take 2 tablets (100 mg total) by mouth daily., Disp: 60 tablet, Rfl: 2   Cholecalciferol (VITAMIN D3) 50 MCG (2000 UT) TABS, Take 2,000 Units by mouth daily. , Disp: , Rfl:    Dulaglutide (TRULICITY) 1.5 MG/0.5ML SOPN, Inject into the skin., Disp: , Rfl:    Evolocumab (REPATHA SURECLICK) 140 MG/ML SOAJ, Inject into the skin., Disp: , Rfl:    fluticasone  furoate-vilanterol (BREO ELLIPTA) 100-25 MCG/ACT AEPB, Inhale 1 puff into the lungs daily., Disp: 60 each, Rfl: 5   folic acid (FOLVITE) 1 MG tablet, Take 1 mg by mouth daily., Disp: , Rfl:    metoprolol  tartrate (LOPRESSOR ) 100 MG tablet, Take 1 tablet (100 mg total) by mouth once for 1 dose. Take 90-120 minutes prior to scan. Hold for SBP less than 110., Disp: 1 tablet, Rfl: 0   mupirocin ointment (BACTROBAN) 2 %, Apply 1 Application topically daily., Disp: , Rfl:    nitroGLYCERIN  (NITROSTAT ) 0.4 MG SL tablet, Place 1 tablet (0.4 mg total) under the tongue every 5 (five) minutes as needed for chest pain., Disp: 25 tablet, Rfl: 3   potassium chloride  (MICRO-K ) 10 MEQ CR capsule, Take 10 mEq by mouth daily., Disp: , Rfl:    sulfaSALAzine (AZULFIDINE) 500 MG EC tablet, Take 500 mg by mouth 2 (two) times  daily., Disp: , Rfl:    telmisartan (MICARDIS) 80 MG tablet, Take 80 mg by mouth daily., Disp: , Rfl:       Objective:  BP (!) 175/77   Pulse 77   Temp 98.3 F (36.8 C) (Oral)   Ht 5' 2 (1.575 m)   Wt 208 lb 6.4 oz (94.5 kg)   SpO2 97%   BMI 38.12 kg/m     Physical Exam Constitutional:      General: She is not in acute distress.    Appearance: Normal appearance. She is obese.  Eyes:     General: No scleral icterus.    Conjunctiva/sclera: Conjunctivae normal.  Cardiovascular:     Rate and Rhythm: Normal rate and regular rhythm.  Pulmonary:      Breath sounds: No wheezing, rhonchi or rales.  Musculoskeletal:     Right lower leg: No edema.     Left lower leg: No edema.  Skin:    General: Skin is warm and dry.  Neurological:     General: No focal deficit present.      Diagnostic Review:  Last CBC Lab Results  Component Value Date   WBC 7.0 10/23/2019   HGB 12.1 10/23/2019   HCT 36.7 10/23/2019   MCV 96.1 10/23/2019   MCH 31.7 10/23/2019   RDW 14.8 10/23/2019   PLT 308 10/23/2019   Last metabolic panel Lab Results  Component Value Date   GLUCOSE 120 (H) 11/11/2023   NA 141 11/11/2023   K 4.6 11/11/2023   CL 100 11/11/2023   CO2 26 11/11/2023   BUN 19 11/11/2023   CREATININE 1.03 (H) 11/11/2023   EGFR 57 (L) 11/11/2023   CALCIUM 10.3 11/11/2023   PROT 9.0 (H) 08/22/2018   ALBUMIN 4.4 08/22/2018   BILITOT 0.4 08/22/2018   ALKPHOS 75 08/22/2018   AST 19 08/22/2018   ALT 18 08/22/2018   ANIONGAP 14 10/23/2019       Assessment & Plan:   Assessment & Plan Mixed restrictive and obstructive lung disease (HCC)  Orders:   fluticasone  furoate-vilanterol (BREO ELLIPTA ) 100-25 MCG/ACT AEPB; Inhale 1 puff into the lungs daily.  OSA (obstructive sleep apnea)     Obesity (BMI 30-39.9)      Assessment and Plan Assessment & Plan Mixed Obstructive and Restrictive lung disease Moderate air trapping with mixed defects on PFTs. Previous improvement with Advair HFA, now using Arnuity Ellipta with limited relief.  - Prescribed Breo Ellipta , one puff daily, rinse mouth after use. - Stop arnuity ellipta - Use albuterol  as needed for acute symptoms.  Mild OSA - Discussed CPAP benefits for mild obstructive sleep apnea; she opted to delay initiation.  Preoperative Respiratory Evaluation  ARISCAT Score for Postoperative Pulmonary Complications  Low risk 1.6% risk of in-hospital post-op pulmonary complications (composite including respiratory failure, respiratory infection, pleural effusion, atelectasis,  pneumothorax, bronchospasm, aspiration pneumonitis)  Return in about 1 year (around 11/27/2024) for f/u visit Dr. Kara.   Dorn KATHEE Kara, MD

## 2023-12-13 ENCOUNTER — Ambulatory Visit: Payer: Self-pay | Admitting: Cardiology

## 2023-12-13 DIAGNOSIS — R002 Palpitations: Secondary | ICD-10-CM | POA: Diagnosis not present

## 2023-12-18 ENCOUNTER — Telehealth (HOSPITAL_COMMUNITY): Payer: Self-pay | Admitting: *Deleted

## 2023-12-18 NOTE — Telephone Encounter (Signed)
 Reaching out to patient to offer assistance regarding upcoming cardiac imaging study; pt verbalizes understanding of appt date/time, parking situation and where to check in, pre-test NPO status and medications ordered, and verified current allergies; name and call back number provided for further questions should they arise Sid Seats RN Navigator Cardiac Imaging [facility] [phone] office [phone] cell

## 2023-12-19 ENCOUNTER — Ambulatory Visit (HOSPITAL_COMMUNITY): Admission: RE | Admit: 2023-12-19 | Discharge: 2023-12-19 | Attending: Cardiology

## 2023-12-19 DIAGNOSIS — I2510 Atherosclerotic heart disease of native coronary artery without angina pectoris: Secondary | ICD-10-CM | POA: Diagnosis not present

## 2023-12-19 DIAGNOSIS — R0609 Other forms of dyspnea: Secondary | ICD-10-CM | POA: Insufficient documentation

## 2023-12-19 DIAGNOSIS — R918 Other nonspecific abnormal finding of lung field: Secondary | ICD-10-CM | POA: Insufficient documentation

## 2023-12-19 MED ORDER — IOHEXOL 350 MG/ML SOLN
100.0000 mL | Freq: Once | INTRAVENOUS | Status: AC | PRN
  Administered 2023-12-19: 100 mL via INTRAVENOUS

## 2023-12-19 MED ORDER — NITROGLYCERIN 0.4 MG SL SUBL
0.8000 mg | SUBLINGUAL_TABLET | Freq: Once | SUBLINGUAL | Status: AC
  Administered 2023-12-19: 0.8 mg via SUBLINGUAL

## 2023-12-20 ENCOUNTER — Ambulatory Visit: Payer: Self-pay | Admitting: Cardiology

## 2023-12-20 ENCOUNTER — Other Ambulatory Visit (HOSPITAL_BASED_OUTPATIENT_CLINIC_OR_DEPARTMENT_OTHER): Payer: Self-pay | Admitting: Cardiology

## 2023-12-20 ENCOUNTER — Ambulatory Visit (HOSPITAL_COMMUNITY)
Admission: RE | Admit: 2023-12-20 | Discharge: 2023-12-20 | Disposition: A | Source: Ambulatory Visit | Attending: Cardiology

## 2023-12-20 ENCOUNTER — Ambulatory Visit (HOSPITAL_COMMUNITY): Admission: RE | Admit: 2023-12-20 | Discharge: 2023-12-20 | Attending: Cardiology

## 2023-12-20 DIAGNOSIS — R931 Abnormal findings on diagnostic imaging of heart and coronary circulation: Secondary | ICD-10-CM | POA: Diagnosis present

## 2023-12-20 DIAGNOSIS — R0609 Other forms of dyspnea: Secondary | ICD-10-CM

## 2023-12-20 LAB — ECHOCARDIOGRAM COMPLETE
Area-P 1/2: 3.71 cm2
S' Lateral: 2.3 cm

## 2023-12-20 NOTE — Progress Notes (Signed)
 Reviewed. Will arrange follow up next week to discuss further management options.  Newman JINNY Lawrence, MD

## 2023-12-27 ENCOUNTER — Other Ambulatory Visit (HOSPITAL_COMMUNITY): Payer: Self-pay

## 2023-12-27 ENCOUNTER — Encounter: Payer: Self-pay | Admitting: Cardiology

## 2023-12-27 ENCOUNTER — Ambulatory Visit: Attending: Cardiology | Admitting: Cardiology

## 2023-12-27 VITALS — BP 179/74 | HR 79 | Ht 62.0 in | Wt 211.6 lb

## 2023-12-27 DIAGNOSIS — E782 Mixed hyperlipidemia: Secondary | ICD-10-CM

## 2023-12-27 DIAGNOSIS — I10 Essential (primary) hypertension: Secondary | ICD-10-CM | POA: Diagnosis not present

## 2023-12-27 DIAGNOSIS — I25118 Atherosclerotic heart disease of native coronary artery with other forms of angina pectoris: Secondary | ICD-10-CM | POA: Diagnosis not present

## 2023-12-27 LAB — CBC

## 2023-12-27 MED ORDER — ASPIRIN 81 MG PO TBEC
81.0000 mg | DELAYED_RELEASE_TABLET | Freq: Every day | ORAL | 2 refills | Status: DC
  Filled 2023-12-27: qty 90, 90d supply, fill #0

## 2023-12-27 MED ORDER — AMLODIPINE BESYLATE 10 MG PO TABS
10.0000 mg | ORAL_TABLET | Freq: Every day | ORAL | 1 refills | Status: AC
  Filled 2023-12-27: qty 90, 90d supply, fill #0

## 2023-12-27 NOTE — Patient Instructions (Signed)
 Medication Instructions:    Resume  taking Amlodipine  10 mg daily   Enteric coated  Aspirin  81 mg   daily   *If you need a refill on your cardiac medications before your next appointment, please call your pharmacy*   Lab Work: CBC BMP  If you have labs (blood work) drawn today and your tests are completely normal, you will receive your results only by: MyChart Message (if you have MyChart) OR A paper copy in the mail If you have any lab test that is abnormal or we need to change your treatment, we will call you to review the results.   Testing/Procedures:  Schedule for Jan 01, 2024 at 10 am -- arrive at 8 am Your physician has requested that you have a cardiac catheterization. Cardiac catheterization is used to diagnose and/or treat various heart conditions. Doctors may recommend this procedure for a number of different reasons. The most common reason is to evaluate chest pain. Chest pain can be a symptom of coronary artery disease (CAD), and cardiac catheterization can show whether plaque is narrowing or blocking your hearts arteries. This procedure is also used to evaluate the valves, as well as measure the blood flow and oxygen levels in different parts of your heart. Please follow instruction sheet, as given.    Follow-Up: At [facility], you and your health needs are our priority.  As part of our continuing mission to provide you with exceptional heart care, we have created designated Provider Care Teams.  These Care Teams include your primary Cardiologist (physician) and Advanced Practice Providers (APPs -  Physician Assistants and Nurse Practitioners) who all work together to provide you with the care you need, when you need it.     Your next appointment:      The format for your next appointment:   In Person  Provider:   Newman JINNY Lawrence, MD   Other Instructions     HEARTCARE A DEPT OF [place] HEARTCARE AT [address] A DEPT OF  THE [place]. [facility] [address] [place] [address] Dept: [phone] Loc: [phone]  Isabella Oliver  12/27/2023  You are scheduled for a Cardiac Catheterization on Wednesday, December 24 with Dr. Newman Lawrence.  1. Please arrive at the [facility] (Main Entrance A) at [facility]: [address] [place], [place] [address] at 8:00 AM (This time is 2 hour(s) before your procedure to ensure your preparation).   Free valet parking service is available. You will check in at ADMITTING. The support person will be asked to wait in the waiting room.  It is OK to have someone drop you off and come back when you are ready to be discharged.    Special note: Every effort is made to have your procedure done on time. Please understand that emergencies sometimes delay scheduled procedures.  2. Diet: Nothing to eat after midnight.   3. Hydration: You need to be well hydrated before your procedure. On December 24, you may drink approved liquids (see below) until 2 hours before the procedure, with 16 oz of water as your last intake.   List of approved liquids water, clear juice, clear tea, black coffee, fruit juices, non-citric and without pulp, carbonated beverages, Gatorade, Kool -Aid, plain Jello-O and plain ice popsicles.  4. Labs: You will need to have blood drawn on CBC, BMP today ,  at [facility] (  1st Floor), [address], [place], [place] [address]. You do not need to be fasting.  5. Medication instructions in preparation for your procedure:   Contrast Allergy: No   Stop taking, Micardis (Telmisartan) Wednesday, December 24,, Chlorthalidone   Wednesday, December 24,  Do not take Trulicity  injection until after the procedure on your normal day    On the morning of your procedure, take your Aspirin  81 mg and any morning medicines NOT listed above.  You may use sips of water.  6. Plan to go home the same day,  you will only stay overnight if medically necessary. 7. Bring a current list of your medications and current insurance cards. 8. You MUST have a responsible person to drive you home. 9. Someone MUST be with you the first 24 hours after you arrive home or your discharge will be delayed. 10. Please wear clothes that are easy to get on and off and wear slip-on shoes.  Thank you for allowing us  to care for you!   --  Invasive Cardiovascular services

## 2023-12-27 NOTE — Progress Notes (Signed)
 " Cardiology Office Note:  .   Date:  12/27/2023  ID:  Isabella Oliver, DOB 20-May-1950, MRN [id number] PCP: Dwight Trula SQUIBB, MD  [facility] Providers Cardiologist:  Newman Lawrence, MD PCP: Dwight Trula SQUIBB, MD  Chief Complaint  Patient presents with   Chest Pain     Isabella Oliver is a 73 y.o. female with hypertension, hyperlipidemia, type 2 DM, b/l carpal tunnel syndrome, now with accelerated angina, obstructive CAD  Discussed the use of AI scribe software for clinical note transcription with the patient, who gave verbal consent to proceed.  History of Present Illness  Patient continues to have episodes of chest pain, sometimes 3 times a day, with minimal activity, relieved with rest.  Discussed recent CT angiogram results with the patient, details below.  Pressure is elevated today.  Appears that she is not taking amlodipine  currently.      Vitals:   12/27/23 1553  BP: (!) 179/74  Pulse: 79  SpO2: 97%       Review of Systems  Cardiovascular:  Positive for dyspnea on exertion and palpitations. Negative for chest pain, leg swelling and syncope.        Studies Reviewed: SABRA        EKG 11/11/2023: Normal sinus rhythm Right bundle branch block Left anterior fascicular block Bifascicular block Cannot rule out Inferior infarct , age undetermined Possible Anterior infarct , age undetermined When compared with ECG of 23-Oct-2019 14:39, No significant change was found     Regadenoson   Nuclear stress test 2024: There is soft tissue attenuation artifact in the inferior wall without ischemia or scar. Overall LV systolic function is normal without regional wall motion abnormalities. Stress LV EF: 71%.  Nondiagnostic ECG stress. The heart rate response was consistent with Regadenoson .  No previous exam available for comparison. Low risk.    Echocardiogram 2023:  Left ventricle cavity is normal in size and wall thickness. Normal global  wall motion.  Normal LV systolic function with EF 74%. Doppler evidence of  grade I (impaired) diastolic dysfunction, normal LAP.  No significant valvular abnormality.  Estimated pulmonary artery systolic pressure 32 mmHg.  No significant change compared to previous study in 2020.   Labs 11/2023: Chol 172, TG 148, HDL 59, LDL 87 Cr 1.03  Labs 07/2023: Chol 178, TG 144, HDL 56, LDL 97 HbA1C 6.9%  2023: Hb 12.1 Cr 1.0    Physical Exam Vitals and nursing note reviewed.  Constitutional:      General: She is not in acute distress. Neck:     Vascular: No JVD.  Cardiovascular:     Rate and Rhythm: Normal rate and regular rhythm.     Heart sounds: Normal heart sounds. No murmur heard. Pulmonary:     Effort: Pulmonary effort is normal.     Breath sounds: Normal breath sounds. No wheezing or rales.  Musculoskeletal:     Right lower leg: No edema.     Left lower leg: No edema.     VISIT DIAGNOSES:   ICD-10-CM   1. Coronary artery disease of native artery of native heart with stable angina pectoris  I25.118 Basic metabolic panel with GFR    CBC    2. Mixed hyperlipidemia  E78.2     3. Essential hypertension  I10         Isabella Oliver is a 73 y.o. female with hypertension, hyperlipidemia, type 2 DM, b/l carpal tunnel syndrome, now with accelerated angina, obstructive CAD Assessment &  Plan  Chest pain: Multiple episodes with minimal exertion, on optimal medical therapy.  Coronary CT angiogram suggests either critical proximal RCA stenosis, or CTO.  CTO less likely given absence of collaterals.  Given her continued symptoms, recommend coronary angiography and possible intervention.  Continue aspirin  81 mg daily, Repatha, see below regarding blood pressure control.  Hypertension: Uncontrolled.  She is not taking amlodipine  10 mg daily previously prescribed, resume amlodipine  10 mg daily.  Continue rest of the antihypertensive therapy.  Mixed hyperlipidemia: Continue  Repatha.  Informed Consent   Shared Decision Making/Informed Consent The risks [stroke (1 in 1000), death (1 in 1000), kidney failure [usually temporary] (1 in 500), bleeding (1 in 200), allergic reaction [possibly serious] (1 in 200)], benefits (diagnostic support and management of coronary artery disease) and alternatives of a cardiac catheterization were discussed in detail with Ms. Start and she is willing to proceed.        No orders of the defined types were placed in this encounter.    F/u in 6-8 weeks  Signed, Newman JINNY Lawrence, MD  "

## 2023-12-28 LAB — CBC
Hematocrit: 36.1 % (ref 34.0–46.6)
Hemoglobin: 11.6 g/dL (ref 11.1–15.9)
MCH: 29.9 pg (ref 26.6–33.0)
MCHC: 32.1 g/dL (ref 31.5–35.7)
MCV: 93 fL (ref 79–97)
Platelets: 276 x10E3/uL (ref 150–450)
RBC: 3.88 x10E6/uL (ref 3.77–5.28)
RDW: 12.5 % (ref 11.7–15.4)
WBC: 6.1 x10E3/uL (ref 3.4–10.8)

## 2023-12-28 LAB — BASIC METABOLIC PANEL WITH GFR
BUN/Creatinine Ratio: 14 (ref 12–28)
BUN: 17 mg/dL (ref 8–27)
CO2: 25 mmol/L (ref 20–29)
Calcium: 10.6 mg/dL — ABNORMAL HIGH (ref 8.7–10.3)
Chloride: 104 mmol/L (ref 96–106)
Creatinine, Ser: 1.19 mg/dL — ABNORMAL HIGH (ref 0.57–1.00)
Glucose: 99 mg/dL (ref 70–99)
Potassium: 4.6 mmol/L (ref 3.5–5.2)
Sodium: 141 mmol/L (ref 134–144)
eGFR: 48 mL/min/1.73 — ABNORMAL LOW

## 2024-01-01 ENCOUNTER — Other Ambulatory Visit: Payer: Self-pay

## 2024-01-01 ENCOUNTER — Other Ambulatory Visit (HOSPITAL_COMMUNITY): Payer: Self-pay

## 2024-01-01 ENCOUNTER — Ambulatory Visit (HOSPITAL_COMMUNITY)
Admission: RE | Admit: 2024-01-01 | Discharge: 2024-01-01 | Disposition: A | Discharge status: Home / Self Care | Attending: Cardiology | Admitting: Cardiology

## 2024-01-01 ENCOUNTER — Encounter (HOSPITAL_COMMUNITY)
Admission: RE | Disposition: A | Discharge status: Home / Self Care | Payer: Self-pay | Source: Home / Self Care | Attending: Cardiology

## 2024-01-01 DIAGNOSIS — Z7982 Long term (current) use of aspirin: Secondary | ICD-10-CM | POA: Diagnosis not present

## 2024-01-01 DIAGNOSIS — G5602 Carpal tunnel syndrome, left upper limb: Secondary | ICD-10-CM | POA: Diagnosis not present

## 2024-01-01 DIAGNOSIS — I2510 Atherosclerotic heart disease of native coronary artery without angina pectoris: Secondary | ICD-10-CM | POA: Insufficient documentation

## 2024-01-01 DIAGNOSIS — I10 Essential (primary) hypertension: Secondary | ICD-10-CM | POA: Diagnosis not present

## 2024-01-01 DIAGNOSIS — Z7985 Long-term (current) use of injectable non-insulin antidiabetic drugs: Secondary | ICD-10-CM | POA: Insufficient documentation

## 2024-01-01 DIAGNOSIS — Z7902 Long term (current) use of antithrombotics/antiplatelets: Secondary | ICD-10-CM | POA: Insufficient documentation

## 2024-01-01 DIAGNOSIS — I25118 Atherosclerotic heart disease of native coronary artery with other forms of angina pectoris: Secondary | ICD-10-CM | POA: Diagnosis present

## 2024-01-01 DIAGNOSIS — Z79899 Other long term (current) drug therapy: Secondary | ICD-10-CM | POA: Insufficient documentation

## 2024-01-01 DIAGNOSIS — E119 Type 2 diabetes mellitus without complications: Secondary | ICD-10-CM | POA: Diagnosis not present

## 2024-01-01 DIAGNOSIS — I2584 Coronary atherosclerosis due to calcified coronary lesion: Secondary | ICD-10-CM | POA: Insufficient documentation

## 2024-01-01 DIAGNOSIS — E782 Mixed hyperlipidemia: Secondary | ICD-10-CM | POA: Diagnosis not present

## 2024-01-01 DIAGNOSIS — Z955 Presence of coronary angioplasty implant and graft: Secondary | ICD-10-CM

## 2024-01-01 HISTORY — PX: CORONARY STENT INTERVENTION: CATH118234

## 2024-01-01 HISTORY — PX: CORONARY ULTRASOUND/IVUS: CATH118244

## 2024-01-01 HISTORY — PX: LEFT HEART CATH AND CORONARY ANGIOGRAPHY: CATH118249

## 2024-01-01 LAB — GLUCOSE, CAPILLARY
Glucose-Capillary: 133 mg/dL — ABNORMAL HIGH (ref 70–99)
Glucose-Capillary: 161 mg/dL — ABNORMAL HIGH (ref 70–99)

## 2024-01-01 SURGERY — LEFT HEART CATH AND CORONARY ANGIOGRAPHY
Anesthesia: LOCAL

## 2024-01-01 MED ORDER — LABETALOL HCL 5 MG/ML IV SOLN
10.0000 mg | INTRAVENOUS | Status: DC | PRN
  Administered 2024-01-01: 10 mg via INTRAVENOUS
  Filled 2024-01-01: qty 4

## 2024-01-01 MED ORDER — ACETAMINOPHEN 325 MG PO TABS: 650.0000 mg | ORAL_TABLET | ORAL | Status: DC | PRN

## 2024-01-01 MED ORDER — LIDOCAINE HCL (PF) 1 % IJ SOLN
INTRAMUSCULAR | Status: DC | PRN
  Administered 2024-01-01: 2 mL

## 2024-01-01 MED ORDER — VERAPAMIL HCL 2.5 MG/ML IV SOLN
INTRAVENOUS | Status: AC
  Filled 2024-01-01: qty 2

## 2024-01-01 MED ORDER — IOHEXOL 350 MG/ML SOLN
INTRAVENOUS | Status: DC | PRN
  Administered 2024-01-01: 125 mL

## 2024-01-01 MED ORDER — SODIUM CHLORIDE 0.9 % IV SOLN
INTRAVENOUS | Status: DC | PRN
  Administered 2024-01-01: 10 mL/h via INTRAVENOUS

## 2024-01-01 MED ORDER — SODIUM CHLORIDE 0.9 % IV SOLN: 250.0000 mL | INTRAVENOUS | Status: DC | PRN

## 2024-01-01 MED ORDER — SODIUM CHLORIDE 0.9% FLUSH: 3.0000 mL | Freq: Two times a day (BID) | INTRAVENOUS | Status: DC

## 2024-01-01 MED ORDER — HYDRALAZINE HCL 20 MG/ML IJ SOLN
INTRAMUSCULAR | Status: DC | PRN
  Administered 2024-01-01 (×2): 10 mg via INTRAVENOUS

## 2024-01-01 MED ORDER — HYDRALAZINE HCL 20 MG/ML IJ SOLN
10.0000 mg | INTRAMUSCULAR | Status: DC | PRN
  Administered 2024-01-01: 10 mg via INTRAVENOUS
  Filled 2024-01-01: qty 1

## 2024-01-01 MED ORDER — CLOPIDOGREL BISULFATE 75 MG PO TABS: 75.0000 mg | ORAL_TABLET | Freq: Every day | ORAL | Status: DC

## 2024-01-01 MED ORDER — VERAPAMIL HCL 2.5 MG/ML IV SOLN: INTRAVENOUS | Status: DC | PRN

## 2024-01-01 MED ORDER — FAMOTIDINE IN NACL 20-0.9 MG/50ML-% IV SOLN
INTRAVENOUS | Status: DC | PRN
  Administered 2024-01-01: 20 mg via INTRAVENOUS

## 2024-01-01 MED ORDER — FREE WATER: 500.0000 mL | Freq: Once | Status: DC

## 2024-01-01 MED ORDER — LIDOCAINE HCL (PF) 1 % IJ SOLN
INTRAMUSCULAR | Status: AC
  Filled 2024-01-01: qty 30

## 2024-01-01 MED ORDER — FENTANYL CITRATE (PF) 100 MCG/2ML IJ SOLN
INTRAMUSCULAR | Status: AC
  Filled 2024-01-01: qty 2

## 2024-01-01 MED ORDER — FENTANYL CITRATE (PF) 100 MCG/2ML IJ SOLN
INTRAMUSCULAR | Status: DC | PRN
  Administered 2024-01-01: 50 ug via INTRAVENOUS
  Administered 2024-01-01: 25 ug via INTRAVENOUS

## 2024-01-01 MED ORDER — HEPARIN (PORCINE) IN NACL 1000-0.9 UT/500ML-% IV SOLN
INTRAVENOUS | Status: DC | PRN
  Administered 2024-01-01 (×2): 500 mL

## 2024-01-01 MED ORDER — ONDANSETRON HCL 4 MG/2ML IJ SOLN
4.0000 mg | Freq: Four times a day (QID) | INTRAMUSCULAR | Status: DC | PRN
  Administered 2024-01-01: 4 mg via INTRAVENOUS
  Filled 2024-01-01: qty 2

## 2024-01-01 MED ORDER — ASPIRIN 81 MG PO CHEW: 81.0000 mg | CHEWABLE_TABLET | ORAL | Status: DC

## 2024-01-01 MED ORDER — MIDAZOLAM HCL (PF) 2 MG/2ML IJ SOLN
INTRAMUSCULAR | Status: DC | PRN
  Administered 2024-01-01 (×2): 1 mg via INTRAVENOUS

## 2024-01-01 MED ORDER — HEPARIN SODIUM (PORCINE) 1000 UNIT/ML IJ SOLN
INTRAMUSCULAR | Status: AC
  Filled 2024-01-01: qty 10

## 2024-01-01 MED ORDER — NITROGLYCERIN 1 MG/10 ML FOR IR/CATH LAB
INTRA_ARTERIAL | Status: AC
  Filled 2024-01-01: qty 10

## 2024-01-01 MED ORDER — MIDAZOLAM HCL 2 MG/2ML IJ SOLN
INTRAMUSCULAR | Status: AC
  Filled 2024-01-01: qty 2

## 2024-01-01 MED ORDER — VERAPAMIL HCL 2.5 MG/ML IV SOLN
INTRAVENOUS | Status: DC | PRN
  Administered 2024-01-01: 10 mL via INTRA_ARTERIAL

## 2024-01-01 MED ORDER — SODIUM CHLORIDE 0.9% FLUSH: 3.0000 mL | INTRAVENOUS | Status: DC | PRN

## 2024-01-01 MED ORDER — CLOPIDOGREL BISULFATE 75 MG PO TABS
75.0000 mg | ORAL_TABLET | Freq: Every day | ORAL | 3 refills | Status: DC
  Filled 2024-01-01: qty 90, 90d supply, fill #0

## 2024-01-01 MED ORDER — FAMOTIDINE IN NACL 20-0.9 MG/50ML-% IV SOLN
INTRAVENOUS | Status: AC
  Filled 2024-01-01: qty 50

## 2024-01-01 MED ORDER — HEPARIN SODIUM (PORCINE) 1000 UNIT/ML IJ SOLN
INTRAMUSCULAR | Status: DC | PRN
  Administered 2024-01-01: 4000 [IU] via INTRAVENOUS
  Administered 2024-01-01 (×2): 5000 [IU] via INTRAVENOUS

## 2024-01-01 MED ORDER — CLOPIDOGREL BISULFATE 300 MG PO TABS
ORAL_TABLET | ORAL | Status: DC | PRN
  Administered 2024-01-01: 600 mg via ORAL

## 2024-01-01 SURGICAL SUPPLY — 21 items
BALLOON EMERGE MR 2.5X8 (BALLOONS) IMPLANT
BALLOON EMERGE MR 4.0X8 (BALLOONS) IMPLANT
BALLOON SAPPHIRE NC24 4.5X12 (BALLOONS) IMPLANT
BALLOON WOLVERINE 4.00X6 (BALLOONS) IMPLANT
CATH 5FR JL3.5 JR4 ANG PIG MP (CATHETERS) IMPLANT
CATH INFINITI AMBI 5FR TG (CATHETERS) IMPLANT
CATH IV OPTICROSS HD 3FRX135 (CATHETERS) IMPLANT
CATH LAUNCHER 6FR AL.75 (CATHETERS) IMPLANT
CATH VISTA GUIDE 6FR JR4 ECOPK (CATHETERS) IMPLANT
DEVICE RAD COMP TR BAND LRG (VASCULAR PRODUCTS) IMPLANT
DRAPE IVUS SLED (BAG) IMPLANT
GLIDESHEATH SLEND A-KIT 6F 22G (SHEATH) IMPLANT
GUIDEWIRE INQWIRE 1.5J.035X260 (WIRE) IMPLANT
KIT ENCORE 26 ADVANTAGE (KITS) IMPLANT
KIT HEMO VALVE WATCHDOG (MISCELLANEOUS) IMPLANT
PACK CARDIAC CATHETERIZATION (CUSTOM PROCEDURE TRAY) ×1 IMPLANT
SET ATX-X65L (MISCELLANEOUS) IMPLANT
SHEATH PROBE COVER 6X72 (BAG) IMPLANT
STENT MEGATRON 4.0X16 (Permanent Stent) IMPLANT
WIRE HI TORQ WHISPER MS 190CM (WIRE) IMPLANT
WIRE RUNTHROUGH IZANAI 014 180 (WIRE) IMPLANT

## 2024-01-01 NOTE — Progress Notes (Signed)
 CARDIAC REHAB PHASE I     Post stent education including site care, restrictions, risk factors, exercise guidelines, NTG use, antiplatelet therapy importance, heart healthy diabetic diet, and CRP2 reviewed. All questions and concerns addressed. Will refer to [facility] for CRP2. Plan for home later today.    2:50-3:15 Isabella JAYSON Liverpool, RN BSN 01/01/2024 3:14 PM

## 2024-01-01 NOTE — Discharge Summary (Signed)
 "   Discharge Summary for Same Day PCI   Patient ID: Isabella Oliver MRN: [id number]; DOB: 1950-05-13  Admit date: 01/01/2024 Discharge date: 01/01/2024  Primary Care Provider: Dwight Trula SQUIBB, MD  Primary Cardiologist: Newman JINNY Lawrence, MD  Primary Electrophysiologist:  None   Discharge Diagnoses    Active Problems:   CAD (coronary artery disease)    Diagnostic Studies/Procedures    Cardiac Catheterization 01/01/2024:  M: Normal LAD: Minimal luminal irregularities Lcx: No significant disease RCA: Dominant vessel          Ostial-proximal 95% stenosis          Proximal 30% stenosis          Distal 40% stenosis   LVEDP 24 mmHg   Successful percutaneous coronary intervention ostial-prox RCA        Intravascular ultrasound (IVUS)        PTCA and stent placement 4.0 X 16 mm Synergy Megatron drug-eluting stent        Postdilatation using 4.5 x 12 mm [place] balloon up to 14 atm        Stenosis 95%-->0%, TIMI flow II-->III        Patient was hypertensive throughout the case and received IV hydralazine  20 mg. _____________   History of Present Illness     Isabella Oliver is a 73 y.o. female with hypertension, hyperlipidemia, type 2 DM, b/l carpal tunnel syndrome.   Patient was seen now patient reporting multiple episodes of chest pain with minimal exertion despite optimal medical therapy.  Coronary CTA demonstrating critical proximal RCA stenosis or CTO.  Cardiac catheterization was arranged for further evaluation.  Hospital Course     The patient underwent cardiac cath as noted above with successful PTCA to the ostial-proximal RCA with DES. Plan for DAPT with ASA/Plavix  for at least 1 year then monotherapy with Plavix  thereafter. The patient was seen by cardiac rehab while in short stay. There were no observed complications post cath. Radial cath site was re-evaluated prior to discharge and found to be stable without any complications. Instructions/precautions  regarding cath site care were given prior to discharge.  Of note: Rapid response was called for patient not able to find her words.  However no evidence of focal deficits on stroke exam.  Rapid response team and MD did not recommend CT scan given low suspicion for stroke.  She was given Zofran  for nausea/vomiting which improved with time and was monitored for at least 2 hours after the fact without any recurrent symptoms and complete resolution.  Isabella Oliver was seen by Dr. Lawrence and determined stable for discharge home. Follow up with our office has been arranged. Medications are listed below.   _____________  Cath/PCI Registry Performance & Quality Measures: Aspirin  prescribed? - Yes ADP Receptor Inhibitor (Plavix /Clopidogrel , Brilinta/Ticagrelor or Effient/Prasugrel) prescribed (includes medically managed patients)? - Yes High Intensity Statin (Lipitor 40-80mg  or Crestor 20-40mg ) prescribed? - No - on repatha For EF <40%, was ACEI/ARB prescribed? - Not Applicable (EF >/= 40%) For EF <40%, Aldosterone Antagonist (Spironolactone or Eplerenone) prescribed? - Not Applicable (EF >/= 40%) Cardiac Rehab Phase II ordered (Included Medically managed Patients)? - Yes  _____________   Discharge Vitals Blood pressure (!) 156/68, pulse 79, temperature 98 F (36.7 C), resp. rate (!) 22, height 5' 2 (1.575 m), weight 95.7 kg, SpO2 95%.  Filed Weights   01/01/24 0835  Weight: 95.7 kg    Last Labs & Radiologic Studies    CBC No results for  input(s): WBC, NEUTROABS, HGB, HCT, MCV, PLT in the last 72 hours. Basic Metabolic Panel No results for input(s): NA, K, CL, CO2, GLUCOSE, BUN, CREATININE, CALCIUM, MG, PHOS in the last 72 hours. Liver Function Tests No results for input(s): AST, ALT, ALKPHOS, BILITOT, PROT, ALBUMIN in the last 72 hours. No results for input(s): LIPASE, AMYLASE in the last 72 hours. High Sensitivity Troponin:   No  results for input(s): TROPONINIHS in the last 720 hours.  BNP Invalid input(s): POCBNP D-Dimer No results for input(s): DDIMER in the last 72 hours. Hemoglobin A1C No results for input(s): HGBA1C in the last 72 hours. Fasting Lipid Panel No results for input(s): CHOL, HDL, LDLCALC, TRIG, CHOLHDL, LDLDIRECT in the last 72 hours. Thyroid  Function Tests No results for input(s): TSH, T4TOTAL, T3FREE, THYROIDAB in the last 72 hours.  Invalid input(s): FREET3 _____________  CARDIAC CATHETERIZATION Result Date: 01/01/2024 Images from the original result were not included. Coronary angiography & intervention 01/01/2024: LM: Normal LAD: Minimal luminal irregularities Lcx: No significant disease RCA: Dominant vessel          Ostial-proximal 95% stenosis          Proximal 30% stenosis          Distal 40% stenosis LVEDP 24 mmHg Successful percutaneous coronary intervention ostial-prox RCA        Intravascular ultrasound (IVUS)        PTCA and stent placement 4.0 X 16 mm Synergy Megatron drug-eluting stent        Postdilatation using 4.5 x 12 mm [place] balloon up to 14 atm        Stenosis 95%-->0%, TIMI flow II-->III Patient was hypertensive throughout the case and received IV hydralazine  20 mg. Newman JINNY Lawrence, MD  CT CORONARY MORPH W/CTA COR W/SCORE DEL W/CM &/OR WO/CM Addendum Date: 12/21/2023 ADDENDUM REPORT: 12/21/2023 05:25 EXAM: OVER-READ INTERPRETATION  CT CHEST The following report is an over-read performed by radiologist Dr. Suzen Dials of [facility] on 12/21/2023. This over-read does not include interpretation of cardiac or coronary anatomy or pathology. The coronary calcium score/coronary CTA interpretation by the cardiologist is attached. COMPARISON:  April 16, 2022 FINDINGS: Cardiovascular: There are no significant extracardiac vascular findings. Mediastinum/Nodes: There are no enlarged lymph nodes within the visualized mediastinum. Lungs/Pleura:  There is no pleural effusion. There is mild left upper lobe linear atelectasis. A stable, ill-defined 4 mm posterolateral right upper lobe pulmonary nodule versus focal atelectasis is noted (axial CT image 8, CT series 304). A stable 3 mm posterior left basilar lung nodule is present (axial CT image 187, CT series 304). Upper abdomen: No significant findings in the visualized upper abdomen. Musculoskeletal/Chest wall: No chest wall mass or suspicious osseous findings within the visualized chest. IMPRESSION: Stable bilateral 3 mm and 4 mm subcentimeter pulmonary nodules, as described above. No follow-up needed if patient is low-risk (and has no known or suspected primary neoplasm). Non-contrast chest CT can be considered in 12 months if patient is high-risk. This recommendation follows the consensus statement: Guidelines for Management of Incidental Pulmonary Nodules Detected on CT Images: From the Fleischner Society 2017; Radiology 2017; 284:228-243. Electronically Signed   By: Suzen Dials M.D.   On: 12/21/2023 05:25   Result Date: 12/21/2023 CLINICAL DATA:  Dyspnea on exertion (DOE) EXAM: Cardiac/Coronary CTA TECHNIQUE: A non-contrast, gated CT scan was obtained with axial slices of 2.5 mm through the heart for calcium scoring. Calcium scoring was performed using the Agatston method. A 120 kV prospective, gated, contrast  cardiac CT scan was obtained. Gantry rotation speed was 230 msec and collimation was 0.63 mm. Two sublingual nitroglycerin  tablets (0.8 mg) were given. The 3D data set was reconstructed with motion correction for the best systolic or diastolic phase. Images were analyzed on a dedicated workstation using MPR, MIP, and VRT modes. The patient received 100mL OMNIPAQUE  IOHEXOL  350 MG/ML SOLN cc of contrast. FINDINGS: Image quality: Fair Noise artifact is: Moderate Calcium score: Calcium score is 1108, which is 97th percentile. Coronary Arteries:  Normal coronary origin.  Right dominance. Left  main: Normal caliber vessel. Diffuse mixed calcified and noncalcified plaque with 25-49% stenosis. Left anterior descending artery: Normal caliber vessel. Diffuse mixed calcified and noncalcified plaque with 25-49% stenosis in the mid vessel. The LAD gives off large, branching first diagonal with predominantly noncalcified plaque and 1-24% stenosis. Left circumflex artery: Normal caliber, non-dominant. Scattered mixed calcified and noncalcified plaque with 1-24% proximal stenosis, though vessel becomes small caliber after OM2. The LCX gives off small first and medium second obtuse marginal branches. Right coronary artery: Normal caliber, gives rise to the PDA. There is severe filling defect in the proximal vessel consistent with either chronic total occlusion with reconstitution (though collaterals not visualized) or 99% stenosis with predominantly noncalcified plaque at this stenosis. Diffuse mixed calcified and noncalcified plaque seen in mid to distal vessel, difficult to assess degree of stenosis due to signal to noise artifact. Right Atrium: Right atrial size is visually normal. Right Ventricle: The right ventricular cavity is visually normal. Left Atrium: Left atrial size is visually normal with no left atrial appendage filling defect. Left Ventricle: The ventricular cavity size is visually normal. Pulmonary arteries: Dilated size (33 mm). Pulmonary veins: Normal pulmonary venous drainage. Pericardium: Normal thickness without significant effusion or calcium present. Cardiac valves: The aortic valve is trileaflet without significant calcification. The mitral valve is normal without significant calcification. Aorta: Normal caliber with diffuse atherosclerosis. Extra-cardiac findings: See attached radiology report for non-cardiac structures. IMPRESSION: 1. Proximal RCA with either chronic total occlusion with distal reconstitution or 99% stenosis, CADRADS 4 or 5. CT-FFR will be performed and reported separately,  though heartflow roadmap is modeling as a CTO, so may not be able to evaluate RCA. Findings communicated with Dr. Elmira. Image saved in PACS. 2. Coronary calcium score of 1108. This was 97th percentile for age-, sex, and race-matched controls. 3.  Dilated pulmonary artery of 33 mm 4. Normal coronary origin with right dominance. 5.  Presence of signal to noise artifact. RECOMMENDATIONS: CAD-RADS 4: Severe stenosis. (70-99% or > 50% left main). Cardiac catheterization or CT FFR is recommended. Consider symptom-guided anti-ischemic pharmacotherapy as well as risk factor modification per guideline directed care. Invasive coronary angiography recommended with revascularization per published guideline statements. CAD-RADS 5: Total coronary occlusion (100%). Consider cardiac catheterization or viability assessment. Consider symptom-guided anti-ischemic pharmacotherapy as well as risk factor modification per guideline directed care. Shelda Bruckner, MD Electronically Signed: By: Shelda Bruckner M.D. On: 12/20/2023 15:30   ECHOCARDIOGRAM COMPLETE Result Date: 12/20/2023    ECHOCARDIOGRAM REPORT   Patient Name:   TINEY ZIPPER Date of Exam: 12/20/2023 Medical Rec #:  [id number]            Height:       62.0 in Accession #:    [id number]           Weight:       208.4 lb Date of Birth:  February 19, 1950           BSA:  1.945 m Patient Age:    73 years             BP:           205/94 mmHg Patient Gender: F                    HR:           76 bpm. Exam Location:  [address] Procedure: 2D Echo, 3D Echo, Cardiac Doppler and Color Doppler (Both Spectral            and Color Flow Doppler were utilized during procedure). Indications:    R06.9 DOE  History:        Patient has prior history of Echocardiogram examinations, most                 recent 12/27/2021. Risk Factors:Hypertension and Dyslipidemia.  Sonographer:    Augustin Seals RDCS Referring Phys: [id number] [facility] J PATWARDHAN IMPRESSIONS  1.  Left ventricular ejection fraction, by estimation, is 65 to 70%. Left ventricular ejection fraction by 3D volume is 66 %. The left ventricle has normal function. The left ventricle has no regional wall motion abnormalities. There is mild concentric left ventricular hypertrophy. Left ventricular diastolic parameters are indeterminate.  2. Right ventricular systolic function is normal. The right ventricular size is normal.  3. Trivial mitral valve regurgitation.  4. The aortic valve is tricuspid. Aortic valve regurgitation is not visualized. Aortic valve sclerosis/calcification is present, without any evidence of aortic stenosis.  5. The inferior vena cava is normal in size with greater than 50% respiratory variability, suggesting right atrial pressure of 3 mmHg. FINDINGS  Left Ventricle: Left ventricular ejection fraction, by estimation, is 65 to 70%. Left ventricular ejection fraction by 3D volume is 66 %. The left ventricle has normal function. The left ventricle has no regional wall motion abnormalities. The left ventricular internal cavity size was normal in size. There is mild concentric left ventricular hypertrophy. Left ventricular diastolic parameters are indeterminate. Right Ventricle: The right ventricular size is normal. Right vetricular wall thickness was not assessed. Right ventricular systolic function is normal. Left Atrium: Left atrial size was normal in size. Right Atrium: Right atrial size was normal in size. Pericardium: There is no evidence of pericardial effusion. Mitral Valve: There is mild thickening of the mitral valve leaflet(s). Trivial mitral valve regurgitation. Tricuspid Valve: The tricuspid valve is grossly normal. Tricuspid valve regurgitation is trivial. Aortic Valve: The aortic valve is tricuspid. Aortic valve regurgitation is not visualized. Aortic valve sclerosis/calcification is present, without any evidence of aortic stenosis. Pulmonic Valve: The pulmonic valve was not well  visualized. Pulmonic valve regurgitation is not visualized. Aorta: The aortic root and ascending aorta are structurally normal, with no evidence of dilitation. Venous: The inferior vena cava is normal in size with greater than 50% respiratory variability, suggesting right atrial pressure of 3 mmHg. IAS/Shunts: No atrial level shunt detected by color flow Doppler. Additional Comments: 3D was performed not requiring image post processing on an independent workstation and was normal.  LEFT VENTRICLE PLAX 2D LVIDd:         3.90 cm         Diastology LVIDs:         2.30 cm         LV e' medial:    4.46 cm/s LV PW:         1.20 cm         LV E/e' medial:  20.9 LV IVS:        1.20 cm         LV e' lateral:   6.64 cm/s LVOT diam:     2.00 cm         LV E/e' lateral: 14.0 LV SV:         53 LV SV Index:   27 LVOT Area:     3.14 cm        3D Volume EF                                LV 3D EF:    Left                                             ventricul                                             ar                                             ejection                                             fraction                                             by 3D                                             volume is                                             66 %.                                 3D Volume EF:                                3D EF:        66 %                                LV EDV:       99 ml                                LV ESV:       34 ml  LV SV:        65 ml RIGHT VENTRICLE            IVC RV Basal diam:  3.30 cm    IVC diam: 1.80 cm RV Mid diam:    2.80 cm RV S prime:     9.46 cm/s  PULMONARY VEINS TAPSE (M-mode): 2.0 cm     A Reversal Velocity: 33.90 cm/s                            Diastolic Velocity:  52.90 cm/s                            S/D Velocity:        1.20                            Systolic Velocity:   62.10 cm/s LEFT ATRIUM             Index        RIGHT ATRIUM            Index LA diam:        3.10 cm 1.59 cm/m   RA Area:     13.10 cm LA Vol (A2C):   32.7 ml 16.81 ml/m  RA Volume:   29.20 ml  15.01 ml/m LA Vol (A4C):   29.9 ml 15.37 ml/m LA Biplane Vol: 32.2 ml 16.55 ml/m  AORTIC VALVE LVOT Vmax:   84.20 cm/s LVOT Vmean:  55.400 cm/s LVOT VTI:    0.169 m  AORTA Ao Root diam: 2.90 cm Ao Asc diam:  2.90 cm MITRAL VALVE MV Area (PHT): 3.71 cm     SHUNTS MV Decel Time: 205 msec     Systemic VTI:  0.17 m MV E velocity: 93.00 cm/s   Systemic Diam: 2.00 cm MV A velocity: 111.00 cm/s MV E/A ratio:  0.84 Vina Gull MD Electronically signed by Vina Gull MD Signature Date/Time: 12/20/2023/8:50:05 PM    Final    CT CORONARY FFR DATA PREP & FLUID ANALYSIS Result Date: 12/20/2023 EXAM: CT FFR ANALYSIS CLINICAL DATA:  abnormal imaging of coronary circulation FINDINGS: FFRct analysis was performed on the original cardiac CT angiogram dataset. Diagrammatic representation of the FFRct analysis is provided in a separate PDF document in PACS. This dictation was created using the PDF document and an interactive 3D model of the results. 3D model is not available in the EMR/PACS. Normal FFR range is >0.80. 1. Left Main:  No significant stenosis. FFR = 0.97 2. LAD: No significant stenosis. Proximal FFR = 0.96, Mid FFR = 0.89, Distal FFR = 0.82 3. LCX: No significant stenosis. Proximal FFR = 0.92, Distal FFR = 0.87 4. RCA: Modeled as CTO proximal (near ostial). IMPRESSION: 1. CT FFR analysis did not show any significant stenosis in LAD or LCX. RCA modeled as CTO and cannot be further described by FFR. Electronically Signed   By: Shelda Bruckner M.D.   On: 12/20/2023 15:32   LONG TERM MONITOR (3-14 DAYS) Result Date: 12/13/2023 Zio patch monitor 14 days 11/18/2023 - 12/02/2023: Dominant rhythm: Sinus. HR 52-154 bpm. Avg HR 77 bpm. 0 episodes of SVT. <1% isolated SVE,  couplet/triplets. 1 episode of VT, fastest at 124 bpm for 5 beats. <1% isolated VE,  couplet/triplets. No atrial  fibrillation/atrial flutter/SVT/high grade AV block, sinus pause >3sec noted.  5 patient triggered events, correlated with sinus rhythm.    Disposition   Pt is being discharged home today in good condition.  Follow-up Plans & Appointments   Dr. Elmira 1/26  Discharge Instructions     AMB Referral to Cardiac Rehabilitation - Phase II   Complete by: As directed    Diagnosis: Coronary Stents   After initial evaluation and assessments completed: Virtual Based Care may be provided alone or in conjunction with Phase 2 Cardiac Rehab based on patient barriers.: Yes   Intensive Cardiac Rehabilitation (ICR) [facility] location only OR Traditional Cardiac Rehabilitation (TCR) *If criteria for ICR are not met will enroll in TCR [facility] only): Yes        Discharge Medications   Allergies as of 01/01/2024       Reactions   Food Swelling, Other (See Comments)   CUCUMBERS TONGUE SWELLING   Pitavastatin    myalgias   Pravastatin    Other Reaction(s): aching   Rosuvastatin    Other Reaction(s): aching   Simvastatin    Other Reaction(s): aching   Codeine Nausea Only   Methocarbamol Nausea Only   Tramadol Nausea Only   Patient states she tolerates   Voltaren [diclofenac Sodium] Rash        Medication List     TAKE these medications    acetaminophen  500 MG tablet Commonly known as: TYLENOL  Take 1,000 mg by mouth every 6 (six) hours as needed for moderate pain.   albuterol  108 (90 Base) MCG/ACT inhaler Commonly known as: VENTOLIN  HFA Inhale 1-2 puffs into the lungs every 6 (six) hours as needed for wheezing or shortness of breath.   amLODipine  10 MG tablet Commonly known as: NORVASC  Take 10 mg by mouth daily.   amLODipine  10 MG tablet Commonly known as: NORVASC  Take 1 tablet (10 mg total) by mouth daily.   ammonium lactate 12 % cream Commonly known as: AMLACTIN Apply 1 Application topically every 3 (three) days.   Aspirin  Low Dose 81 MG tablet Generic drug: aspirin   EC Take 1 tablet (81 mg total) by mouth daily. Swallow whole.   betamethasone dipropionate 0.05 % lotion Apply 1 application topically daily as needed (scalp irritation).   Breo Ellipta  100-25 MCG/ACT Aepb Generic drug: fluticasone  furoate-vilanterol Inhale 1 puff into the lungs daily.   carvedilol  12.5 MG tablet Commonly known as: COREG  Take 1 tablet (12.5 mg total) by mouth 2 (two) times daily.   cetirizine  10 MG tablet Commonly known as: ZYRTEC  Take 10 mg by mouth daily.   chlorthalidone  25 MG tablet Commonly known as: HYGROTON  Take 25 mg by mouth daily.   clopidogrel  75 MG tablet Commonly known as: PLAVIX  Take 1 tablet (75 mg total) by mouth daily with breakfast. Start taking on: January 02, 2024   folic acid 1 MG tablet Commonly known as: FOLVITE Take 1 mg by mouth daily.   hydroxypropyl methylcellulose / hypromellose 2.5 % ophthalmic solution Commonly known as: ISOPTO TEARS / GONIOVISC Place 1 drop into both eyes daily as needed for dry eyes.   mupirocin ointment 2 % Commonly known as: BACTROBAN Apply 1 Application topically daily.   nitroGLYCERIN  0.4 MG SL tablet Commonly known as: NITROSTAT  Place 1 tablet (0.4 mg total) under the tongue every 5 (five) minutes as needed for chest pain.   pantoprazole  40 MG tablet Commonly known as: PROTONIX  Take 40 mg by mouth daily.   potassium chloride  10 MEQ tablet Commonly known as: KLOR-CON  Take 10 mEq by mouth daily.  Repatha SureClick 140 MG/ML Soaj Generic drug: Evolocumab Inject 140 mg into the skin every 14 (fourteen) days.   sulfaSALAzine 500 MG EC tablet Commonly known as: AZULFIDINE Take 500 mg by mouth 2 (two) times daily.   telmisartan 80 MG tablet Commonly known as: MICARDIS Take 80 mg by mouth daily.   triamcinolone cream 0.1 % Commonly known as: KENALOG Apply 1 Application topically daily as needed.   Trulicity 1.5 MG/0.5ML Soaj Generic drug: Dulaglutide Inject 1.5 mg into the skin once a  week. Tuesdays   Vitamin D3 50 MCG (2000 UT) Tabs Take 2,000 Units by mouth daily.           Allergies Allergies[1]  Outstanding Labs/Studies    Duration of Discharge Encounter   Greater than 30 minutes including physician time.  Signed, Thom LITTIE Sluder, PA-C 01/01/2024, 3:05 PM      [1]  Allergies Allergen Reactions   Food Swelling and Other (See Comments)    CUCUMBERS TONGUE SWELLING   Pitavastatin     myalgias   Pravastatin     Other Reaction(s): aching   Rosuvastatin     Other Reaction(s): aching   Simvastatin     Other Reaction(s): aching   Codeine Nausea Only   Methocarbamol Nausea Only   Tramadol Nausea Only    Patient states she tolerates   Voltaren [Diclofenac Sodium] Rash   "

## 2024-01-01 NOTE — Interval H&P Note (Signed)
 History and Physical Interval Note:  01/01/2024 11:20 AM  Isabella Oliver  has presented today for surgery, with the diagnosis of cad.  The various methods of treatment have been discussed with the patient and family. After consideration of risks, benefits and other options for treatment, the patient has consented to  Procedures: LEFT HEART CATH AND CORONARY ANGIOGRAPHY (N/A) as a surgical intervention.  The patient's history has been reviewed, patient examined, no change in status, stable for surgery.  I have reviewed the patient's chart and labs.  Questions were answered to the patient's satisfaction.     Raquan Iannone J Koran Seabrook

## 2024-01-01 NOTE — H&P (Signed)
 OV 12/27/2023 copied for documentation   Cardiology Office Note:  .   Date:  01/01/2024  ID:  Isabella Oliver, DOB 06-11-1950, MRN [id number] PCP: Dwight Trula SQUIBB, MD  [facility] Providers Cardiologist:  Newman Lawrence, MD PCP: Dwight Trula SQUIBB, MD  C/C: Chest pain   Isabella Oliver is a 73 y.o. female with hypertension, hyperlipidemia, type 2 DM, b/l carpal tunnel syndrome, now with accelerated angina, obstructive CAD  Discussed the use of AI scribe software for clinical note transcription with the patient, who gave verbal consent to proceed.  History of Present Illness  Patient continues to have episodes of chest pain, sometimes 3 times a day, with minimal activity, relieved with rest.  Discussed recent CT angiogram results with the patient, details below.  Pressure is elevated today.  Appears that she is not taking amlodipine  currently.      Vitals:   01/01/24 0835  BP: (!) 154/64  Pulse: 73  Temp: 98 F (36.7 C)  SpO2: 98%       Review of Systems  Cardiovascular:  Positive for dyspnea on exertion and palpitations. Negative for chest pain, leg swelling and syncope.        Studies Reviewed: SABRA        EKG 11/11/2023: Normal sinus rhythm Right bundle branch block Left anterior fascicular block Bifascicular block Cannot rule out Inferior infarct , age undetermined Possible Anterior infarct , age undetermined When compared with ECG of 23-Oct-2019 14:39, No significant change was found     Regadenoson   Nuclear stress test 2024: There is soft tissue attenuation artifact in the inferior wall without ischemia or scar. Overall LV systolic function is normal without regional wall motion abnormalities. Stress LV EF: 71%.  Nondiagnostic ECG stress. The heart rate response was consistent with Regadenoson .  No previous exam available for comparison. Low risk.    Echocardiogram 2023:  Left ventricle cavity is normal in size and wall thickness.  Normal global  wall motion. Normal LV systolic function with EF 74%. Doppler evidence of  grade I (impaired) diastolic dysfunction, normal LAP.  No significant valvular abnormality.  Estimated pulmonary artery systolic pressure 32 mmHg.  No significant change compared to previous study in 2020.   Labs 11/2023: Chol 172, TG 148, HDL 59, LDL 87 Cr 1.03  Labs 07/2023: Chol 178, TG 144, HDL 56, LDL 97 HbA1C 6.9%  2023: Hb 12.1 Cr 1.0    Physical Exam Vitals and nursing note reviewed.  Constitutional:      General: She is not in acute distress. Neck:     Vascular: No JVD.  Cardiovascular:     Rate and Rhythm: Normal rate and regular rhythm.     Heart sounds: Normal heart sounds. No murmur heard. Pulmonary:     Effort: Pulmonary effort is normal.     Breath sounds: Normal breath sounds. No wheezing or rales.  Musculoskeletal:     Right lower leg: No edema.     Left lower leg: No edema.     VISIT DIAGNOSES: No diagnosis found.     Isabella Oliver is a 73 y.o. female with hypertension, hyperlipidemia, type 2 DM, b/l carpal tunnel syndrome, now with accelerated angina, obstructive CAD Assessment & Plan  Chest pain: Multiple episodes with minimal exertion, on optimal medical therapy.  Coronary CT angiogram suggests either critical proximal RCA stenosis, or CTO.  CTO less likely given absence of collaterals.  Given her continued symptoms, recommend coronary angiography and possible intervention.  Continue aspirin  81 mg daily, Repatha, see below regarding blood pressure control.  Hypertension: Uncontrolled.  She is not taking amlodipine  10 mg daily previously prescribed, resume amlodipine  10 mg daily.  Continue rest of the antihypertensive therapy.  Mixed hyperlipidemia: Continue Repatha.  Informed Consent   Shared Decision Making/Informed Consent The risks [stroke (1 in 1000), death (1 in 1000), kidney failure [usually temporary] (1 in 500), bleeding (1 in 200),  allergic reaction [possibly serious] (1 in 200)], benefits (diagnostic support and management of coronary artery disease) and alternatives of a cardiac catheterization were discussed in detail with Ms. Glynn and she is willing to proceed.        Meds ordered this encounter  Medications   aspirin  chewable tablet 81 mg   free water  500 mL   sodium chloride  flush (NS) 0.9 % injection 3 mL   sodium chloride  flush (NS) 0.9 % injection 3 mL   0.9 %  sodium chloride  infusion     F/u in 6-8 weeks  Signed, Newman JINNY Lawrence, MD

## 2024-01-01 NOTE — Discharge Instructions (Addendum)
 Information about your medication: Plavix  (anti-platelet agent)  Generic Name (Brand): clopidogrel  (Plavix ), once daily medication  PURPOSE: You are taking this medication along with aspirin  to lower your chance of having a heart attack, stroke, or blood clots in your heart stent. These can be fatal. Plavix  and aspirin  help prevent platelets from sticking together and forming a clot that can block an artery or your stent.   Common SIDE EFFECTS you may experience include: bruising or bleeding more easily, shortness of breath  Do not stop taking PLAVIX  without talking to the doctor who prescribes it for you. People who are treated with a stent and stop taking Plavix  too soon, have a higher risk of getting a blood clot in the stent, having a heart attack, or dying. If you stop Plavix  because of bleeding, or for other reasons, your risk of a heart attack or stroke may increase.   Tell all of your doctors and dentists that you are taking Plavix . They should talk to the doctor who prescribed Plavix  for you before you have any surgery or invasive procedure.   Contact your health care provider if you experience: severe or uncontrollable bleeding, pink/red/brown urine, vomiting blood or vomit that looks like coffee grounds, red or black stools (looks like tar), coughing up blood or blood clots ----------------------------------------------------------------------------------------------------------------------     Radial Site Care  The following information offers guidance on how to care for yourself after your procedure. Your health care provider may also give you more specific instructions. If you have problems or questions, contact your health care provider. What can I expect after the procedure? After the procedure, it is common to have bruising and tenderness in the incision area. Follow these instructions at home: Incision site care  Follow instructions from your health care provider about  how to take care of your incision site. Make sure you: Wash your hands with soap and water  for at least 20 seconds before and after you change your bandage (dressing). If soap and water  are not available, use hand sanitizer. Remove your dressing in 24 hours. Leave stitches (sutures), skin glue, or adhesive strips in place. These skin closures may need to stay in place for 2 weeks or longer. If adhesive strip edges start to loosen and curl up, you may trim the loose edges. Do not remove adhesive strips completely unless your health care provider tells you to do that. Do not take baths, swim, or use a hot tub for at least 1 week. You may shower 24 hours after the procedure or as told by your health care provider. Remove the dressing and gently wash the incision area with plain soap and water . Pat the area dry with a clean towel. Do not rub the site. That could cause bleeding. Do not apply powder or lotion to the site. Check your incision site every day for signs of infection. Check for: Redness, swelling, or pain. Fluid or blood. Warmth. Pus or a bad smell. Activity For 24 hours after the procedure, or as directed by your health care provider: Do not flex or bend the affected arm. Do not push or pull heavy objects with the affected arm. Do not operate machinery or power tools. Do not drive. You should not drive yourself home from the hospital or clinic if you go home during that time period. You may drive 24 hours after the procedure unless your health care provider tells you not to. Do not lift anything that is heavier than 10 lb (4.5 kg),  or the limit that you are told, until your health care provider says that it is safe. Return to your normal activities as told by your health care provider. Ask your health care provider what activities are safe for you and when you can return to work. If you were given a sedative during the procedure, it can affect you for several hours. Do not drive or  operate machinery until your health care provider says that it is safe. General instructions Take over-the-counter and prescription medicines only as told by your health care provider. If you will be going home right after the procedure, plan to have a responsible adult care for you for the time you are told. This is important. Keep all follow-up visits. This is important. Contact a health care provider if: You have a fever or chills. You have any of these signs of infection at your incision site: Redness, swelling, or pain. Fluid or blood. Warmth. Pus or a bad smell. Get help right away if: The incision area swells very fast. The incision area is bleeding, and the bleeding does not stop when you hold steady pressure on the area. Your arm or hand becomes pale, cool, tingly, or numb. These symptoms may represent a serious problem that is an emergency. Do not wait to see if the symptoms will go away. Get medical help right away. Call your local emergency services (911 in the [place] Do not drive yourself to the hospital. Summary After the procedure, it is common to have bruising and tenderness at the incision site. Follow instructions from your health care provider about how to take care of your radial site incision. Check the incision every day for signs of infection. Do not lift anything that is heavier than 10 lb (4.5 kg), or the limit that you are told, until your health care provider says that it is safe. Get help right away if the incision area swells very fast, you have bleeding at the incision site that will not stop, or your arm or hand becomes pale, cool, or numb. This information is not intended to replace advice given to you by your health care provider. Make sure you discuss any questions you have with your health care provider. Document Revised: 02/14/2020 Document Reviewed: 02/14/2020 Elsevier Patient Education  2024 [employer].

## 2024-01-01 NOTE — Progress Notes (Addendum)
 1345- Patient came back to short stay after a cardiac cath, the patient was confused, she did not know where she was, what procedure she had done, or the date, or her sons name. She was also having trouble finding her words. In light of this change from pre procedure baseline, RRT Called to evaluate. MD Patwardhan notified and came to bedside.

## 2024-01-01 NOTE — Progress Notes (Signed)
 Discharge instructions reviewed with patient and son at bedside. Denies questions concerns. Pt tolerated PO intake. Pt was able to void without difficulty. Seen by APP, Cardiac Rehab, and Pharmacy. Incision site remains clean dry and intact. No s/s of complications. PT escorted from the unit via wheel chair to personal vehicle.

## 2024-01-01 NOTE — Significant Event (Addendum)
 Rapid Response Event Note   Reason for Call :  Family noted patient not acting like herself, post-procedure. Upon primary RN assessment, RN had concern for patient not being able to find her words.  Patient s/p LHC. She received a total of 75mcg Fentanyl  and 2mg  Versed  IV in procedure. Last dose 1158.   Initial Focused Assessment:  Patient alert. NIH 0, see flowsheet for VS and full NIHSS. Exam required repeating questions and focusing patient's attention, however patient answered questions appropriately.   VS: BP 161/57, HR 77, RR 18, SpO2 98% on room air CBG: 161  Interventions:  No intervention  Plan of Care:  Per attending  Event Summary:  MD Notified: per primary RN Call Time: 1351 Arrival Time: 1355 End Time: 1410  Leonor LITTIE Danker, RN

## 2024-01-02 ENCOUNTER — Encounter (HOSPITAL_COMMUNITY): Payer: Self-pay | Admitting: Cardiology

## 2024-01-03 LAB — POCT ACTIVATED CLOTTING TIME
Activated Clotting Time: 250 s
Activated Clotting Time: 353 s
Activated Clotting Time: 460 s
Activated Clotting Time: 691 s

## 2024-01-07 ENCOUNTER — Telehealth (HOSPITAL_COMMUNITY): Payer: Self-pay

## 2024-01-07 NOTE — Telephone Encounter (Signed)
 Attempted to call patient in regards to Cardiac Rehab - LM on VM

## 2024-01-07 NOTE — Telephone Encounter (Signed)
 Referral recv'd and verified for MD signature. Follow up appointment is on 1/26. Insurance benefits and eligibility TBD.

## 2024-02-03 ENCOUNTER — Ambulatory Visit: Admitting: Cardiology

## 2024-02-13 ENCOUNTER — Other Ambulatory Visit (HOSPITAL_COMMUNITY): Payer: Self-pay

## 2024-02-13 ENCOUNTER — Ambulatory Visit: Admitting: Cardiology

## 2024-02-13 VITALS — BP 140/62 | HR 74 | Resp 16 | Ht 62.0 in | Wt 216.1 lb

## 2024-02-13 DIAGNOSIS — I25118 Atherosclerotic heart disease of native coronary artery with other forms of angina pectoris: Secondary | ICD-10-CM | POA: Diagnosis not present

## 2024-02-13 DIAGNOSIS — I10 Essential (primary) hypertension: Secondary | ICD-10-CM | POA: Diagnosis not present

## 2024-02-13 DIAGNOSIS — E782 Mixed hyperlipidemia: Secondary | ICD-10-CM | POA: Diagnosis not present

## 2024-02-13 MED ORDER — ASPIRIN 81 MG PO TBEC
81.0000 mg | DELAYED_RELEASE_TABLET | Freq: Every day | ORAL | 2 refills | Status: AC
  Filled 2024-02-13: qty 90, 90d supply, fill #0

## 2024-02-13 MED ORDER — CARVEDILOL 25 MG PO TABS
25.0000 mg | ORAL_TABLET | Freq: Two times a day (BID) | ORAL | 3 refills | Status: AC
  Filled 2024-02-13: qty 180, 90d supply, fill #0

## 2024-02-13 MED ORDER — CLOPIDOGREL BISULFATE 75 MG PO TABS
75.0000 mg | ORAL_TABLET | Freq: Every day | ORAL | 3 refills | Status: AC
  Filled 2024-02-13: qty 90, 90d supply, fill #0

## 2024-02-13 NOTE — Progress Notes (Signed)
 " Cardiology Office Note:  .   Date:  02/13/2024  ID:  Isabella Oliver, DOB February 07, 1950, MRN [id number] PCP: Dwight Trula SQUIBB, MD   [facility] Providers Cardiologist:  Newman Lawrence, MD PCP: Dwight Trula SQUIBB, MD  Chief Complaint  Patient presents with   Coronary artery disease of native artery of native heart wi   Follow-up     Isabella Oliver is a 74 y.o. female with hypertension, hyperlipidemia, type 2 DM, CAD, b/l carpal tunnel syndrome   History of Present Illness  Patient underwent successful ostial RCA PCI.  Immediately after PCI, she had an near resolution of her chest pain and shortness of breath.  However, recently, she has felt some shortness of breath again.  She has not undergone cardiac rehab.  Blood pressure is improving, but still remains slightly suboptimal      Vitals:   02/13/24 1321  BP: (!) 140/62  Pulse: 74  Resp: 16  SpO2: 95%        Review of Systems  Cardiovascular:  Positive for dyspnea on exertion. Negative for chest pain, leg swelling, palpitations and syncope.        Studies Reviewed: SABRA        EKG 02/13/2024: Normal sinus rhythm Right bundle branch block Left anterior fascicular block Bifascicular block When compared with ECG of 01-Jan-2024 08:35, Nonspecific T wave abnormality no longer evident in Inferior leads  Coronary angiography & intervention 01/01/2024: LM: Normal LAD: Minimal luminal irregularities Lcx: No significant disease RCA: Dominant vessel          Ostial-proximal 95% stenosis          Proximal 30% stenosis          Distal 40% stenosis   LVEDP 24 mmHg   Successful percutaneous coronary intervention ostial-prox RCA        Intravascular ultrasound (IVUS)        PTCA and stent placement 4.0 X 16 mm Synergy Megatron drug-eluting stent        Postdilatation using 4.5 x 12 mm [place] balloon up to 14 atm        Stenosis 95%-->0%, TIMI flow II-->III          Recommend uninterrupted dual  antiplatelet therapy with Aspirin  81mg  daily and Clopidogrel  75mg  daily.   Recommend 1 year DAPT given ostial location, followed by plavix  monotherapy    Regadenoson   Nuclear stress test 2024: There is soft tissue attenuation artifact in the inferior wall without ischemia or scar. Overall LV systolic function is normal without regional wall motion abnormalities. Stress LV EF: 71%.  Nondiagnostic ECG stress. The heart rate response was consistent with Regadenoson .  No previous exam available for comparison. Low risk.    Echocardiogram 2023:  Left ventricle cavity is normal in size and wall thickness. Normal global  wall motion. Normal LV systolic function with EF 74%. Doppler evidence of  grade I (impaired) diastolic dysfunction, normal LAP.  No significant valvular abnormality.  Estimated pulmonary artery systolic pressure 32 mmHg.  No significant change compared to previous study in 2020.   Labs 11/2023: Chol 172, TG 148, HDL 59, LDL 87 Cr 1.03  Labs 07/2023: Chol 178, TG 144, HDL 56, LDL 97 HbA1C 6.9%  2023: Hb 12.1 Cr 1.0    Physical Exam Vitals and nursing note reviewed.  Constitutional:      General: She is not in acute distress. Neck:     Vascular: No JVD.  Cardiovascular:     Rate  and Rhythm: Normal rate and regular rhythm.     Heart sounds: Normal heart sounds. No murmur heard. Pulmonary:     Effort: Pulmonary effort is normal.     Breath sounds: Normal breath sounds. No wheezing or rales.  Musculoskeletal:     Right lower leg: No edema.     Left lower leg: No edema.     VISIT DIAGNOSES:   ICD-10-CM   1. Coronary artery disease of native artery of native heart with stable angina pectoris  I25.118 EKG 12-Lead    2. Essential hypertension  I10     3. Mixed hyperlipidemia  E78.2          Isabella Oliver is a 74 y.o. female with hypertension, hyperlipidemia, type 2 DM, b/l carpal tunnel syndrome, now with accelerated angina, obstructive  CAD Assessment & Plan  CAD: Ostial RCA PCI with improvement in anginal symptoms, status of exertional dyspnea. Recommend cardiac rehab. Recommend aspirin  and Plavix , for 1 year, followed by Plavix  monotherapy. Started on Repatha, check lipid panel in 04/2024. Increase carvedilol  to 25 mg twice daily for better blood pressure control. Continue rest of the antihypertensive therapy.    Meds ordered this encounter  Medications   aspirin  EC 81 MG tablet    Sig: Take 1 tablet (81 mg total) by mouth daily. Swallow whole.    Dispense:  90 tablet    Refill:  2   clopidogrel  (PLAVIX ) 75 MG tablet    Sig: Take 1 tablet (75 mg total) by mouth daily with breakfast.    Dispense:  90 tablet    Refill:  3   carvedilol  (COREG ) 25 MG tablet    Sig: Take 1 tablet (25 mg total) by mouth 2 (two) times daily.    Dispense:  180 tablet    Refill:  3     F/u in 3 months  Signed, Newman JINNY Lawrence, MD  "

## 2024-02-13 NOTE — Patient Instructions (Signed)
 Medication Instructions:  Meds ordered this encounter  Medications   aspirin  EC 81 MG tablet    Sig: Take 1 tablet (81 mg total) by mouth daily. Swallow whole.    Dispense:  90 tablet    Refill:  2   clopidogrel  (PLAVIX ) 75 MG tablet    Sig: Take 1 tablet (75 mg total) by mouth daily with breakfast.    Dispense:  90 tablet    Refill:  3   carvedilol  (COREG ) 25 MG tablet    Sig: Take 1 tablet (25 mg total) by mouth 2 (two) times daily.    Dispense:  180 tablet    Refill:  3     *If you need a refill on your cardiac medications before your next appointment, please call your pharmacy*  Lab Work: LIPID PANEL IN 04/2024  If you have labs (blood work) drawn today and your tests are completely normal, you will receive your results only by: MyChart Message (if you have MyChart) OR A paper copy in the mail If you have any lab test that is abnormal or we need to change your treatment, we will call you to review the results.   Cardiac Rehab referral    Follow-Up: At [facility], you and your health needs are our priority.  As part of our continuing mission to provide you with exceptional heart care, our providers are all part of one team.  This team includes your primary Cardiologist (physician) and Advanced Practice Providers or APPs (Physician Assistants and Nurse Practitioners) who all work together to provide you with the care you need, when you need it.  Your next appointment:   05/2024   Provider:   Dr. El[place] or APP    We recommend signing up for the patient portal called MyChart.  Sign up information is provided on this After Visit Summary.  MyChart is used to connect with patients for Virtual Visits (Telemedicine).  Patients are able to view lab/test results, encounter notes, upcoming appointments, etc.  Non-urgent messages can be sent to your provider as well.   To learn more about what you can do with MyChart, go to [email or web address].   Other  Instructions

## 2024-03-04 ENCOUNTER — Ambulatory Visit: Admitting: Cardiology
